# Patient Record
Sex: Female | Born: 1970 | Hispanic: No | Marital: Married | Smoking: Never smoker
Health system: Southern US, Community
[De-identification: ages and names within clinical notes are randomized; demographics above are authoritative.]

## PROBLEM LIST (undated history)

## (undated) DIAGNOSIS — R112 Nausea with vomiting, unspecified: Secondary | ICD-10-CM

## (undated) DIAGNOSIS — D649 Anemia, unspecified: Secondary | ICD-10-CM

## (undated) DIAGNOSIS — D279 Benign neoplasm of unspecified ovary: Secondary | ICD-10-CM

## (undated) DIAGNOSIS — M25569 Pain in unspecified knee: Secondary | ICD-10-CM

## (undated) DIAGNOSIS — R102 Pelvic and perineal pain: Secondary | ICD-10-CM

## (undated) DIAGNOSIS — R109 Unspecified abdominal pain: Secondary | ICD-10-CM

## (undated) DIAGNOSIS — Z9889 Other specified postprocedural states: Secondary | ICD-10-CM

## (undated) DIAGNOSIS — R55 Syncope and collapse: Secondary | ICD-10-CM

## (undated) DIAGNOSIS — N939 Abnormal uterine and vaginal bleeding, unspecified: Secondary | ICD-10-CM

## (undated) DIAGNOSIS — T4145XA Adverse effect of unspecified anesthetic, initial encounter: Secondary | ICD-10-CM

## (undated) DIAGNOSIS — N200 Calculus of kidney: Secondary | ICD-10-CM

## (undated) DIAGNOSIS — G8929 Other chronic pain: Secondary | ICD-10-CM

## (undated) HISTORY — DX: Unspecified abdominal pain: R10.9

## (undated) HISTORY — PX: ABDOMINAL HYSTERECTOMY: SHX81

## (undated) HISTORY — DX: Calculus of kidney: N20.0

## (undated) HISTORY — DX: Abnormal uterine and vaginal bleeding, unspecified: N93.9

## (undated) HISTORY — DX: Pelvic and perineal pain: R10.2

## (undated) HISTORY — PX: HAND TENODESIS: SHX977

## (undated) HISTORY — DX: Syncope and collapse: R55

## (undated) HISTORY — DX: Benign neoplasm of unspecified ovary: D27.9

## (undated) HISTORY — DX: Other chronic pain: G89.29

---

## 2009-03-10 ENCOUNTER — Emergency Department: Payer: Self-pay | Admitting: Emergency Medicine

## 2010-08-15 ENCOUNTER — Ambulatory Visit: Payer: Self-pay | Admitting: Specialist

## 2011-02-02 ENCOUNTER — Ambulatory Visit: Payer: Self-pay | Admitting: Specialist

## 2011-02-05 LAB — PATHOLOGY REPORT

## 2013-02-08 ENCOUNTER — Inpatient Hospital Stay (HOSPITAL_COMMUNITY)
Admission: AD | Admit: 2013-02-08 | Discharge: 2013-02-08 | Disposition: A | Discharge status: Hospice | Payer: Federal, State, Local not specified - PPO | Source: Ambulatory Visit | Attending: Obstetrics & Gynecology | Admitting: Obstetrics & Gynecology

## 2013-02-08 DIAGNOSIS — N938 Other specified abnormal uterine and vaginal bleeding: Secondary | ICD-10-CM | POA: Insufficient documentation

## 2013-02-08 LAB — CBC
HCT: 24.3 % — ABNORMAL LOW (ref 36.0–46.0)
MCHC: 30.5 g/dL (ref 30.0–36.0)
RDW: 25.2 % — ABNORMAL HIGH (ref 11.5–15.5)
WBC: 6.8 10*3/uL (ref 4.0–10.5)

## 2013-02-08 LAB — POCT PREGNANCY, URINE: Preg Test, Ur: NEGATIVE

## 2013-02-08 MED ORDER — MEGESTROL ACETATE 40 MG PO TABS: 120.0000 mg | ORAL_TABLET | Freq: Every day | ORAL | Status: DC

## 2013-02-08 NOTE — MAU Note (Signed)
Pt states she had surgery on the 27th for excess vag bleeding and returned to [facility] on Mar 6th for continued bleeding.  Was told to come to [facility] if bleeding continued.  Says she is passing clots and changing pad 7X /day.

## 2013-02-08 NOTE — MAU Note (Signed)
Pt report she has been bleeding since November. Had Surgery (pt unsure what kind of surgery possibly a  D&C) on 3/6 at [facility]. Stated she still is having vaginal bleeding and feels weak. Went back to [facility] ER on Friday because she felt weak  They told her everything was OK. Pt still feeling weak and having bleeding. Pt reprots chnging her pad about 7 times/ day with clots (quarte/halfdollar sized)

## 2013-02-08 NOTE — MAU Provider Note (Signed)
History     CSN: [id number]  Arrival date and time: 02/08/13 1756   First Provider Initiated Contact with Patient 02/08/13 1910      Chief Complaint  Patient presents with  . Vaginal Bleeding   HPI [name] is a 42 y.o. female who presents to MAU today with complaint of vaginal bleeding. The patient states that she had surgery to help with heavy vaginal bleeding on 01/29/13 at [facility]. She returned to [facility] for evaluation in the ED on 02/05/13 because she was still bleeding and feeling weak. She was told that her bloodwork looked ok and she should follow-up in the office. She states that she has been unable to contact anyone in her GYN office for assistance. She states that today she is still bleeding, with some clots, and having lower abdominal pain. She rates her pain at 6/10 now. She is taking ibuprofen 600 mg, iron and provera that were prescribed in the ED. She feels weak, dizzy and tired.   OB History   No data available      No past medical history on file.  No past surgical history on file.  No family history on file.  History  Substance Use Topics  . Smoking status: Not on file  . Smokeless tobacco: Not on file  . Alcohol Use: Not on file    Allergies: No Known Allergies  Prescriptions prior to admission  Medication Sig Dispense Refill  . ferrous sulfate 325 (65 FE) MG tablet Take 325 mg by mouth daily.      Marland Kitchen ibuprofen (ADVIL,MOTRIN) 600 MG tablet Take 600 mg by mouth every 6 (six) hours as needed for pain.      . [DISCONTINUED] medroxyPROGESTERone (PROVERA) 10 MG tablet Take 10 mg by mouth daily.        Review of Systems  Constitutional: Positive for malaise/fatigue. Negative for fever.  Gastrointestinal: Positive for abdominal pain. Negative for nausea, vomiting, diarrhea and constipation.  Genitourinary: Negative for dysuria, urgency and frequency.       + vaginal bleeding  Musculoskeletal: Negative for back pain.  Neurological: Positive for  dizziness and weakness. Negative for loss of consciousness.   Physical Exam   Blood pressure 124/64, pulse 75, temperature 99.1 F (37.3 C), temperature source Oral, resp. rate 16, height 5' (1.524 m), weight 113 lb 12.8 oz (51.619 kg).  Physical Exam  Constitutional: She is oriented to person, place, and time. She appears well-developed and well-nourished. No distress.  HENT:  Head: Normocephalic and atraumatic.  Cardiovascular: Normal rate, regular rhythm and normal heart sounds.   Respiratory: Effort normal and breath sounds normal. No respiratory distress.  GI: Soft. Bowel sounds are normal. She exhibits no distension and no mass. There is tenderness (mild tenderness to palpation of the lower abdomen). There is no rebound and no guarding.  Genitourinary:  Small amount of bleeding present on pad after ~ 30 minutes  Neurological: She is alert and oriented to person, place, and time.  Skin: Skin is warm and dry. No erythema. No pallor.  Psychiatric: She has a normal mood and affect.   Results for orders placed during the hospital encounter of 02/08/13 (from the past 24 hour(s))  POCT PREGNANCY, URINE     Status: None   Collection Time    02/08/13  6:29 PM      Result Value Range   Preg Test, Ur NEGATIVE  NEGATIVE  CBC     Status: Abnormal   Collection Time  02/08/13  7:30 PM      Result Value Range   WBC 6.8  4.0 - 10.5 K/uL   RBC 3.35 (*) 3.87 - 5.11 MIL/uL   Hemoglobin 7.4 (*) 12.0 - 15.0 g/dL   HCT 40.9 (*) 81.1 - 91.4 %   MCV 72.5 (*) 78.0 - 100.0 fL   MCH 22.1 (*) 26.0 - 34.0 pg   MCHC 30.5  30.0 - 36.0 g/dL   RDW 78.2 (*) 95.6 - 21.3 %   Platelets 308  150 - 400 K/uL    MAU Course  Procedures None  MDM Discussed patient with Dr Despina Hidden. He recommends 3 (40 mg) tablets of Megace daily until follow-up with surgeon. Call surgeon ASAP to make an appointment.   Assessment and Plan  A: DUB  P: Discharge home Rx for Megace sent to patient's pharmacy Patient  encouraged to increase ferrous sulfate to TID Recommended patient fill previously prescribed Rx for colace and take scheduled once she increases her iron Patient encouraged to call surgeon's office and schedule follow-up ASAP Patient advised to go to [facility] or MAU if her symptoms were to change or worsen  Freddi Starr, PA-C  02/08/2013, 8:22 PM

## 2013-02-25 DIAGNOSIS — N926 Irregular menstruation, unspecified: Secondary | ICD-10-CM | POA: Insufficient documentation

## 2013-10-08 DIAGNOSIS — R55 Syncope and collapse: Secondary | ICD-10-CM

## 2013-10-08 DIAGNOSIS — R109 Unspecified abdominal pain: Secondary | ICD-10-CM

## 2013-10-08 HISTORY — DX: Unspecified abdominal pain: R10.9

## 2013-10-08 HISTORY — DX: Syncope and collapse: R55

## 2015-01-23 DIAGNOSIS — D270 Benign neoplasm of right ovary: Secondary | ICD-10-CM | POA: Diagnosis present

## 2015-01-23 DIAGNOSIS — D4959 Neoplasm of unspecified behavior of other genitourinary organ: Secondary | ICD-10-CM | POA: Insufficient documentation

## 2015-01-25 DIAGNOSIS — N200 Calculus of kidney: Secondary | ICD-10-CM | POA: Insufficient documentation

## 2015-01-25 HISTORY — DX: Calculus of kidney: N20.0

## 2015-02-04 ENCOUNTER — Ambulatory Visit: Payer: Self-pay | Admitting: Unknown Physician Specialty

## 2015-03-24 DIAGNOSIS — N939 Abnormal uterine and vaginal bleeding, unspecified: Secondary | ICD-10-CM

## 2015-03-24 HISTORY — DX: Abnormal uterine and vaginal bleeding, unspecified: N93.9

## 2015-04-06 DIAGNOSIS — G8929 Other chronic pain: Secondary | ICD-10-CM | POA: Diagnosis present

## 2015-04-06 DIAGNOSIS — R102 Pelvic and perineal pain: Secondary | ICD-10-CM

## 2015-04-06 DIAGNOSIS — IMO0002 Reserved for concepts with insufficient information to code with codable children: Secondary | ICD-10-CM | POA: Diagnosis present

## 2015-04-06 DIAGNOSIS — D279 Benign neoplasm of unspecified ovary: Secondary | ICD-10-CM | POA: Diagnosis present

## 2015-04-06 DIAGNOSIS — N920 Excessive and frequent menstruation with regular cycle: Secondary | ICD-10-CM | POA: Diagnosis present

## 2015-04-06 HISTORY — DX: Benign neoplasm of unspecified ovary: D27.9

## 2015-04-06 HISTORY — DX: Pelvic and perineal pain: R10.2

## 2015-04-06 HISTORY — DX: Other chronic pain: G89.29

## 2015-04-06 NOTE — H&P (Addendum)
Gail [name] is a 44 y.o. female scheduled for a LAVH / BSO   . Pt with a 2 yr h/o of pelvic pain bilateral Which is worsening recently . Pt states that she has worsening menorrhagia and bleeds 2-3 x/ month and passes large clots . Records state that she underwent ablation ( ED records) but pt denies . Several pelvic u/s and ctscan that show probable rt dermoid and possible adhesed left ovary to posterior utx . Pt has not engaged in sexual activity for 2 yrs because of bleeding  EMBX : negative   Past Medical History:  has a past medical history of Gallstones and Anemia.  Past Surgical History:  has past surgical history that includes Cesarean section; Hysteroscopy; Dilation and curettage of uterus; and Endometrial ablation. Family History: family history includes Hypertension in her brother, father, mother, and sister. Social History:  reports that she has never smoked. She does not have any smokeless tobacco history on file. She reports that she does not drink alcohol or use illicit drugs. OB/GYN History:  OB History    Gravida Para Term Preterm AB TAB SAB Ectopic Multiple Living   2 2 2       1       Allergies: is allergic to ciprodex. Medications:  Current outpatient prescriptions:  . gabapentin (NEURONTIN) 100 MG capsule, , Disp: , Rfl: 2 . oxyCODONE-acetaminophen (PERCOCET) 5-325 mg tablet, , Disp: , Rfl: 0 . sulfamethoxazole-trimethoprim (BACTRIM DS) 800-160 mg tablet, Take 1 tablet by mouth 2 (two) times daily., Disp: , Rfl:   Review of Systems: General: No fatigue or weight loss Eyes:No vision changes Ears:No hearing difficulty Respiratory: No cough or shortness of breath Pulmonary: No asthma or shortness of breath Cardiovascular: No chest pain, palpitations, dyspnea on exertion Gastrointestinal: No abdominal bloating,  chronic diarrhea, constipations, masses, pain or hematochezia Genitourinary:No hematuria, dysuria, abnormal vaginal discharge, pelvic pain, Menometrorrhagia Lymphatic:No swollen lymph nodes Musculoskeletal:No muscle weakness Neurologic:No extremity weakness, syncope, seizure disorder Psychiatric:No history of depression, delusions or suicidal/homicidal ideation   Exam:   Filed Vitals:   03/25/15 1104  BP: 131/84  Pulse: 62    Body mass index is 22.12 kg/(m^2).   WDWN asian female in NAD  Lungs: CTA  CV : RRR without murmur   Neck: no thyromegaly Abdomen: soft , no mass, normal active bowel sounds, non-tender, no rebound tenderness Pelvic: tanner stage 5 ,  External genitalia: vulva /labia no lesions Urethra: no prolapse Vagina: 3 cc blood  Cervix: no lesions, no cervical motion tenderness  Uterus: normal size shape and contour, tender Adnexa: no mass, bilat tender  Rectovaginal:deferred   Impression:   The primary encounter diagnosis was Female pelvic pain. Diagnoses of Menorrhagia with irregular cycle and Dermoid cyst of ovary, right were also pertinent to this visit.    Plan:    Recommend definitive surgery ie LAVH/ BSO Benefits and risks to surgery: The proposed benefit of the surgery has been discussed with the patient. The possible risks include, but are not limited to: organ injury to the bowel , bladder, ureters, and major blood vessels and nerves. There is a possibility of additional surgeries resulting from these injuries. There is also the risk of blood transfusion and the need to receive blood products during or after the procedure which may rarely lead to HIV or Hepatitis C infection. There is a risk of developing a deep venous thrombosis or a pulmonary embolism . There is the possibility of wound infection and also anesthetic complications, even  the  rare possibility of death. The patient understands these risks and wishes to proceed. All questions have been answered . Pt is aware of surgical menopause with removal of ovaries and the potential risks that may accompany.   Caroline Sauger, MD

## 2015-04-07 ENCOUNTER — Encounter
Admission: RE | Admit: 2015-04-07 | Discharge: 2015-04-07 | Disposition: A | Discharge status: Home / Self Care | Payer: Federal, State, Local not specified - PPO | Source: Ambulatory Visit | Attending: Obstetrics and Gynecology | Admitting: Obstetrics and Gynecology

## 2015-04-07 ENCOUNTER — Other Ambulatory Visit: Payer: Self-pay

## 2015-04-07 DIAGNOSIS — N920 Excessive and frequent menstruation with regular cycle: Secondary | ICD-10-CM | POA: Insufficient documentation

## 2015-04-07 DIAGNOSIS — R079 Chest pain, unspecified: Secondary | ICD-10-CM | POA: Diagnosis not present

## 2015-04-07 DIAGNOSIS — N949 Unspecified condition associated with female genital organs and menstrual cycle: Secondary | ICD-10-CM | POA: Insufficient documentation

## 2015-04-07 DIAGNOSIS — G8929 Other chronic pain: Secondary | ICD-10-CM | POA: Insufficient documentation

## 2015-04-07 DIAGNOSIS — Z01812 Encounter for preprocedural laboratory examination: Secondary | ICD-10-CM | POA: Diagnosis present

## 2015-04-07 DIAGNOSIS — Z01810 Encounter for preprocedural cardiovascular examination: Secondary | ICD-10-CM | POA: Insufficient documentation

## 2015-04-07 DIAGNOSIS — N941 Dyspareunia: Secondary | ICD-10-CM | POA: Diagnosis not present

## 2015-04-07 HISTORY — DX: Anemia, unspecified: D64.9

## 2015-04-07 HISTORY — DX: Adverse effect of unspecified anesthetic, initial encounter: T41.45XA

## 2015-04-07 LAB — CBC
HCT: 31.8 % — ABNORMAL LOW (ref 35.0–47.0)
HEMOGLOBIN: 9.9 g/dL — AB (ref 12.0–16.0)
MCH: 24.6 pg — AB (ref 26.0–34.0)
MCHC: 31.2 g/dL — ABNORMAL LOW (ref 32.0–36.0)
MCV: 78.7 fL — ABNORMAL LOW (ref 80.0–100.0)
PLATELETS: 310 10*3/uL (ref 150–440)
RBC: 4.04 MIL/uL (ref 3.80–5.20)
RDW: 14.4 % (ref 11.5–14.5)
WBC: 5.5 10*3/uL (ref 3.6–11.0)

## 2015-04-07 LAB — BASIC METABOLIC PANEL
Anion gap: 5 (ref 5–15)
BUN: 12 mg/dL (ref 6–20)
CALCIUM: 9.1 mg/dL (ref 8.9–10.3)
CHLORIDE: 107 mmol/L (ref 101–111)
CO2: 27 mmol/L (ref 22–32)
CREATININE: 0.74 mg/dL (ref 0.44–1.00)
GFR calc Af Amer: >60 mL/min (ref >60)
Glucose, Bld: 91 mg/dL (ref 65–99)
POTASSIUM: 3.9 mmol/L (ref 3.5–5.1)
SODIUM: 139 mmol/L (ref 135–145)

## 2015-04-07 LAB — TYPE AND SCREEN
ABO/RH(D): O POS
Antibody Screen: NEGATIVE

## 2015-04-10 LAB — ABO/RH: ABO/RH(D): O POS

## 2015-04-11 NOTE — OR Nursing (Addendum)
ekg to anesthesia 04/07/15 EKG ok per anesthesia and message left for Pam at Dr Ouida Sills

## 2015-04-15 ENCOUNTER — Encounter
Admission: RE | Disposition: A | Discharge status: Home / Self Care | Payer: Self-pay | Source: Ambulatory Visit | Attending: Obstetrics and Gynecology

## 2015-04-15 ENCOUNTER — Inpatient Hospital Stay: Payer: Federal, State, Local not specified - PPO | Admitting: Anesthesiology

## 2015-04-15 ENCOUNTER — Observation Stay
Admission: RE | Admit: 2015-04-15 | Discharge: 2015-04-16 | Disposition: A | Discharge status: Home / Self Care | Payer: Federal, State, Local not specified - PPO | Source: Ambulatory Visit | Attending: Obstetrics and Gynecology | Admitting: Obstetrics and Gynecology

## 2015-04-15 ENCOUNTER — Encounter: Payer: Self-pay | Admitting: Anesthesiology

## 2015-04-15 DIAGNOSIS — N941 Dyspareunia: Secondary | ICD-10-CM | POA: Insufficient documentation

## 2015-04-15 DIAGNOSIS — N72 Inflammatory disease of cervix uteri: Secondary | ICD-10-CM | POA: Insufficient documentation

## 2015-04-15 DIAGNOSIS — N888 Other specified noninflammatory disorders of cervix uteri: Secondary | ICD-10-CM | POA: Diagnosis not present

## 2015-04-15 DIAGNOSIS — Z8249 Family history of ischemic heart disease and other diseases of the circulatory system: Secondary | ICD-10-CM | POA: Insufficient documentation

## 2015-04-15 DIAGNOSIS — D279 Benign neoplasm of unspecified ovary: Secondary | ICD-10-CM | POA: Diagnosis present

## 2015-04-15 DIAGNOSIS — N920 Excessive and frequent menstruation with regular cycle: Principal | ICD-10-CM | POA: Insufficient documentation

## 2015-04-15 DIAGNOSIS — D270 Benign neoplasm of right ovary: Secondary | ICD-10-CM | POA: Insufficient documentation

## 2015-04-15 DIAGNOSIS — Z9889 Other specified postprocedural states: Secondary | ICD-10-CM

## 2015-04-15 DIAGNOSIS — Z888 Allergy status to other drugs, medicaments and biological substances status: Secondary | ICD-10-CM | POA: Diagnosis not present

## 2015-04-15 DIAGNOSIS — Z79899 Other long term (current) drug therapy: Secondary | ICD-10-CM | POA: Diagnosis not present

## 2015-04-15 DIAGNOSIS — N831 Corpus luteum cyst: Secondary | ICD-10-CM | POA: Diagnosis not present

## 2015-04-15 DIAGNOSIS — N921 Excessive and frequent menstruation with irregular cycle: Secondary | ICD-10-CM

## 2015-04-15 DIAGNOSIS — G8929 Other chronic pain: Secondary | ICD-10-CM | POA: Diagnosis not present

## 2015-04-15 DIAGNOSIS — N800 Endometriosis of uterus: Secondary | ICD-10-CM | POA: Insufficient documentation

## 2015-04-15 DIAGNOSIS — R102 Pelvic and perineal pain: Secondary | ICD-10-CM | POA: Insufficient documentation

## 2015-04-15 DIAGNOSIS — IMO0002 Reserved for concepts with insufficient information to code with codable children: Secondary | ICD-10-CM | POA: Diagnosis present

## 2015-04-15 HISTORY — PX: CYSTOSCOPY: SHX5120

## 2015-04-15 HISTORY — PX: LAPAROSCOPIC ASSISTED VAGINAL HYSTERECTOMY: SHX5398

## 2015-04-15 HISTORY — PX: LAPAROSCOPIC SALPINGO OOPHERECTOMY: SHX5927

## 2015-04-15 LAB — POCT PREGNANCY, URINE: Preg Test, Ur: NEGATIVE

## 2015-04-15 SURGERY — HYSTERECTOMY, VAGINAL, LAPAROSCOPY-ASSISTED
Anesthesia: General

## 2015-04-15 MED ORDER — ACETAMINOPHEN 10 MG/ML IV SOLN
INTRAVENOUS | Status: AC
  Filled 2015-04-15: qty 100

## 2015-04-15 MED ORDER — FAMOTIDINE 20 MG PO TABS
20.0000 mg | ORAL_TABLET | Freq: Once | ORAL | Status: AC
  Administered 2015-04-15: 20 mg via ORAL

## 2015-04-15 MED ORDER — FENTANYL CITRATE (PF) 100 MCG/2ML IJ SOLN
25.0000 ug | INTRAMUSCULAR | Status: AC | PRN
  Administered 2015-04-15 (×4): 25 ug via INTRAVENOUS

## 2015-04-15 MED ORDER — LACTATED RINGERS IV SOLN
INTRAVENOUS | Status: DC
  Administered 2015-04-15 (×2): via INTRAVENOUS

## 2015-04-15 MED ORDER — ROCURONIUM BROMIDE 100 MG/10ML IV SOLN
INTRAVENOUS | Status: DC | PRN
  Administered 2015-04-15: 5 mg via INTRAVENOUS
  Administered 2015-04-15: 25 mg via INTRAVENOUS
  Administered 2015-04-15: 5 mg via INTRAVENOUS
  Administered 2015-04-15: 10 mg via INTRAVENOUS

## 2015-04-15 MED ORDER — IBUPROFEN 600 MG PO TABS
600.0000 mg | ORAL_TABLET | Freq: Four times a day (QID) | ORAL | Status: DC | PRN
  Administered 2015-04-15 – 2015-04-16 (×4): 600 mg via ORAL
  Filled 2015-04-15 (×4): qty 1

## 2015-04-15 MED ORDER — SILVER NITRATE-POT NITRATE 75-25 % EX MISC
CUTANEOUS | Status: AC
  Filled 2015-04-15: qty 4

## 2015-04-15 MED ORDER — PROPOFOL 10 MG/ML IV BOLUS
INTRAVENOUS | Status: DC | PRN
  Administered 2015-04-15: 100 mg via INTRAVENOUS

## 2015-04-15 MED ORDER — ONDANSETRON HCL 4 MG/2ML IJ SOLN: 4.0000 mg | Freq: Once | INTRAMUSCULAR | Status: DC | PRN

## 2015-04-15 MED ORDER — SUCCINYLCHOLINE CHLORIDE 20 MG/ML IJ SOLN
INTRAMUSCULAR | Status: DC | PRN
  Administered 2015-04-15: 100 mg via INTRAVENOUS

## 2015-04-15 MED ORDER — FENTANYL CITRATE (PF) 100 MCG/2ML IJ SOLN
INTRAMUSCULAR | Status: AC
  Administered 2015-04-15: 25 ug via INTRAVENOUS
  Filled 2015-04-15: qty 2

## 2015-04-15 MED ORDER — ROCURONIUM BROMIDE 100 MG/10ML IV SOLN: INTRAVENOUS | Status: DC | PRN

## 2015-04-15 MED ORDER — NEOSTIGMINE METHYLSULFATE 10 MG/10ML IV SOLN
INTRAVENOUS | Status: DC | PRN
  Administered 2015-04-15: 2 mg via INTRAVENOUS

## 2015-04-15 MED ORDER — ACETAMINOPHEN 500 MG PO TABS
1000.0000 mg | ORAL_TABLET | Freq: Four times a day (QID) | ORAL | Status: DC | PRN
  Administered 2015-04-15: 1000 mg via ORAL
  Filled 2015-04-15: qty 2

## 2015-04-15 MED ORDER — FENTANYL CITRATE (PF) 100 MCG/2ML IJ SOLN
INTRAMUSCULAR | Status: DC | PRN
  Administered 2015-04-15: 50 ug via INTRAVENOUS
  Administered 2015-04-15 (×2): 25 ug via INTRAVENOUS
  Administered 2015-04-15 (×3): 50 ug via INTRAVENOUS

## 2015-04-15 MED ORDER — FLUORESCEIN SODIUM 10 % IJ SOLN
INTRAMUSCULAR | Status: AC
  Administered 2015-04-15: 150 mg via INTRAVENOUS
  Filled 2015-04-15: qty 5

## 2015-04-15 MED ORDER — DEXAMETHASONE SODIUM PHOSPHATE 4 MG/ML IJ SOLN
INTRAMUSCULAR | Status: DC | PRN
  Administered 2015-04-15: 4 mg via INTRAVENOUS

## 2015-04-15 MED ORDER — MEPERIDINE HCL 50 MG PO TABS
100.0000 mg | ORAL_TABLET | ORAL | Status: DC | PRN
  Administered 2015-04-15: 100 mg via ORAL
  Administered 2015-04-15 (×2): 50 mg via ORAL
  Administered 2015-04-15: 100 mg via ORAL
  Administered 2015-04-16 (×3): 50 mg via ORAL
  Administered 2015-04-16: 100 mg via ORAL
  Administered 2015-04-16 (×2): 50 mg via ORAL
  Filled 2015-04-15: qty 2
  Filled 2015-04-15: qty 1
  Filled 2015-04-15: qty 2
  Filled 2015-04-15 (×4): qty 1
  Filled 2015-04-15 (×2): qty 2

## 2015-04-15 MED ORDER — SODIUM CHLORIDE 0.9 % IR SOLN
Status: DC | PRN
  Administered 2015-04-15: 3500 mL via INTRAVESICAL

## 2015-04-15 MED ORDER — ONDANSETRON HCL 4 MG/2ML IJ SOLN
INTRAMUSCULAR | Status: DC | PRN
  Administered 2015-04-15: 4 mg via INTRAVENOUS

## 2015-04-15 MED ORDER — LIDOCAINE HCL (CARDIAC) 20 MG/ML IV SOLN
INTRAVENOUS | Status: DC | PRN
  Administered 2015-04-15: 40 mg via INTRAVENOUS

## 2015-04-15 MED ORDER — CEFOXITIN SODIUM-DEXTROSE 2-2.2 GM-% IV SOLR (PREMIX): 2.0000 g | INTRAVENOUS | Status: AC

## 2015-04-15 MED ORDER — PROMETHAZINE HCL 25 MG PO TABS: 25.0000 mg | ORAL_TABLET | ORAL | Status: DC | PRN

## 2015-04-15 MED ORDER — GLYCOPYRROLATE 0.2 MG/ML IJ SOLN
INTRAMUSCULAR | Status: DC | PRN
  Administered 2015-04-15: 0.4 mg via INTRAVENOUS

## 2015-04-15 MED ORDER — LIDOCAINE-EPINEPHRINE 1 %-1:100000 IJ SOLN
INTRAMUSCULAR | Status: AC
  Filled 2015-04-15: qty 1

## 2015-04-15 MED ORDER — EPHEDRINE SULFATE 50 MG/ML IJ SOLN
INTRAMUSCULAR | Status: DC | PRN
  Administered 2015-04-15: 5 mg via INTRAVENOUS

## 2015-04-15 MED ORDER — LACTATED RINGERS IV SOLN
INTRAVENOUS | Status: DC
  Administered 2015-04-15: 14:00:00 via INTRAVENOUS

## 2015-04-15 MED ORDER — ACETAMINOPHEN 10 MG/ML IV SOLN
INTRAVENOUS | Status: DC | PRN
  Administered 2015-04-15: 1000 mg via INTRAVENOUS

## 2015-04-15 MED ORDER — BUPIVACAINE HCL 0.5 % IJ SOLN
INTRAMUSCULAR | Status: DC | PRN
  Administered 2015-04-15: 10 mL

## 2015-04-15 MED ORDER — MIDAZOLAM HCL 2 MG/2ML IJ SOLN
INTRAMUSCULAR | Status: DC | PRN
  Administered 2015-04-15: 2 mg via INTRAVENOUS

## 2015-04-15 MED ORDER — LIDOCAINE-EPINEPHRINE 1 %-1:100000 IJ SOLN
INTRAMUSCULAR | Status: DC | PRN
  Administered 2015-04-15: 10 mL

## 2015-04-15 MED ORDER — BUPIVACAINE HCL (PF) 0.5 % IJ SOLN
INTRAMUSCULAR | Status: AC
  Filled 2015-04-15: qty 30

## 2015-04-15 MED ORDER — METOCLOPRAMIDE HCL 5 MG/ML IJ SOLN
INTRAMUSCULAR | Status: DC | PRN
  Administered 2015-04-15: 10 mg via INTRAVENOUS

## 2015-04-15 MED ORDER — FENTANYL CITRATE (PF) 100 MCG/2ML IJ SOLN: INTRAMUSCULAR | Status: DC | PRN

## 2015-04-15 MED ORDER — CEFOXITIN SODIUM-DEXTROSE 2-2.2 GM-% IV SOLR (PREMIX)
INTRAVENOUS | Status: AC
  Administered 2015-04-15: 2000 mg via INTRAVENOUS
  Filled 2015-04-15: qty 50

## 2015-04-15 SURGICAL SUPPLY — 56 items
APPLICATOR ARISTA FLEXITIP XL (MISCELLANEOUS) ×4 IMPLANT
BAG URO DRAIN 2000ML W/SPOUT (MISCELLANEOUS) ×4 IMPLANT
BLADE SURG SZ11 CARB STEEL (BLADE) ×4 IMPLANT
CANISTER SUCT 1200ML W/VALVE (MISCELLANEOUS) ×4 IMPLANT
CATH FOLEY 2WAY  5CC 16FR (CATHETERS) ×2
CATH ROBINSON RED A/P 16FR (CATHETERS) IMPLANT
CATH URTH 16FR FL 2W BLN LF (CATHETERS) ×2 IMPLANT
CHLORAPREP W/TINT 26ML (MISCELLANEOUS) ×4 IMPLANT
CLOSURE WOUND 1/2 X4 (GAUZE/BANDAGES/DRESSINGS) ×1
CLOSURE WOUND 1/4X4 (GAUZE/BANDAGES/DRESSINGS) ×1
DRAPE SHEET LG 3/4 BI-LAMINATE (DRAPES) ×4 IMPLANT
DRSG TEGADERM 2-3/8X2-3/4 SM (GAUZE/BANDAGES/DRESSINGS) ×4 IMPLANT
ENDOPOUCH RETRIEVER 10 (MISCELLANEOUS) IMPLANT
GAUZE SPONGE NON-WVN 2X2 STRL (MISCELLANEOUS) ×2 IMPLANT
GLOVE BIO SURGEON STRL SZ8 (GLOVE) ×16 IMPLANT
GOWN STRL REUS W/ TWL LRG LVL3 (GOWN DISPOSABLE) ×6 IMPLANT
GOWN STRL REUS W/ TWL XL LVL3 (GOWN DISPOSABLE) ×2 IMPLANT
GOWN STRL REUS W/TWL LRG LVL3 (GOWN DISPOSABLE) ×6
GOWN STRL REUS W/TWL XL LVL3 (GOWN DISPOSABLE) ×2
HEMOSTAT ARISTA ABSORB 3G PWDR (MISCELLANEOUS) ×4 IMPLANT
IRRIGATION STRYKERFLOW (MISCELLANEOUS) ×2 IMPLANT
IRRIGATOR STRYKERFLOW (MISCELLANEOUS) ×4
IV LACTATED RINGERS 1000ML (IV SOLUTION) ×4 IMPLANT
IV NS 1000ML (IV SOLUTION) ×2
IV NS 1000ML BAXH (IV SOLUTION) ×2 IMPLANT
KIT RM TURNOVER CYSTO AR (KITS) ×4 IMPLANT
LABEL OR SOLS (LABEL) ×4 IMPLANT
LIQUID BAND (GAUZE/BANDAGES/DRESSINGS) ×4 IMPLANT
NS IRRIG 500ML POUR BTL (IV SOLUTION) ×4 IMPLANT
PACK BASIN MINOR ARMC (MISCELLANEOUS) ×4 IMPLANT
PACK GYN LAPAROSCOPIC (MISCELLANEOUS) ×4 IMPLANT
PAD GROUND ADULT SPLIT (MISCELLANEOUS) ×4 IMPLANT
PAD OB MATERNITY 4.3X12.25 (PERSONAL CARE ITEMS) ×4 IMPLANT
PAD PREP 24X41 OB/GYN DISP (PERSONAL CARE ITEMS) ×4 IMPLANT
SCISSORS MNPLR CVD DVNC (INSTRUMENTS) ×2 IMPLANT
SCISSORS MONOPOLAR CVD (INSTRUMENTS) ×2
SET CYSTO W/LG BORE CLAMP LF (SET/KITS/TRAYS/PACK) ×4 IMPLANT
SHEARS HARMONIC ACE PLUS 36CM (ENDOMECHANICALS) ×4 IMPLANT
SPONGE VERSALON 2X2 STRL (MISCELLANEOUS) ×2
STRIP CLOSURE SKIN 1/2X4 (GAUZE/BANDAGES/DRESSINGS) ×3 IMPLANT
STRIP CLOSURE SKIN 1/4X4 (GAUZE/BANDAGES/DRESSINGS) ×3 IMPLANT
SUT PDS AB 2-0 CT1 27 (SUTURE) ×4 IMPLANT
SUT VIC AB 0 CT1 27 (SUTURE) ×4
SUT VIC AB 0 CT1 27XCR 8 STRN (SUTURE) ×4 IMPLANT
SUT VIC AB 0 CT1 36 (SUTURE) ×8 IMPLANT
SUT VIC AB 2-0 UR6 27 (SUTURE) ×4 IMPLANT
SUT VIC AB 4-0 FS2 27 (SUTURE) IMPLANT
SUT VIC AB 4-0 SH 27 (SUTURE) ×2
SUT VIC AB 4-0 SH 27XANBCTRL (SUTURE) ×2 IMPLANT
SWABSTK COMLB BENZOIN TINCTURE (MISCELLANEOUS) ×4 IMPLANT
SYR CONTROL 10ML (SYRINGE) ×4 IMPLANT
TROCAR ENDO BLADELESS 11MM (ENDOMECHANICALS) ×4 IMPLANT
TROCAR XCEL NON-BLD 5MMX100MML (ENDOMECHANICALS) ×4 IMPLANT
TROCAR XCEL UNIV SLVE 11M 100M (ENDOMECHANICALS) ×4 IMPLANT
TUBING INSUFFLATOR HEATED (MISCELLANEOUS) ×4 IMPLANT
TUBING INSUFFLATOR HI FLOW (MISCELLANEOUS) ×4 IMPLANT

## 2015-04-15 NOTE — Anesthesia Preprocedure Evaluation (Signed)
Anesthesia Evaluation  Patient identified by MRN, date of birth, ID band Patient awake    Reviewed: Allergy & Precautions, NPO status , Patient's Chart, lab work & pertinent test results  History of Anesthesia Complications Negative for: history of anesthetic complications  Airway Mallampati: II  TM Distance: >3 FB Neck ROM: Full    Dental  (+) Missing, Teeth Intact   Pulmonary          Cardiovascular     Neuro/Psych    GI/Hepatic   Endo/Other    Renal/GU      Musculoskeletal   Abdominal   Peds  Hematology  (+) anemia ,   Anesthesia Other Findings   Reproductive/Obstetrics                             Anesthesia Physical Anesthesia Plan  ASA: II  Anesthesia Plan: General   Post-op Pain Management:    Induction: Intravenous  Airway Management Planned: Oral ETT  Additional Equipment:   Intra-op Plan:   Post-operative Plan:   Informed Consent: I have reviewed the patients History and Physical, chart, labs and discussed the procedure including the risks, benefits and alternatives for the proposed anesthesia with the patient or authorized representative who has indicated his/her understanding and acceptance.     Plan Discussed with:   Anesthesia Plan Comments:         Anesthesia Quick Evaluation

## 2015-04-15 NOTE — Anesthesia Procedure Notes (Signed)
Procedure Name: Intubation Date/Time: 04/15/2015 7:41 AM Performed by: Vaughan Sine Pre-anesthesia Checklist: Patient identified, Emergency Drugs available, Suction available, Patient being monitored and Timeout performed Patient Re-evaluated:Patient Re-evaluated prior to inductionOxygen Delivery Method: Circle system utilized Preoxygenation: Pre-oxygenation with 100% oxygen Intubation Type: IV induction Ventilation: Mask ventilation without difficulty Laryngoscope Size: Miller and 3 Grade View: Grade II Tube type: Oral Tube size: 6.5 mm Airway Equipment and Method: Stylet Placement Confirmation: ETT inserted through vocal cords under direct vision,  positive ETCO2 and breath sounds checked- equal and bilateral Secured at: 18 cm Tube secured with: Tape

## 2015-04-15 NOTE — Brief Op Note (Signed)
04/15/2015  10:53 AM  PATIENT:  Gail [name]  44 y.o. female  PRE-OPERATIVE DIAGNOSIS:  [place] PAIN  POST-OPERATIVE DIAGNOSIS:  same as preop  PROCEDURE:  Procedure(s): LAPAROSCOPIC ASSISTED VAGINAL HYSTERECTOMY (N/A) LAPAROSCOPIC SALPINGO OOPHORECTOMY (Bilateral) CYSTOSCOPY (N/A)  SURGEON:  Surgeon(s) and Role:    Boykin Nearing, MD - Primary  PHYSICIAN ASSISTANT: Dr Nathaneil Canary   ASSISTANTS:   ANESTHESIA:   general  EBL: 150 cc  IOF : 2500 cc Urine 200 cc  BLOOD ADMINISTERED:none  DRAINS: none   LOCAL MEDICATIONS USED:  MARCAINE     SPECIMEN:  Source of Specimen:  uterus , tubes and ovaries  DISPOSITION OF SPECIMEN:  PATHOLOGY  COUNTS:  YES  TOURNIQUET:  * No tourniquets in log *  DICTATION: .verbal  PLAN OF CARE: Admit for overnight observation  PATIENT DISPOSITION:  23 observation    Delay start of Pharmacological VTE agent (>24hrs) due to surgical blood loss or risk of bleeding: no

## 2015-04-15 NOTE — Anesthesia Postprocedure Evaluation (Signed)
  Anesthesia Post-op Note  Patient: Gail [name]  Procedure(s) Performed: Procedure(s): LAPAROSCOPIC ASSISTED VAGINAL HYSTERECTOMY (N/A) LAPAROSCOPIC SALPINGO OOPHORECTOMY (Bilateral) CYSTOSCOPY (N/A)  Anesthesia type:General  Patient location: PACU  Post pain: Pain level controlled  Post assessment: Post-op Vital signs reviewed, Patient's Cardiovascular Status Stable, Respiratory Function Stable, Patent Airway and No signs of Nausea or vomiting  Post vital signs: Reviewed and stable  Last Vitals:  Filed Vitals:   04/15/15 0613  BP: 135/80  Pulse: 70  Temp: 36.7 C    Level of consciousness: awake, alert  and patient cooperative  Complications: No apparent anesthesia complications

## 2015-04-15 NOTE — Anesthesia Postprocedure Evaluation (Signed)
  Anesthesia Post-op Note  Patient: Gail [name]  Procedure(s) Performed: Procedure(s): LAPAROSCOPIC ASSISTED VAGINAL HYSTERECTOMY (N/A) LAPAROSCOPIC SALPINGO OOPHORECTOMY (Bilateral) CYSTOSCOPY (N/A)  Anesthesia type:General  Patient location: PACU  Post pain: Pain level controlled  Post assessment: Post-op Vital signs reviewed, Patient's Cardiovascular Status Stable, Respiratory Function Stable, Patent Airway and No signs of Nausea or vomiting  Post vital signs: Reviewed and stable  Last Vitals:  Filed Vitals:   04/15/15 1100  BP: 109/61  Pulse: 81  Temp: 36.3 C  Resp: 14    Level of consciousness: awake, alert  and patient cooperative  Complications: No apparent anesthesia complications

## 2015-04-15 NOTE — Progress Notes (Signed)
Patient ID: Gail [name], female   DOB: 1970-12-21, 44 y.o.   MRN: [id number] Negative urine HCG

## 2015-04-15 NOTE — Transfer of Care (Signed)
Immediate Anesthesia Transfer of Care Note  Patient: Gail [name]  Procedure(s) Performed: Procedure(s): LAPAROSCOPIC ASSISTED VAGINAL HYSTERECTOMY (N/A) LAPAROSCOPIC SALPINGO OOPHORECTOMY (Bilateral) CYSTOSCOPY (N/A)  Patient Location: PACU  Anesthesia Type:General  Level of Consciousness: awake and sedated  Airway & Oxygen Therapy: Patient Spontanous Breathing and Patient connected to face mask oxygen  Post-op Assessment: Report given to RN and Post -op Vital signs reviewed and stable  Post vital signs: Reviewed and stable  Last Vitals:  Filed Vitals:   04/15/15 0613  BP: 135/80  Pulse: 70  Temp: 62.4 C    Complications: No apparent anesthesia complications

## 2015-04-15 NOTE — Progress Notes (Signed)
Patient ID: Gail [name], female   DOB: June 12, 1971, 44 y.o.   MRN: [id number] DOS LAVH / BSO  Pain ok  Good urine output  No vaginal bleeding  A; stable  P: advance diet , slow IV rate to 50 cc/ hr and stop once tolerating liquids well ' Cont IS

## 2015-04-15 NOTE — Progress Notes (Signed)
Patient ID: Gail [name], female   DOB: 11-25-1971, 44 y.o.   MRN: [id number] Here for surgery today LAVH/ BSO. Motrin use 2 days ago . NPO today . HCT 31. All questions answered. Pt is ready for surgery

## 2015-04-16 DIAGNOSIS — N920 Excessive and frequent menstruation with regular cycle: Secondary | ICD-10-CM | POA: Diagnosis not present

## 2015-04-16 LAB — CBC
HCT: 28.3 % — ABNORMAL LOW (ref 35.0–47.0)
Hemoglobin: 9 g/dL — ABNORMAL LOW (ref 12.0–16.0)
MCH: 24.8 pg — ABNORMAL LOW (ref 26.0–34.0)
MCHC: 32 g/dL (ref 32.0–36.0)
MCV: 77.6 fL — ABNORMAL LOW (ref 80.0–100.0)
Platelets: 243 10*3/uL (ref 150–440)
RBC: 3.64 MIL/uL — ABNORMAL LOW (ref 3.80–5.20)
RDW: 14.8 % — ABNORMAL HIGH (ref 11.5–14.5)
WBC: 9 10*3/uL (ref 3.6–11.0)

## 2015-04-16 LAB — BASIC METABOLIC PANEL
Anion gap: 4 — ABNORMAL LOW (ref 5–15)
BUN: 9 mg/dL (ref 6–20)
CALCIUM: 8.2 mg/dL — AB (ref 8.9–10.3)
CO2: 26 mmol/L (ref 22–32)
CREATININE: 0.67 mg/dL (ref 0.44–1.00)
Chloride: 109 mmol/L (ref 101–111)
GFR calc Af Amer: >60 mL/min (ref >60)
GFR calc non Af Amer: >60 mL/min (ref >60)
GLUCOSE: 88 mg/dL (ref 65–99)
Potassium: 3.7 mmol/L (ref 3.5–5.1)
SODIUM: 139 mmol/L (ref 135–145)

## 2015-04-16 MED ORDER — ACETAMINOPHEN 500 MG PO TABS: 1000.0000 mg | ORAL_TABLET | Freq: Four times a day (QID) | ORAL | Status: DC | PRN

## 2015-04-16 MED ORDER — AMMONIA AROMATIC IN INHA
RESPIRATORY_TRACT | Status: AC
  Administered 2015-04-16: 17:00:00
  Filled 2015-04-16: qty 10

## 2015-04-16 MED ORDER — MEPERIDINE HCL 50 MG PO TABS: 100.0000 mg | ORAL_TABLET | ORAL | Status: DC | PRN

## 2015-04-16 MED ORDER — NAPROXEN 500 MG PO TABS: 500.0000 mg | ORAL_TABLET | Freq: Two times a day (BID) | ORAL | Status: DC

## 2015-04-16 MED ORDER — SODIUM CHLORIDE 0.9 % IV BOLUS (SEPSIS)
500.0000 mL | Freq: Once | INTRAVENOUS | Status: AC
  Administered 2015-04-16: 500 mL via INTRAVENOUS

## 2015-04-16 MED ORDER — PROMETHAZINE HCL 25 MG PO TABS: 25.0000 mg | ORAL_TABLET | ORAL | Status: DC | PRN

## 2015-04-16 MED ORDER — DOCUSATE SODIUM 100 MG PO CAPS: 100.0000 mg | ORAL_CAPSULE | Freq: Two times a day (BID) | ORAL | Status: DC

## 2015-04-16 NOTE — Progress Notes (Signed)
Pt is asleep at this time.

## 2015-04-16 NOTE — Discharge Planning (Signed)
Pt. D/C to home vvia wheelchair. Accompanied by L. Quentin Cornwall NT and Sig. Other. Pt. Is alert and oriented with pleasant affect. Appears confortable and in NAD.

## 2015-04-16 NOTE — Progress Notes (Signed)
VSS. Pt did have 93-95% sats this morning and at noon but improved with incentive spirometry and coughing and deep breathing. Sats now 100%. Breath sounds clear. Pt tolerating soft diet, has sore throat but otherwise doing well. Pt tolerating demerol for pain and motrin. Pt has discharge orders and is supposed to go home but has not been able to void since foley discontinued at 0930. Pt has been drinking fluids. IV was not discontinued, it was saline locked due to the inability of the pt to void. Pt has been aggressively trying to pee this afternoon and last tried at 1745 with no success.The pt was bladder scanned around 1800 and Dr. Archie Balboa was called. Orders received to in and out cath pt. Pt was in and out cathed at 1815, 245ml drained from bladder. Dr. Archie Balboa stated he would check the output in her chart and to encourage her to drink and to try to void within 3 hours. RN to keep Dr. Archie Balboa updated in regards to voiding status.     Saintclair Halsted, RN

## 2015-04-16 NOTE — Discharge Instructions (Signed)

## 2015-04-16 NOTE — Discharge Summary (Signed)
Dr. Archie Balboa was notified that Pt. Had voided 250cc of clear amber urine independently. Dr. Archie Balboa stated that Pt. Could be D/C'd home of Bladder Scan showed < 200cc of Post-Residual Urine. Pt. Was scanned and there was 0cc of urine I.D.'d on scan. Dr. Archie Balboa also notified of O2 Sat. Of 84% before Pt. Coughed and when Pt. Coughed her Sat. Went up to 97%. Dr. Archie Balboa stated that was fine and Pt. Was to be D/C'd if Bladder Scan showed less than 200cc of Post Residual Urine. Since Pt.'s scan was 0cc she was given D/C Instructions and Prescriptions. Pt. And Sig. Other V/O and therefor Pt. Was D/C'd home in care of her husband.

## 2015-04-16 NOTE — Progress Notes (Signed)
Patient ID: Gail [name], female   DOB: 05/20/1971, 44 y.o.   MRN: [id number] POD#1 pain ok ,  O: VSS  Lungs cta  CV RRR without murmur abd : soft NT , pad - no- blood Incisions healing  LABS wnl hct 28% , cr = 0.67 Good urine output  A; stable  P:D/C home

## 2015-04-16 NOTE — Discharge Summary (Signed)
Physician Discharge Summary  Patient ID: Gail [name] MRN: [id number] DOB/AGE: 1971-08-30 44 y.o.  Admit date: 04/15/2015 Discharge date: 04/16/2015  Admission Diagnoses:chronic pelvic pain ,menorrhagia, rt dermoid ovarian cyst   Discharge Diagnoses: same, s/p LAVH BSO  Active Problems:   Menorrhagia   Chronic pelvic pain in female   Dermoid cyst of ovary   Dyspareunia   Post-operative state   Discharged Condition: good  Hospital Course: uncomplicated post op course . Good urine output , nl Creatinine + HCT post op day #1. Pain in good control  Consults: None  Significant Diagnostic Studies: labs:    Ref Range 5:39 AM  9d ago      Sodium 135 - 145 mmol/L 139 139    Potassium 3.5 - 5.1 mmol/L 3.7 3.9    Chloride 101 - 111 mmol/L 109 107    CO2 22 - 32 mmol/L 26 27    Glucose, Bld 65 - 99 mg/dL 88 91    BUN 6 - 20 mg/dL 9 12    Creatinine, Ser 0.44 - 1.00 mg/dL 0.67 0.74    Calcium 8.9 - 10.3 mg/dL 8.2 (L) 9.1    GFR calc non Af Amer >60 mL/min >60 60 mL/min" class="[email or web address]" style="cursor: pointer;" onmouseover='jscript: var varStyle="underline"; var bgColor="#[id number]"; this.style.backgroundColor=bgColor; var children=this.getElementsByTagName("div"); for(var child=0;child >60    GFR calc Af Amer >60 mL/min >60 60 mL/min" class="[email or web address]" style="cursor: pointer;" onmouseover='jscript: var varStyle="underline"; var bgColor="#[id number]"; this.style.backgroundColor=bgColor; var children=this.getElementsByTagName("div"); for(var child=0;child >60CM   Comments: (NOTE)  The eGFR has been calculated using the CKD EPI equation.  This calculation has not been validated in all clinical situations.  eGFR's persistently <60 mL/min signify possible Chronic Kidney  Disease.      Anion gap 5 - 15  4 (L) 5   Resulting Agency  [facility]      Specimen Collect          Ref Range 5:39 AM  9d ago      WBC 3.6 - 11.0 K/uL 9.0 5.5    RBC 3.80 - 5.20 MIL/uL 3.64  (L) 4.04    Hemoglobin 12.0 - 16.0 g/dL 9.0 (L) 9.9 (L)    HCT 35.0 - 47.0 % 28.3 (L) 31.8 (L)    MCV 80.0 - 100.0 fL 77.6 (L) 78.7 (L)    MCH 26.0 - 34.0 pg 24.8 (L) 24.6 (L)    MCHC 32.0 - 36.0 g/dL 32.0 31.2 (L)    RDW 11.5 - 14.5 % 14.8 (H) 14.4    Platelets 150 - 440 K/uL 243 310   Resulting Agency  [facility]      Specimen Collected: 04/16/15 5:39 AM Last Resulted: 04/16/15 6:01 AM             Treatments:none  Discharge Exam: Blood pressure 97/53, pulse 89, temperature 98.3 F (36.8 C), temperature source Oral, resp. rate 18, height 5' 2"  (1.575 m), weight 53.524 kg (118 lb), last menstrual period 04/01/2015, SpO2 100 %. cv rrr without murmur  Lungs CTA without rales , good breath sounds  Abd: soft NT  Incisions dressing dry Vag pad dry    Disposition: 50-Hospice/Home  Discharge Instructions    Discharge patient    Complete by:  As directed   F[facility]up in 2 weeks with [facility] at [facility]            Medication List    STOP taking these medications        ferrous sulfate 325 (65 FE) MG tablet  ibuprofen 600 MG tablet  Commonly known as:  ADVIL,MOTRIN     megestrol 40 MG tablet  Commonly known as:  MEGACE      TAKE these medications        acetaminophen 500 MG tablet  Commonly known as:  TYLENOL  Take 2 tablets (1,000 mg total) by mouth every 6 (six) hours as needed for fever or headache.     docusate sodium 100 MG capsule  Commonly known as:  COLACE  Take 1 capsule (100 mg total) by mouth 2 (two) times daily.     meperidine 50 MG tablet  Commonly known as:  DEMEROL  Take 2 tablets (100 mg total) by mouth every 4 (four) hours as needed for severe pain.     naproxen 500 MG tablet  Commonly known as:  NAPROSYN  Take 1 tablet (500 mg total) by mouth 2 (two) times daily with a meal.     promethazine 25 MG tablet  Commonly known as:  PHENERGAN  Take 1 tablet (25 mg total) by mouth every 4 (four) hours as needed for nausea or vomiting.            Follow-up Information    Follow up with SCHERMERHORN,THOMAS, MD. Schedule an appointment as soon as possible for a visit in 2 weeks.   Specialty:  Obstetrics and Gynecology   Why:  For wound re-check   Contact information:   [place] [address] [phone]       Signed: [name] 04/16/2015, 7:38 AM

## 2015-04-17 ENCOUNTER — Encounter: Payer: Self-pay | Admitting: Obstetrics and Gynecology

## 2015-04-18 NOTE — Op Note (Signed)
[name], [name] NO.:  [id number]  MEDICAL RECORD NO.:  [id number]  LOCATION:  348A                         FACILITY:  [facility]  PHYSICIAN:  Gail [name] [name], MDDATE OF BIRTH:  Dec 24, 1970  DATE OF ADMISSION:  04/15/2015 DATE OF DISCHARGE:  04/16/2015                            HISTORY AND PHYSICAL   PREOPERATIVE DIAGNOSES: 1. Chronic pelvic pain. 2. Menorrhagia. 3. Right ovarian dermoid.  POSTOPERATIVE DIAGNOSES: 1. Chronic pelvic pain. 2. Menorrhagia. 3. Right ovarian dermoid.  PROCEDURE: 1. Laparoscopic-assisted vaginal hysterectomy. 2. Bilateral salpingo-oophorectomy. 3. Cystoscopy.  ANESTHESIA:  General endotracheal anesthesia.  FIRST ASSISTANT:  Gail [name] Canary, MD  INDICATIONS:  This is a 44 year old gravida 2, para 2 patient with a prior cesarean section.  Patient has a 2-year history of chronic pelvic pain, unrelieved with medical management.  The patient also has significant menorrhagia and patient also has CT scan that is consistent with a right dermoid cyst.  PROCEDURE:  After adequate general endotracheal anesthesia, the patient was placed in dorsal supine position.  The patient's abdomen was prepped and draped in normal sterile fashion.  Vaginal prep was performed as well.  The patient did receive 2 g of IV cefoxitin prior to commencement of the case.  Foley catheter was placed followed by placement of a Hulka tenaculum into the endometrium.  Gloves were changed and attention was direct to the patient's abdomen where a 12 mm infraumbilical incision was made after injecting with 0.5% Marcaine.  The laparoscope was advanced into the abdominal cavity under direct visualization with the Optiview cannula.  Initial impression showed several omental adhesions in the right mid abdomen and right upper abdomen.  An 11 mm port site was placed in the left lower quadrant 3 cm medial to the left anterior iliac spine.  Under direct visualization,  the cannula was placed.  A third port site was placed on the right lower quadrant, again 3 cm medial to the right anterior iliac spine and a 5 mm port was advanced under direct visualization.  Harmonic scalpel was brought up to the operative field.  Some of the filmy adhesions on the right mid portion of the abdomen were removed without difficulty.  The left cornua was grasped with a tenaculum and placed on tension.  The left round ligament was clamped and transected with the Harmonic scalpel, and the infundibulopelvic ligament was then identified, was clamped and transected.  Several bites of the broad ligament were used with Harmonic scalpel to connect the previously-made incision through the round ligament.  Continued dissection down to the broad ligament where the left uterine artery was identified, cauterized with the Kleppinger and then transected with Harmonic scalpel.  Anterior bladder flap was opened with the Harmonic scalpel.  A similar procedure was repeated on the right side.  Again, tenaculum was placed on the right side of the uterus and traction was placed to the left while the round ligament was clamped and transected.  Again, the right infundibulopelvic ligament was identified, cauterized and transected.  A bulbous right ovary, measuring 3 x 3 cm, consistent with a dermoid was noted.  Continued dissection through the broad ligament to the right uterine artery which was cauterized and transected.  There was a  small amount of bleeding on the right side which was controlled with the Kleppingers.  Of note, the left ureteral course was not identified due to inflammation and thickening of the peritoneum on the right; however, the ureter was clearly seen with normal peristaltic activity prior to the dissection of the right side. Continued opening of the bladder flap with Harmonic scalpel and procedure was then stopped.  Attention was then directed vaginally to perform the  vaginal portion of the procedure.  The cervix was circumferentially injected with 0.5% lidocaine with 1:100,000 epinephrine.  Direct posterior colpotomy incision was made.  Once entrance into the posterior cul-de-sac, the long bill weighted speculum was placed.  The uterosacral ligaments were bilaterally clamped, transected, suture ligated with 0 Vicryl suture.  A circumferential incision was made with the Bovie and the anterior cul-de-sac was entered without difficulty.  A Deaver retractor was placed in the anterior cul- de-sac reflecting the bladder anteriorly.  Only 2 clamps were used to clamp the cardinal ligaments.  They were both transected, suture ligated with 0 Vicryl suture and then the cervix, uterus, fallopian tubes and ovaries were removed through the vaginal cuff.  Good hemostasis was noted.  The vaginal cuff was closed with a running 0 Vicryl suture. Good approximation of edges and good hemostasis was noted.  The uterosacral ligaments were plicated centrally and the rest of the vaginal cuff was closed.  Given the inability to see the left ureteral course, cystoscopy was performed.  During the cystoscopy, fluorescein was used, 150 mg.  The left ureteral orifice was clearly identified. The right ureteral orifice was never identified and, after the fluorescein was identified from the left ureter, technical difficulty visualizing during the cystoscopy given the bright color of the fluorescein.  Therefore, the right ureteral orifice was never identified.  Cystoscopy was then stopped and Foley catheter was placed. Attention was directed back up to the laparoscopy where the right ureter was clearly identified with normal peristaltic activity.  The course of the ureter was deemed to be clear of the previously-dissected areas. Some additional omental adhesions were taken down from the mid portion of the abdomen.  Arista was used in the deep part of the posterior cul- de-sac, given to  some raw edges.  Pressure was lowered to 7 mmHg and there was good hemostasis noted.  At this point, the left port site and the infraumbilical port site fascia were closed with 0 Vicryl suture. All instruments were accounted for and were removed.  The patient's abdomen was deflated and each incision was covered with LiquiBand, Telfa and a Tegaderm.  There were no complications.  Estimated blood loss was 150 cc.  Urine output was 250 cc.  The patient tolerated the procedure well and was taken to the recovery room in good condition.          ______________________________ Gail [name] [place], MD     TS/MEDQ  D:  04/15/2015  T:  04/15/2015  Job:  [id number]

## 2015-04-25 LAB — SURGICAL PATHOLOGY

## 2015-07-25 ENCOUNTER — Other Ambulatory Visit: Payer: Self-pay | Admitting: Obstetrics and Gynecology

## 2015-07-25 DIAGNOSIS — R1010 Upper abdominal pain, unspecified: Secondary | ICD-10-CM

## 2015-07-25 DIAGNOSIS — R102 Pelvic and perineal pain: Secondary | ICD-10-CM

## 2015-07-29 ENCOUNTER — Ambulatory Visit
Admission: RE | Admit: 2015-07-29 | Discharge: 2015-07-29 | Disposition: A | Discharge status: Home / Self Care | Payer: Federal, State, Local not specified - PPO | Source: Ambulatory Visit | Attending: Obstetrics and Gynecology | Admitting: Obstetrics and Gynecology

## 2015-07-29 DIAGNOSIS — R102 Pelvic and perineal pain: Secondary | ICD-10-CM | POA: Diagnosis present

## 2015-07-29 DIAGNOSIS — R1010 Upper abdominal pain, unspecified: Secondary | ICD-10-CM | POA: Diagnosis present

## 2015-07-29 DIAGNOSIS — I700 Atherosclerosis of aorta: Secondary | ICD-10-CM | POA: Insufficient documentation

## 2015-07-29 DIAGNOSIS — K8020 Calculus of gallbladder without cholecystitis without obstruction: Secondary | ICD-10-CM | POA: Insufficient documentation

## 2015-07-29 DIAGNOSIS — Z90710 Acquired absence of both cervix and uterus: Secondary | ICD-10-CM | POA: Diagnosis not present

## 2015-07-29 MED ORDER — IOHEXOL 350 MG/ML SOLN
100.0000 mL | Freq: Once | INTRAVENOUS | Status: AC | PRN
  Administered 2015-07-29: 100 mL via INTRAVENOUS

## 2015-10-12 ENCOUNTER — Ambulatory Visit
Admission: RE | Admit: 2015-10-12 | Discharge: 2015-10-12 | Disposition: A | Discharge status: Home / Self Care | Payer: Worker's Compensation | Source: Ambulatory Visit | Attending: Preventive Medicine/Occupational Environmental Medicine | Admitting: Preventive Medicine/Occupational Environmental Medicine

## 2015-10-12 ENCOUNTER — Other Ambulatory Visit: Payer: Self-pay | Admitting: Hospitalist

## 2015-10-12 ENCOUNTER — Other Ambulatory Visit: Payer: Self-pay | Admitting: Preventive Medicine/Occupational Environmental Medicine

## 2015-10-12 ENCOUNTER — Other Ambulatory Visit: Payer: Self-pay | Admitting: *Deleted

## 2015-10-12 ENCOUNTER — Ambulatory Visit
Admission: RE | Admit: 2015-10-12 | Discharge: 2015-10-12 | Disposition: A | Discharge status: Home / Self Care | Payer: Federal, State, Local not specified - PPO | Source: Ambulatory Visit | Attending: Preventive Medicine/Occupational Environmental Medicine | Admitting: Preventive Medicine/Occupational Environmental Medicine

## 2015-10-12 DIAGNOSIS — R52 Pain, unspecified: Secondary | ICD-10-CM

## 2015-10-12 DIAGNOSIS — M25531 Pain in right wrist: Secondary | ICD-10-CM | POA: Diagnosis not present

## 2015-12-28 ENCOUNTER — Emergency Department: Payer: Federal, State, Local not specified - PPO

## 2015-12-28 ENCOUNTER — Emergency Department: Payer: Federal, State, Local not specified - PPO | Admitting: Anesthesiology

## 2015-12-28 ENCOUNTER — Encounter
Admission: EM | Disposition: A | Discharge status: Home / Self Care | Payer: Self-pay | Source: Home / Self Care | Attending: Emergency Medicine

## 2015-12-28 ENCOUNTER — Emergency Department
Admission: EM | Admit: 2015-12-28 | Discharge: 2015-12-28 | Disposition: A | Discharge status: Home / Self Care | Payer: Federal, State, Local not specified - PPO | Attending: Emergency Medicine | Admitting: Emergency Medicine

## 2015-12-28 ENCOUNTER — Encounter: Payer: Self-pay | Admitting: Emergency Medicine

## 2015-12-28 DIAGNOSIS — Z881 Allergy status to other antibiotic agents status: Secondary | ICD-10-CM | POA: Diagnosis not present

## 2015-12-28 DIAGNOSIS — G8929 Other chronic pain: Secondary | ICD-10-CM | POA: Insufficient documentation

## 2015-12-28 DIAGNOSIS — R112 Nausea with vomiting, unspecified: Secondary | ICD-10-CM | POA: Insufficient documentation

## 2015-12-28 DIAGNOSIS — R1032 Left lower quadrant pain: Secondary | ICD-10-CM | POA: Diagnosis not present

## 2015-12-28 DIAGNOSIS — R509 Fever, unspecified: Secondary | ICD-10-CM | POA: Diagnosis not present

## 2015-12-28 DIAGNOSIS — N920 Excessive and frequent menstruation with regular cycle: Secondary | ICD-10-CM | POA: Diagnosis not present

## 2015-12-28 DIAGNOSIS — Z90710 Acquired absence of both cervix and uterus: Secondary | ICD-10-CM | POA: Insufficient documentation

## 2015-12-28 DIAGNOSIS — N136 Pyonephrosis: Secondary | ICD-10-CM | POA: Diagnosis not present

## 2015-12-28 DIAGNOSIS — D649 Anemia, unspecified: Secondary | ICD-10-CM | POA: Insufficient documentation

## 2015-12-28 DIAGNOSIS — N390 Urinary tract infection, site not specified: Secondary | ICD-10-CM | POA: Diagnosis present

## 2015-12-28 DIAGNOSIS — N23 Unspecified renal colic: Secondary | ICD-10-CM | POA: Diagnosis present

## 2015-12-28 DIAGNOSIS — R1110 Vomiting, unspecified: Secondary | ICD-10-CM

## 2015-12-28 DIAGNOSIS — N941 Unspecified dyspareunia: Secondary | ICD-10-CM | POA: Diagnosis not present

## 2015-12-28 DIAGNOSIS — N201 Calculus of ureter: Secondary | ICD-10-CM

## 2015-12-28 DIAGNOSIS — Z885 Allergy status to narcotic agent status: Secondary | ICD-10-CM | POA: Insufficient documentation

## 2015-12-28 DIAGNOSIS — N132 Hydronephrosis with renal and ureteral calculous obstruction: Secondary | ICD-10-CM | POA: Diagnosis not present

## 2015-12-28 DIAGNOSIS — R102 Pelvic and perineal pain: Secondary | ICD-10-CM | POA: Insufficient documentation

## 2015-12-28 DIAGNOSIS — R110 Nausea: Secondary | ICD-10-CM | POA: Diagnosis not present

## 2015-12-28 HISTORY — PX: CYSTOSCOPY WITH RETROGRADE PYELOGRAM, URETEROSCOPY AND STENT PLACEMENT: SHX5789

## 2015-12-28 LAB — BASIC METABOLIC PANEL
Anion gap: 9 (ref 5–15)
BUN: 15 mg/dL (ref 6–20)
CALCIUM: 9.4 mg/dL (ref 8.9–10.3)
CHLORIDE: 102 mmol/L (ref 101–111)
CO2: 29 mmol/L (ref 22–32)
CREATININE: 0.8 mg/dL (ref 0.44–1.00)
GFR calc non Af Amer: >60 mL/min (ref >60)
Glucose, Bld: 102 mg/dL — ABNORMAL HIGH (ref 65–99)
Potassium: 3.9 mmol/L (ref 3.5–5.1)
Sodium: 140 mmol/L (ref 135–145)

## 2015-12-28 LAB — URINALYSIS COMPLETE WITH MICROSCOPIC (ARMC ONLY)
Bilirubin Urine: NEGATIVE
Glucose, UA: NEGATIVE mg/dL
Ketones, ur: NEGATIVE mg/dL
NITRITE: NEGATIVE
PH: 5 (ref 5.0–8.0)
Protein, ur: 30 mg/dL — AB
Specific Gravity, Urine: 1.019 (ref 1.005–1.030)

## 2015-12-28 LAB — CBC
HEMATOCRIT: 41.6 % (ref 35.0–47.0)
HEMOGLOBIN: 13.5 g/dL (ref 12.0–16.0)
MCH: 26.7 pg (ref 26.0–34.0)
MCHC: 32.5 g/dL (ref 32.0–36.0)
MCV: 82.3 fL (ref 80.0–100.0)
Platelets: 231 10*3/uL (ref 150–440)
RBC: 5.06 MIL/uL (ref 3.80–5.20)
RDW: 15.7 % — AB (ref 11.5–14.5)
WBC: 8 10*3/uL (ref 3.6–11.0)

## 2015-12-28 SURGERY — CYSTOURETEROSCOPY, WITH RETROGRADE PYELOGRAM AND STENT INSERTION
Anesthesia: General | Laterality: Left

## 2015-12-28 MED ORDER — OXYCODONE-ACETAMINOPHEN 5-325 MG PO TABS
2.0000 | ORAL_TABLET | Freq: Once | ORAL | Status: AC
  Administered 2015-12-28: 2 via ORAL
  Filled 2015-12-28: qty 2

## 2015-12-28 MED ORDER — KETOROLAC TROMETHAMINE 10 MG PO TABS: 10.0000 mg | ORAL_TABLET | Freq: Three times a day (TID) | ORAL | Status: DC | PRN

## 2015-12-28 MED ORDER — DEXTROSE 5 % IV SOLN
1.0000 g | Freq: Once | INTRAVENOUS | Status: AC
  Administered 2015-12-28: 1 g via INTRAVENOUS
  Filled 2015-12-28: qty 10

## 2015-12-28 MED ORDER — KETOROLAC TROMETHAMINE 30 MG/ML IJ SOLN
30.0000 mg | Freq: Once | INTRAMUSCULAR | Status: AC
  Administered 2015-12-28: 30 mg via INTRAVENOUS
  Filled 2015-12-28: qty 1

## 2015-12-28 MED ORDER — OXYBUTYNIN CHLORIDE 5 MG PO TABS: 5.0000 mg | ORAL_TABLET | Freq: Three times a day (TID) | ORAL | Status: DC | PRN

## 2015-12-28 MED ORDER — LIDOCAINE HCL (CARDIAC) 20 MG/ML IV SOLN
INTRAVENOUS | Status: DC | PRN
  Administered 2015-12-28: 20 mg via INTRAVENOUS

## 2015-12-28 MED ORDER — SODIUM CHLORIDE 0.9 % IV SOLN
Freq: Once | INTRAVENOUS | Status: AC
  Administered 2015-12-28: 1000 mL via INTRAVENOUS

## 2015-12-28 MED ORDER — CIPROFLOXACIN IN D5W 400 MG/200ML IV SOLN
400.0000 mg | Freq: Once | INTRAVENOUS | Status: DC
  Filled 2015-12-28: qty 200

## 2015-12-28 MED ORDER — FENTANYL CITRATE (PF) 100 MCG/2ML IJ SOLN
25.0000 ug | INTRAMUSCULAR | Status: DC | PRN
  Administered 2015-12-28 (×4): 25 ug via INTRAVENOUS

## 2015-12-28 MED ORDER — BELLADONNA ALKALOIDS-OPIUM 16.2-60 MG RE SUPP
RECTAL | Status: AC
  Filled 2015-12-28: qty 1

## 2015-12-28 MED ORDER — KETOROLAC TROMETHAMINE 30 MG/ML IJ SOLN
INTRAMUSCULAR | Status: DC | PRN
  Administered 2015-12-28: 30 mg via INTRAVENOUS

## 2015-12-28 MED ORDER — ONDANSETRON HCL 4 MG/2ML IJ SOLN
4.0000 mg | Freq: Once | INTRAMUSCULAR | Status: AC
Start: 2015-12-28 — End: 2015-12-28
  Administered 2015-12-28: 4 mg via INTRAVENOUS
  Filled 2015-12-28: qty 2

## 2015-12-28 MED ORDER — FENTANYL CITRATE (PF) 100 MCG/2ML IJ SOLN
INTRAMUSCULAR | Status: DC | PRN
  Administered 2015-12-28: 25 ug via INTRAVENOUS
  Administered 2015-12-28: 50 ug via INTRAVENOUS

## 2015-12-28 MED ORDER — SODIUM CHLORIDE 0.9 % IV BOLUS (SEPSIS)
1000.0000 mL | Freq: Once | INTRAVENOUS | Status: AC
  Administered 2015-12-28: 1000 mL via INTRAVENOUS

## 2015-12-28 MED ORDER — ONDANSETRON HCL 4 MG/2ML IJ SOLN
4.0000 mg | Freq: Once | INTRAMUSCULAR | Status: AC
  Administered 2015-12-28: 4 mg via INTRAVENOUS
  Filled 2015-12-28: qty 2

## 2015-12-28 MED ORDER — SULFAMETHOXAZOLE-TRIMETHOPRIM 800-160 MG PO TABS: 1.0000 | ORAL_TABLET | Freq: Two times a day (BID) | ORAL | Status: DC

## 2015-12-28 MED ORDER — ONDANSETRON HCL 4 MG/2ML IJ SOLN
INTRAMUSCULAR | Status: DC | PRN
  Administered 2015-12-28: 4 mg via INTRAVENOUS

## 2015-12-28 MED ORDER — TRAMADOL HCL 50 MG PO TABS: 100.0000 mg | ORAL_TABLET | Freq: Four times a day (QID) | ORAL | Status: DC | PRN

## 2015-12-28 MED ORDER — PROMETHAZINE HCL 12.5 MG PO TABS: 12.5000 mg | ORAL_TABLET | Freq: Four times a day (QID) | ORAL | Status: DC | PRN

## 2015-12-28 MED ORDER — PROPOFOL 10 MG/ML IV BOLUS
INTRAVENOUS | Status: DC | PRN
  Administered 2015-12-28: 160 mg via INTRAVENOUS

## 2015-12-28 MED ORDER — LIDOCAINE HCL 2 % EX GEL
CUTANEOUS | Status: AC
  Filled 2015-12-28: qty 10

## 2015-12-28 MED ORDER — DEXAMETHASONE SODIUM PHOSPHATE 10 MG/ML IJ SOLN
INTRAMUSCULAR | Status: DC | PRN
  Administered 2015-12-28: 4 mg via INTRAVENOUS

## 2015-12-28 MED ORDER — ONDANSETRON HCL 4 MG/2ML IJ SOLN: 4.0000 mg | Freq: Once | INTRAMUSCULAR | Status: DC | PRN

## 2015-12-28 MED ORDER — FENTANYL CITRATE (PF) 100 MCG/2ML IJ SOLN
INTRAMUSCULAR | Status: AC
  Administered 2015-12-28: 25 ug via INTRAVENOUS
  Filled 2015-12-28: qty 2

## 2015-12-28 MED ORDER — SODIUM CHLORIDE 0.9 % IV SOLN
INTRAVENOUS | Status: DC | PRN
  Administered 2015-12-28: 21:00:00 via INTRAVENOUS

## 2015-12-28 MED ORDER — BELLADONNA ALKALOIDS-OPIUM 16.2-60 MG RE SUPP
RECTAL | Status: DC | PRN
  Administered 2015-12-28: 1 via RECTAL

## 2015-12-28 MED ORDER — LIDOCAINE HCL 2 % EX GEL
CUTANEOUS | Status: DC | PRN
  Administered 2015-12-28: 1

## 2015-12-28 MED ORDER — CIPROFLOXACIN IN D5W 400 MG/200ML IV SOLN
INTRAVENOUS | Status: DC | PRN
  Administered 2015-12-28: 400 mg via INTRAVENOUS

## 2015-12-28 MED ORDER — HYDROMORPHONE HCL 1 MG/ML IJ SOLN
0.5000 mg | Freq: Once | INTRAMUSCULAR | Status: AC
  Administered 2015-12-28: 0.5 mg via INTRAVENOUS
  Filled 2015-12-28: qty 1

## 2015-12-28 MED ORDER — SODIUM CHLORIDE 0.9 % IV SOLN
INTRAVENOUS | Status: DC | PRN
  Administered 2015-12-28: 24 mL

## 2015-12-28 SURGICAL SUPPLY — 34 items
ADAPTER SCOPE UROLOK II (MISCELLANEOUS) IMPLANT
BAG DRAIN CYSTO-URO LG1000N (MISCELLANEOUS) ×3 IMPLANT
BASKET 3WIRE GEMINI 2.4X120 (MISCELLANEOUS) IMPLANT
BASKET LASER NITINOL 1.9FR (BASKET) IMPLANT
BASKET ZERO TIP 1.9FR (BASKET) ×3 IMPLANT
CATH URETL 5X70 OPEN END (CATHETERS) ×3 IMPLANT
CATH URETL OPEN END 6X70 (CATHETERS) ×3 IMPLANT
CNTNR SPEC 2.5X3XGRAD LEK (MISCELLANEOUS) ×1
CONT SPEC 4OZ STER OR WHT (MISCELLANEOUS) ×2
CONTAINER SPEC 2.5X3XGRAD LEK (MISCELLANEOUS) ×1 IMPLANT
GLOVE BIO SURGEON STRL SZ7.5 (GLOVE) ×3 IMPLANT
GLOVE BIO SURGEON STRL SZ8 (GLOVE) ×3 IMPLANT
GOWN STRL REUS W/ TWL LRG LVL4 (GOWN DISPOSABLE) ×2 IMPLANT
GOWN STRL REUS W/ TWL XL LVL3 (GOWN DISPOSABLE) ×1 IMPLANT
GOWN STRL REUS W/TWL LRG LVL4 (GOWN DISPOSABLE) ×4
GOWN STRL REUS W/TWL XL LVL3 (GOWN DISPOSABLE) ×2
GUIDEWIRE STR ZIPWIRE 035X150 (MISCELLANEOUS) ×3 IMPLANT
INTRODUCER DILATOR DOUBLE (INTRODUCER) IMPLANT
KIT INTRODUCER PERC 15FR PTFE (INTRODUCER) ×3 IMPLANT
PACK CYSTO AR (MISCELLANEOUS) ×3 IMPLANT
PREP PVP WINGED SPONGE (MISCELLANEOUS) ×3 IMPLANT
SENSORWIRE 0.038 NOT ANGLED (WIRE)
SET CYSTO W/LG BORE CLAMP LF (SET/KITS/TRAYS/PACK) ×3 IMPLANT
SHEATH URETERAL 12FRX35CM (MISCELLANEOUS) IMPLANT
SOL .9 NS 3000ML IRR  AL (IV SOLUTION) ×2
SOL .9 NS 3000ML IRR UROMATIC (IV SOLUTION) ×1 IMPLANT
SOL PREP PVP 2OZ (MISCELLANEOUS) ×3
SOLUTION PREP PVP 2OZ (MISCELLANEOUS) ×1 IMPLANT
STENT URET 6FRX24 CONTOUR (STENTS) ×3 IMPLANT
STENT URET 6FRX26 CONTOUR (STENTS) IMPLANT
SURGILUBE 2OZ TUBE FLIPTOP (MISCELLANEOUS) ×3 IMPLANT
SYRINGE IRR TOOMEY STRL 70CC (SYRINGE) IMPLANT
WATER STERILE IRR 1000ML POUR (IV SOLUTION) ×3 IMPLANT
WIRE SENSOR 0.038 NOT ANGLED (WIRE) IMPLANT

## 2015-12-28 NOTE — ED Notes (Signed)
Pt to ed with c/o left flank pain since Sunday.  Pt states she was seen at [facility] on Sunday for the same, dx with kidney stones.  Pt states the pain continues and she wants a second opinion.

## 2015-12-28 NOTE — H&P (Signed)
H&P  Chief Complaint: Left ureteral stone  History of Present Illness: 45 year old female with history of left ureteral stone. She went to [facility] couple of days ago and had left flank pain. CT by report is dictated left UVJ stone but in the conclusions left UPJ stone. I believe the stone was likely the left UPJ because it was in the kidney in August and on KUB today there is a calcification in the mid ureter likely stone progression. Also there is left hydronephrosis on ultrasound. Prior CT showed minimal obstruction. I reviewed the ultrasound and KUB images. The patient continues to have pain and feels nausea. She vomited earlier. She has been kept nothing by mouth and on IV fluids. She has had no dysuria or fever.  Past Medical History  Diagnosis Date  . Complication of anesthesia     n/v  . Anemia    Past Surgical History  Procedure Laterality Date  . Cesarean section    . Hand tenodesis    . Laparoscopic assisted vaginal hysterectomy N/A 04/15/2015    Procedure: LAPAROSCOPIC ASSISTED VAGINAL HYSTERECTOMY;  Surgeon: Boykin Nearing, MD;  Location: [facility] ORS;  Service: Gynecology;  Laterality: N/A;  . Laparoscopic salpingo oopherectomy Bilateral 04/15/2015    Procedure: LAPAROSCOPIC SALPINGO OOPHORECTOMY;  Surgeon: Boykin Nearing, MD;  Location: [facility] ORS;  Service: Gynecology;  Laterality: Bilateral;  . Cystoscopy N/A 04/15/2015    Procedure: CYSTOSCOPY;  Surgeon: Boykin Nearing, MD;  Location: [facility] ORS;  Service: Gynecology;  Laterality: N/A;    Home Medications:   (Not in a hospital admission) Allergies:  Allergies  Allergen Reactions  . Ciprodex [Ciprofloxacin-Dexamethasone] Itching  . Oxycodone Nausea And Vomiting    History reviewed. No pertinent family history. Social History:  reports that she has never smoked. She has never used smokeless tobacco. She reports that she does not drink alcohol. Her drug history is not on file.  ROS: A complete review of  systems was performed.  All systems are negative except for pertinent findings as noted. ROS   Physical Exam:  Vital signs in last 24 hours: Temp:  [97.7 F (36.5 C)] 97.7 F (36.5 C) (01/25 0815) Pulse Rate:  [57-71] 57 (01/25 1930) Resp:  [11-20] 13 (01/25 1930) BP: (115-178)/(67-119) 146/73 mmHg (01/25 1930) SpO2:  [95 %-99 %] 96 % (01/25 1930) Weight:  [52.164 kg (115 lb)] 52.164 kg (115 lb) (01/25 0815) General:  Alert and oriented, No acute distress Neck: No JVD or lymphadenopathy Cardiovascular: Regular rate and rhythm Lungs: Regular rate and effort Abdomen: Soft, nontender, nondistended, no abdominal masses Extremities: No edema Neurologic: Grossly intact  Laboratory Data:  Results for orders placed or performed during the hospital encounter of 12/28/15 (from the past 24 hour(s))  CBC     Status: Abnormal   Collection Time: 12/28/15  8:30 AM  Result Value Ref Range   WBC 8.0 3.6 - 11.0 K/uL   RBC 5.06 3.80 - 5.20 MIL/uL   Hemoglobin 13.5 12.0 - 16.0 g/dL   HCT 41.6 35.0 - 47.0 %   MCV 82.3 80.0 - 100.0 fL   MCH 26.7 26.0 - 34.0 pg   MCHC 32.5 32.0 - 36.0 g/dL   RDW 15.7 (H) 11.5 - 14.5 %   Platelets 231 150 - 440 K/uL  Basic metabolic panel     Status: Abnormal   Collection Time: 12/28/15  8:30 AM  Result Value Ref Range   Sodium 140 135 - 145 mmol/L   Potassium 3.9 3.5 -  5.1 mmol/L   Chloride 102 101 - 111 mmol/L   CO2 29 22 - 32 mmol/L   Glucose, Bld 102 (H) 65 - 99 mg/dL   BUN 15 6 - 20 mg/dL   Creatinine, Ser 0.80 0.44 - 1.00 mg/dL   Calcium 9.4 8.9 - 10.3 mg/dL   GFR calc non Af Amer >60 >60 mL/min   GFR calc Af Amer >60 >60 mL/min   Anion gap 9 5 - 15  Urinalysis complete, with microscopic ([facility] only)     Status: Abnormal   Collection Time: 12/28/15  8:30 AM  Result Value Ref Range   Color, Urine YELLOW (A) YELLOW   APPearance CLOUDY (A) CLEAR   Glucose, UA NEGATIVE NEGATIVE mg/dL   Bilirubin Urine NEGATIVE NEGATIVE   Ketones, ur NEGATIVE  NEGATIVE mg/dL   Specific Gravity, Urine 1.019 1.005 - 1.030   Hgb urine dipstick 3+ (A) NEGATIVE   pH 5.0 5.0 - 8.0   Protein, ur 30 (A) NEGATIVE mg/dL   Nitrite NEGATIVE NEGATIVE   Leukocytes, UA 3+ (A) NEGATIVE   RBC / HPF TOO NUMEROUS TO COUNT 0 - 5 RBC/hpf   WBC, UA TOO NUMEROUS TO COUNT 0 - 5 WBC/hpf   Bacteria, UA RARE (A) NONE SEEN   Squamous Epithelial / LPF 0-5 (A) NONE SEEN   WBC Clumps PRESENT    RBC Clump PRESENT    Mucous PRESENT    No results found for this or any previous visit (from the past 240 hour(s)). Creatinine:  Recent Labs  12/28/15 0830  CREATININE 0.80     Impression/Assessment/plan:    at a long discussion with the patient and her husband about the CT, KUB and ultrasound findings a told him I believe the stone was likely the UPJ and down the proximal to mid ureter. I discussed with the patient the nature, potential benefits, risks and alternatives to cystoscopy with left retrograde pyelogram left ureteroscopy possible holmium laser lithotripsy and stent placement. We discussed she may need a staged procedure/prestenting as the stone is above the iliac vessels. We discussed side effects of the proposed treatment, the likelihood of the patient achieving the goals of the procedure, and any potential problems that might occur during the procedure or recuperation. All questions answered. Patient elects to proceed. If she remains stable will plan d/c from PACU.   Dagon Budai 12/28/2015, 8:35 PM

## 2015-12-28 NOTE — Transfer of Care (Signed)
Immediate Anesthesia Transfer of Care Note  Patient: Gail [name]  Procedure(s) Performed: Procedure(s): CYSTOSCOPY WITH RETROGRADE PYELOGRAM, URETEROSCOPY AND STENT PLACEMENT (Left)  Patient Location: PACU  Anesthesia Type:General  Level of Consciousness: awake, alert , oriented and patient cooperative  Airway & Oxygen Therapy: Patient Spontanous Breathing  Post-op Assessment: Report given to RN and Post -op Vital signs reviewed and stable  Post vital signs: Reviewed and stable  Last Vitals:  Filed Vitals:   12/28/15 1900 12/28/15 1930  BP: 153/71 146/73  Pulse: 58 57  Temp:    Resp: 14 13    Complications: No apparent anesthesia complications

## 2015-12-28 NOTE — Progress Notes (Signed)
Patient with a left proximal ureteral stone. It is a small stone. I called [facility] and confirm the stone was at the left ureteropelvic junction/proximal. I could not get the semirigid or the flexible digital proximally into the proximal ureter or collecting system. The ureteral stent was placed.  Would recommend second look ureteroscopy as the ureter was quite tight and the stone may have moved proximally into the collecting system. It's possible she passed it but a KUB preop showed a left proximal to mid ureteral stone and hydro. Also pt did not see stone pass. I'll have her f/u in office in 2 weeks with a KUB for f/u visit to discuss second look URS vs stent pull.

## 2015-12-28 NOTE — Anesthesia Preprocedure Evaluation (Signed)
Anesthesia Evaluation  Patient identified by MRN, date of birth, ID band Patient awake    Reviewed: Allergy & Precautions, NPO status , Patient's Chart, lab work & pertinent test results  History of Anesthesia Complications (+) PONV  Airway Mallampati: III       Dental   Pulmonary neg pulmonary ROS,           Cardiovascular negative cardio ROS       Neuro/Psych negative neurological ROS     GI/Hepatic negative GI ROS, Neg liver ROS,   Endo/Other  negative endocrine ROS  Renal/GU Renal disease (stones)     Musculoskeletal   Abdominal   Peds  Hematology  (+) anemia ,   Anesthesia Other Findings   Reproductive/Obstetrics                             Anesthesia Physical Anesthesia Plan  ASA: II and emergent  Anesthesia Plan: General   Post-op Pain Management:    Induction:   Airway Management Planned: LMA  Additional Equipment:   Intra-op Plan:   Post-operative Plan:   Informed Consent: I have reviewed the patients History and Physical, chart, labs and discussed the procedure including the risks, benefits and alternatives for the proposed anesthesia with the patient or authorized representative who has indicated his/her understanding and acceptance.     Plan Discussed with:   Anesthesia Plan Comments:         Anesthesia Quick Evaluation

## 2015-12-28 NOTE — Op Note (Signed)
Preoperative diagnosis: Left proximal ureteral  stone, left hydronephrosis   postoperative diagnosis: Same   Procedure: Cystoscopy, left retrograde pyelogram, left ureteroscopy, left ureteral stent placement   Surgeon: Junious Silk   Anesthesia: Gen.   Indication for procedure: 45 year old with symptomatic left proximal ureteral stone. This was her second visit to the emergency department in 3 days. She had continued left flank pain nausea and vomiting and had not seen a stone pass. We discussed the nature risk and benefits of continued stone passage versus cystoscopy, ureteroscopy with possible holmium laser lithotripsy and stent placement. We discussed risk of bleeding infection ureteral injury and need for staged procedure among others. All questions answered elected to proceed to the OR.   I did call [facility] and spoke to one of the radiologist on call and indeed the stone was proximal at the UPJ. This correlated with the KUB here which showed the stone in the proximal ureter headed toward the mid ureter.   Findings: On cystoscopy the bladder was unremarkable. There was no stone or foreign body in the bladder. The mucosa was normal.   Left retrograde pyelogram-this outlined a single ureter single collecting system unit with a small filling defect in the region of the opacity on the KUB. Proximal to this there was mild dilation of the renal pelvis and collecting system but fairly brisk washout of the ureter.   On ureteroscopy the semirigid and flexible ureteroscope were able to get to the proximal ureter near the region of the stone but no further. The ureter was tight and the mucosa and ureter began to bunch up around the scope and not allow further passage. Peering through the narrowed ureter there seemed to be an area of some erythema where possible stone was impacted prior. This possible it was pushed into the collecting system with wire placement. Therefore, I thought it best to leave a stent and  bring her back for a second look ureteroscopy in a few weeks.   The ureteroscope and the access sheath were backed out together and there was about a 1 cm area of mucosal shear in the mid to distal ureter in the pelvis.   Description of procedure: After consent was obtained patient brought to the operating room. After adequate anesthesia she was placed in lithotomy and prepped and draped in the usual sterile fashion. A cystoscope was passed per urethra and the bladder triple rinsed. A 5 French open-ended catheter was passed through the left ureteral orifice and retrograde injection of contrast was performed. A sensor wire was then advanced and coiled in the collecting system. A 4.5 French needle point semirigid ureteroscope was advanced without difficulty up into the proximal ureter but the ureter narrowed and began to bunch up and would not allow further passage. I thought the single-channel digital ureteroscope might pass and therefore under direct vision I passed a Glidewire and backed out the semirigid scope. Over the sensor wire ureteral access sheath was passed into the proximal ureter without difficulty. I then passed the digital ureteroscope but again the ureter narrowed and would not allow scope passage. Therefore I decided to leave a ureteral stent and she would need a staged procedure. The ureteroscope and the access sheath were backed out together and no stone was noted or significant ureteral injury but I did note a lateral area of mucosa that appeared to split or shear for about 1 cm at the mid to distal ureter as the ureter curved up in the pelvis to head up toward  the iliacs. The wire was then backloaded on the cystoscope and a 6 x 24 cm stent was advanced without difficulty. The wire was removed with a good coil seen in the left renal pelvis and a good coil in the bladder. It was good drainage through the stent. The bladder was drained and the scope removed. Some lidocaine jelly was passed per  urethra and a B&O suppository was placed per rectum. She was awakened and taken to the recovery room in stable condition.   Complications: None   Blood loss: Minimal   specimens: None    drains: 6 x 24 cm left ureteral stent

## 2015-12-28 NOTE — ED Notes (Signed)
OR called and stated they will come get pt in 5 minutes.

## 2015-12-28 NOTE — Discharge Instructions (Signed)
Please drink plenty of fluid to stay well hydrated. Please continue the Flomax medicine. You may continue Zofran for nausea and vomiting. Please take Tylenol or Toradol for mild to moderate pain. Do not take any other NSAID medications, including Aleve, ibuprofen, Advil, or Motrin when you're taking Toradol. You may take Percocet for severe pain. Do not drive within 8 hours of taking Percocet. Please take the entire course of antibiotics, even if you're feeling better.  Return to the emergency department if he develops severe pain, inability to keep down fluids, fever, or any other symptoms that are not controlled with the medications you have at home.  Ureteral Stent Implantation, Care After Refer to this sheet in the next few weeks. These instructions provide you with information on caring for yourself after your procedure. Your health care provider may also give you more specific instructions. Your treatment has been planned according to current medical practices, but problems sometimes occur. Call your health care provider if you have any problems or questions after your procedure. WHAT TO EXPECT AFTER THE PROCEDURE You should be back to normal activity within 48 hours after the procedure. Nausea and vomiting may occur and are commonly the result of anesthesia. It is common to experience sharp pain in the back or lower abdomen and penis with voiding. This is caused by movement of the ends of the stent with the act of urinating.It usually goes away within minutes after you have stopped urinating. HOME CARE INSTRUCTIONS Make sure to drink plenty of fluids. You may have small amounts of bleeding, causing your urine to be red. This is normal. Certain movements may trigger pain or a feeling that you need to urinate. You may be given medicines to prevent infection or bladder spasms. Be sure to take all medicines as directed. Only take over-the-counter or prescription medicines for pain, discomfort, or fever  as directed by your health care provider. Do not take aspirin, as this can make bleeding worse.  REMOVAL OF THE STENT: Your stent will be left in until the blockage/stone is resolved. This may take 2 weeks or longer, depending on the reason for stent implantation. The stent can be removed by your health care provider in the office. Be sure to keep all follow-up appointments so your health care provider can check that you are healing properly.  SEEK MEDICAL CARE IF:  You experience increasing pain.  Your pain medicine is not working. SEEK IMMEDIATE MEDICAL CARE IF:  Your urine is dark red or has blood clots.  You are leaking urine (incontinent).  You have a fever, chills, feeling sick to your stomach (nausea), or vomiting.  Your pain is not relieved by pain medicine.  The end of the stent comes out of the urethra.  You are unable to urinate.   This information is not intended to replace advice given to you by your health care provider. Make sure you discuss any questions you have with your health care provider.

## 2015-12-28 NOTE — Anesthesia Procedure Notes (Signed)
Procedure Name: LMA Insertion Date/Time: 12/28/2015 8:57 PM Performed by: Lendon Colonel Pre-anesthesia Checklist: Patient identified, Emergency Drugs available, Suction available, Timeout performed and Patient being monitored Patient Re-evaluated:Patient Re-evaluated prior to inductionOxygen Delivery Method: Circle system utilized Preoxygenation: Pre-oxygenation with 100% oxygen Intubation Type: IV induction Ventilation: Mask ventilation without difficulty LMA: LMA inserted LMA Size: 3.5

## 2015-12-28 NOTE — ED Provider Notes (Addendum)
[facility] Emergency Department Provider Note  ____________________________________________  Time seen: Approximately 8:43 AM  I have reviewed the triage vital signs and the nursing notes.   HISTORY  Chief Complaint Flank Pain    HPI Gail [name] is a 45 y.o. female with a known left renal stone presenting with ongoing left flank and left lower quadrant pain, nausea and vomiting, and subjective fever. Patient was seen at [facility] on 1/23 and discharged with Flomax, Percocet and Zofran for 3 mm stone that was at the left UVJ. She has not called to schedule an outpatient follow-up appointment with urology. Since being discharged, the patient reports ongoing left flank and left lower quadrant pain, with associated nausea and vomiting, however she is able to keep down liquids. Last night she felt hot but did not take her temperature. She denies any hematuria, chills, lightheadedness or syncope.   Past Medical History  Diagnosis Date  . Complication of anesthesia     n/v  . Anemia     Patient Active Problem List   Diagnosis Date Noted  . Post-operative state 04/15/2015  . Menorrhagia 04/06/2015  . Chronic pelvic pain in female 04/06/2015  . 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 Dyspareunia 04/06/2015    Past Surgical History  Procedure Laterality Date  . Cesarean section    . Hand tenodesis    . Laparoscopic assisted vaginal hysterectomy N/A 04/15/2015    Procedure: LAPAROSCOPIC ASSISTED VAGINAL HYSTERECTOMY;  Surgeon: Boykin Nearing, MD;  Location: [facility] ORS;  Service: Gynecology;  Laterality: N/A;  . Laparoscopic salpingo oopherectomy Bilateral 04/15/2015    Procedure: LAPAROSCOPIC SALPINGO OOPHORECTOMY;  Surgeon: Boykin Nearing, MD;  Location: [facility] ORS;  Service: Gynecology;  Laterality: Bilateral;  . Cystoscopy N/A 04/15/2015    Procedure: CYSTOSCOPY;  Surgeon: Boykin Nearing, MD;  Location: [facility] ORS;  Service: Gynecology;   Laterality: N/A;    Current Outpatient Rx  Name  Route  Sig  Dispense  Refill  . ondansetron (ZOFRAN) 4 MG tablet   Oral   Take 4 mg by mouth every 8 (eight) hours as needed for nausea or vomiting.         Marland Kitchen oxyCODONE-acetaminophen (PERCOCET/ROXICET) 5-325 MG tablet   Oral   Take 2 tablets by mouth every 6 (six) hours as needed for severe pain.         . tamsulosin (FLOMAX) 0.4 MG CAPS capsule   Oral   Take 0.4 mg by mouth daily.         Marland Kitchen acetaminophen (TYLENOL) 500 MG tablet   Oral   Take 2 tablets (1,000 mg total) by mouth every 6 (six) hours as needed for fever or headache. Patient not taking: Reported on 12/28/2015   30 tablet   0   . docusate sodium (COLACE) 100 MG capsule   Oral   Take 1 capsule (100 mg total) by mouth 2 (two) times daily. Patient not taking: Reported on 12/28/2015   60 capsule   0   . ketorolac (TORADOL) 10 MG tablet   Oral   Take 1 tablet (10 mg total) by mouth every 8 (eight) hours as needed for moderate pain (with food).   15 tablet   0   . meperidine (DEMEROL) 50 MG tablet   Oral   Take 2 tablets (100 mg total) by mouth every 4 (four) hours as needed for severe pain. Patient not taking: Reported on 12/28/2015   30 tablet   0   .  naproxen (NAPROSYN) 500 MG tablet   Oral   Take 1 tablet (500 mg total) by mouth 2 (two) times daily with a meal. Patient not taking: Reported on 12/28/2015   60 tablet   1   . promethazine (PHENERGAN) 25 MG tablet   Oral   Take 1 tablet (25 mg total) by mouth every 4 (four) hours as needed for nausea or vomiting. Patient not taking: Reported on 12/28/2015   30 tablet   0   . sulfamethoxazole-trimethoprim (BACTRIM DS,SEPTRA DS) 800-160 MG tablet   Oral   Take 1 tablet by mouth 2 (two) times daily.   10 tablet   0     Allergies Ciprodex and Oxycodone  History reviewed. No pertinent family history.  Social History Social History  Substance Use Topics  . Smoking status: Never Smoker   .  Smokeless tobacco: Never Used  . Alcohol Use: No    Review of Systems Constitutional: Positive subjective fever. No chills. No lightheadedness or syncope. Eyes: No visual changes. ENT: No sore throat. Cardiovascular: Denies chest pain, palpitations. Respiratory: Denies shortness of breath.  No cough. Gastrointestinal: Positive left flank and left lower quadrant abdominal pain.  Positive nausea, positive vomiting.  No diarrhea.  No constipation. Genitourinary: Negative for dysuria. Negative for hematuria. Musculoskeletal: Negative for back pain. Skin: Negative for rash. Neurological: Negative for headaches, focal weakness or numbness.  10-point ROS otherwise negative.  ____________________________________________   PHYSICAL EXAM:  VITAL SIGNS: ED Triage Vitals  Enc Vitals Group     BP 12/28/15 0815 143/90 mmHg     Pulse Rate 12/28/15 0815 71     Resp 12/28/15 0815 20     Temp 12/28/15 0815 97.7 F (36.5 C)     Temp Source 12/28/15 0815 Oral     SpO2 12/28/15 0815 97 %     Weight 12/28/15 0815 115 lb (52.164 kg)     Height 12/28/15 0815 5\' 2"  (1.575 m)     Head Cir --      Peak Flow --      Pain Score 12/28/15 0811 10     Pain Loc --      Pain Edu? --      Excl. in [place]? --     Constitutional: Patient is alert and oriented and answering questions properly. She is uncomfortable appearing.  Eyes: Conjunctivae are normal.  EOMI. no scleral icterus. Head: Atraumatic. Nose: No congestion/rhinnorhea. Mouth/Throat: Mucous membranes are moist.  Neck: No stridor.  Supple.   Cardiovascular: Normal rate, regular rhythm. No murmurs, rubs or gallops.  Respiratory: Normal respiratory effort.  No retractions. Lungs CTAB.  No wheezes, rales or ronchi. Gastrointestinal: Positive left CVA tenderness. Abdomen is soft, nondistended, and mildly tenderness to palpation in the left lower quadrant. No rebound or guarding. No peritoneal signs. Musculoskeletal: No LE edema.  Neurologic:   Normal speech and language. No gross focal neurologic deficits are appreciated.  Skin:  Skin is warm, dry and intact. No rash noted. Psychiatric: Mood and affect are normal. Speech and behavior are normal.  Normal judgement.  ____________________________________________   LABS (all labs ordered are listed, but only abnormal results are displayed)  Labs Reviewed  CBC - Abnormal; Notable for the following:    RDW 15.7 (*)    All other components within normal limits  BASIC METABOLIC PANEL - Abnormal; Notable for the following:    Glucose, Bld 102 (*)    All other components within normal limits  URINALYSIS COMPLETEWITH MICROSCOPIC (  [facility] ONLY) - Abnormal; Notable for the following:    Color, Urine YELLOW (*)    APPearance CLOUDY (*)    Hgb urine dipstick 3+ (*)    Protein, ur 30 (*)    Leukocytes, UA 3+ (*)    Bacteria, UA RARE (*)    Squamous Epithelial / LPF 0-5 (*)    All other components within normal limits  URINE CULTURE   ____________________________________________  EKG  Not indicated ____________________________________________  RADIOLOGY  No results found.  ____________________________________________   PROCEDURES  Procedure(s) performed: None  Critical Care performed: No ____________________________________________   INITIAL IMPRESSION / ASSESSMENT AND PLAN / ED COURSE  Pertinent labs & imaging results that were available during my care of the patient were reviewed by me and considered in my medical decision making (see chart for details).  45 y.o. email with known left renal stone presenting with ongoing pain, nausea and vomiting. Here, the patient is nontoxic appearing, and afebrile. She does have some pain which will I will treat symptomatically. We will also check her kidney function and look for evidence of urinary infection. Given that the patient is well-appearing and has a known 3 mm stone which is likely to pass without intervention, there is no  indication for repeat imaging at this time.  ----------------------------------------- 12:48 PM on 12/28/2015 -----------------------------------------  The patient received IV medications, after which she felt much better. She was transitioned to oral pain medication and is currently completely pain-free. Her nausea has resolved and she is tolerating liquid by mouth. Her creatinine is normal and she does not have an elevated white blood cell count. She does have some rare bacteria with white blood cells and leukocyte esterase but no nitrites in her urine. Plan to discharge her home with antibiotics for UTI, and have sent the urine for culture. She will be discharged home with urology follow-up, and she understands return precautions as well as follow-up instructions.  ----------------------------------------- 1:24 PM on 12/28/2015 -----------------------------------------  The patient was in the process of being discharged when she had acute onset of vomiting again. At this time, the patient is failing outpatient management of her renal stone. I have spoken with the urologist on-call, who will plan to take her to the operating room this afternoon. She will be nothing by mouth and continue to receive IV fluids, and symptomatic treatment. She will be admitted to the hospitalist service. She her husband are in understanding of the plan and in agreement. ____________________________________________  FINAL CLINICAL IMPRESSION(S) / ED DIAGNOSES  Final diagnoses:  Renal colic on left side  UTI (lower urinary tract infection)      NEW MEDICATIONS STARTED DURING THIS VISIT:  New Prescriptions   KETOROLAC (TORADOL) 10 MG TABLET    Take 1 tablet (10 mg total) by mouth every 8 (eight) hours as needed for moderate pain (with food).   SULFAMETHOXAZOLE-TRIMETHOPRIM (BACTRIM DS,SEPTRA DS) 800-160 MG TABLET    Take 1 tablet by mouth 2 (two) times daily.     Eula Listen, MD 12/28/15  1249  Eula Listen, MD 12/28/15 1325

## 2015-12-28 NOTE — ED Notes (Signed)
Patient transported to Ultrasound 

## 2015-12-28 NOTE — ED Notes (Signed)
Patient given water for PO fluid challenge, patient unable to tolerate PO fluids at this time. Dr. Mariea Clonts informed.

## 2015-12-28 NOTE — Anesthesia Postprocedure Evaluation (Signed)
Anesthesia Post Note  Patient: Gail [name]  Procedure(s) Performed: Procedure(s) (LRB): CYSTOSCOPY WITH RETROGRADE PYELOGRAM, URETEROSCOPY AND STENT PLACEMENT (Left)  Patient location during evaluation: PACU Anesthesia Type: General Level of consciousness: awake and alert Pain management: pain level controlled Vital Signs Assessment: post-procedure vital signs reviewed and stable Respiratory status: spontaneous breathing and respiratory function stable Cardiovascular status: stable Anesthetic complications: no    Last Vitals:  Filed Vitals:   12/28/15 1930 12/28/15 2157  BP: 146/73 142/81  Pulse: 57 85  Temp:  36.7 C  Resp: 13 14    Last Pain:  Filed Vitals:   12/28/15 2201  PainSc: Asleep                 KEPHART,WILLIAM K

## 2015-12-28 NOTE — ED Notes (Signed)
Patient seen at [facility] on Sunday and diagnosed with Kidney stone, this morning the pain is worse. Patient is having pain in her abdomen on the lower left side and in left flank. Patient also c/o burning with urination.

## 2015-12-29 ENCOUNTER — Telehealth: Payer: Self-pay

## 2015-12-29 ENCOUNTER — Encounter: Payer: Self-pay | Admitting: Urology

## 2015-12-29 NOTE — Telephone Encounter (Signed)
-----   Message from Festus Aloe, MD sent at 12/28/2015 10:24 PM EST ----- Pt s/p left ureteral stent for a small left ureteral stone. Pt needs f/u in 2 weeks with a KUB to discuss stent pull or second look ureteroscopy. Thanks.

## 2015-12-30 NOTE — Telephone Encounter (Signed)
Patient is scheduled for a follow up on 01/16/16.  Patient's husband called today and inquired about medication prescribed to his wife at discharge.  The patient is experiencing upset stomach and constipation.  I consulted with Dr. Pilar Jarvis and he advised the patient to discontinue medication until her follow up appointment on 01/16/2016.  I discussed Dr. Carlynn Purl directions with the patient's husband.  He expressed his understanding and agreed to call back if her symptoms do not improve.

## 2015-12-31 LAB — URINE CULTURE

## 2016-01-02 ENCOUNTER — Encounter: Payer: Self-pay | Admitting: Urology

## 2016-01-09 ENCOUNTER — Telehealth: Payer: Self-pay

## 2016-01-09 NOTE — Telephone Encounter (Signed)
Pt called stating she is having back pain and urinary frequency. Made aware that is normal for a stent. Pt voiced understanding.

## 2016-01-11 ENCOUNTER — Telehealth: Payer: Self-pay | Admitting: Radiology

## 2016-01-11 ENCOUNTER — Emergency Department: Payer: Federal, State, Local not specified - PPO

## 2016-01-11 ENCOUNTER — Encounter: Payer: Self-pay | Admitting: Emergency Medicine

## 2016-01-11 ENCOUNTER — Emergency Department
Admission: EM | Admit: 2016-01-11 | Discharge: 2016-01-11 | Disposition: A | Discharge status: Home / Self Care | Payer: Federal, State, Local not specified - PPO | Attending: Emergency Medicine | Admitting: Emergency Medicine

## 2016-01-11 DIAGNOSIS — R1110 Vomiting, unspecified: Secondary | ICD-10-CM | POA: Insufficient documentation

## 2016-01-11 DIAGNOSIS — Z79899 Other long term (current) drug therapy: Secondary | ICD-10-CM | POA: Insufficient documentation

## 2016-01-11 DIAGNOSIS — T839XXA Unspecified complication of genitourinary prosthetic device, implant and graft, initial encounter: Secondary | ICD-10-CM | POA: Diagnosis not present

## 2016-01-11 DIAGNOSIS — R109 Unspecified abdominal pain: Secondary | ICD-10-CM

## 2016-01-11 DIAGNOSIS — R300 Dysuria: Secondary | ICD-10-CM | POA: Diagnosis not present

## 2016-01-11 DIAGNOSIS — Y658 Other specified misadventures during surgical and medical care: Secondary | ICD-10-CM | POA: Diagnosis not present

## 2016-01-11 LAB — CBC WITH DIFFERENTIAL/PLATELET
BASOS ABS: 0.1 10*3/uL (ref 0–0.1)
Basophils Relative: 1 %
EOS ABS: 0.3 10*3/uL (ref 0–0.7)
EOS PCT: 6 %
HCT: 38.2 % (ref 35.0–47.0)
Hemoglobin: 12.6 g/dL (ref 12.0–16.0)
LYMPHS ABS: 1.3 10*3/uL (ref 1.0–3.6)
LYMPHS PCT: 20 %
MCH: 27.4 pg (ref 26.0–34.0)
MCHC: 33.1 g/dL (ref 32.0–36.0)
MCV: 82.8 fL (ref 80.0–100.0)
MONO ABS: 0.4 10*3/uL (ref 0.2–0.9)
Monocytes Relative: 6 %
Neutro Abs: 4.2 10*3/uL (ref 1.4–6.5)
Neutrophils Relative %: 67 %
PLATELETS: 281 10*3/uL (ref 150–440)
RBC: 4.61 MIL/uL (ref 3.80–5.20)
RDW: 14.7 % — AB (ref 11.5–14.5)
WBC: 6.3 10*3/uL (ref 3.6–11.0)

## 2016-01-11 LAB — COMPREHENSIVE METABOLIC PANEL
ALK PHOS: 100 U/L (ref 38–126)
ALT: 20 U/L (ref 14–54)
AST: 22 U/L (ref 15–41)
Albumin: 4.2 g/dL (ref 3.5–5.0)
Anion gap: 4 — ABNORMAL LOW (ref 5–15)
BUN: 15 mg/dL (ref 6–20)
CALCIUM: 9.3 mg/dL (ref 8.9–10.3)
CHLORIDE: 105 mmol/L (ref 101–111)
CO2: 30 mmol/L (ref 22–32)
CREATININE: 0.66 mg/dL (ref 0.44–1.00)
Glucose, Bld: 96 mg/dL (ref 65–99)
Potassium: 3.7 mmol/L (ref 3.5–5.1)
Sodium: 139 mmol/L (ref 135–145)
Total Bilirubin: 0.4 mg/dL (ref 0.3–1.2)
Total Protein: 7.8 g/dL (ref 6.5–8.1)

## 2016-01-11 LAB — URINALYSIS COMPLETE WITH MICROSCOPIC (ARMC ONLY)
BACTERIA UA: NONE SEEN
Bilirubin Urine: NEGATIVE
Glucose, UA: NEGATIVE mg/dL
Ketones, ur: NEGATIVE mg/dL
Nitrite: NEGATIVE
PROTEIN: 100 mg/dL — AB
Specific Gravity, Urine: 1.018 (ref 1.005–1.030)
pH: 6 (ref 5.0–8.0)

## 2016-01-11 LAB — LIPASE, BLOOD: LIPASE: 31 U/L (ref 11–51)

## 2016-01-11 MED ORDER — DEXTROSE 5 % IV SOLN
1.0000 g | Freq: Once | INTRAVENOUS | Status: AC
  Administered 2016-01-11: 1 g via INTRAVENOUS
  Filled 2016-01-11: qty 10

## 2016-01-11 MED ORDER — KETOROLAC TROMETHAMINE 10 MG PO TABS: 10.0000 mg | ORAL_TABLET | Freq: Three times a day (TID) | ORAL | Status: DC | PRN

## 2016-01-11 MED ORDER — FENTANYL CITRATE (PF) 100 MCG/2ML IJ SOLN
50.0000 ug | Freq: Once | INTRAMUSCULAR | Status: AC
  Administered 2016-01-11: 50 ug via INTRAVENOUS
  Filled 2016-01-11: qty 2

## 2016-01-11 MED ORDER — SODIUM CHLORIDE 0.9 % IV BOLUS (SEPSIS)
1000.0000 mL | Freq: Once | INTRAVENOUS | Status: AC
  Administered 2016-01-11: 1000 mL via INTRAVENOUS

## 2016-01-11 MED ORDER — KETOROLAC TROMETHAMINE 30 MG/ML IJ SOLN
30.0000 mg | Freq: Once | INTRAMUSCULAR | Status: AC
  Administered 2016-01-11: 30 mg via INTRAVENOUS
  Filled 2016-01-11: qty 1

## 2016-01-11 MED ORDER — ONDANSETRON HCL 4 MG/2ML IJ SOLN
4.0000 mg | Freq: Once | INTRAMUSCULAR | Status: AC
  Administered 2016-01-11: 4 mg via INTRAVENOUS
  Filled 2016-01-11: qty 2

## 2016-01-11 MED ORDER — ONDANSETRON HCL 4 MG PO TABS: 4.0000 mg | ORAL_TABLET | Freq: Three times a day (TID) | ORAL | Status: DC | PRN

## 2016-01-11 MED ORDER — CEPHALEXIN 500 MG PO CAPS: 500.0000 mg | ORAL_CAPSULE | Freq: Four times a day (QID) | ORAL | Status: AC

## 2016-01-11 MED ORDER — TRAMADOL HCL 50 MG PO TABS: 100.0000 mg | ORAL_TABLET | Freq: Four times a day (QID) | ORAL | Status: DC | PRN

## 2016-01-11 NOTE — ED Provider Notes (Addendum)
[facility] Emergency Department Provider Note  ____________________________________________   I have reviewed the triage vital signs and the nursing notes.   HISTORY  Chief Complaint Emesis and Flank Pain    HPI Gail [name] is a 45 y.o. female who had a stent placed in the left kidney for a kidney stone by urology here on January 25 presents today with dysuria and urinary frequency starting last night, with subjective fever at home and now flank pain and vomiting. Patient is not on antibiotics. She denies any cough or URI symptoms. Denies significant hematuria. She states the stent as well as place and she is not sure what is supposed to be taken out. The pain is in the left side radiating to the left groin just like her kidney stone. This sharp, nothing makes it better nothing makes it worse. Of note she is allergic to oxycodone but not allergic to any other IV pain medications. She denies pregnancy.  Past Medical History  Diagnosis Date  . Complication of anesthesia     n/v  . Anemia     Patient Active Problem List   Diagnosis Date Noted  . Post-operative state 04/15/2015  . Menorrhagia 04/06/2015  . Chronic pelvic pain in female 04/06/2015  . 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 Dyspareunia 04/06/2015    Past Surgical History  Procedure Laterality Date  . Cesarean section    . Hand tenodesis    . Laparoscopic assisted vaginal hysterectomy N/A 04/15/2015    Procedure: LAPAROSCOPIC ASSISTED VAGINAL HYSTERECTOMY;  Surgeon: Boykin Nearing, MD;  Location: [facility] ORS;  Service: Gynecology;  Laterality: N/A;  . Laparoscopic salpingo oopherectomy Bilateral 04/15/2015    Procedure: LAPAROSCOPIC SALPINGO OOPHORECTOMY;  Surgeon: Boykin Nearing, MD;  Location: [facility] ORS;  Service: Gynecology;  Laterality: Bilateral;  . Cystoscopy N/A 04/15/2015    Procedure: CYSTOSCOPY;  Surgeon: Boykin Nearing, MD;  Location: [facility] ORS;  Service:  Gynecology;  Laterality: N/A;  . Cystoscopy with retrograde pyelogram, ureteroscopy and stent placement Left 12/28/2015    Procedure: CYSTOSCOPY WITH RETROGRADE PYELOGRAM, URETEROSCOPY AND STENT PLACEMENT;  Surgeon: Festus Aloe, MD;  Location: [facility] ORS;  Service: Urology;  Laterality: Left;    Current Outpatient Rx  Name  Route  Sig  Dispense  Refill  . docusate sodium (COLACE) 100 MG capsule   Oral   Take 1 capsule (100 mg total) by mouth 2 (two) times daily. Patient not taking: Reported on 12/28/2015   60 capsule   0   . ketorolac (TORADOL) 10 MG tablet   Oral   Take 1 tablet (10 mg total) by mouth every 8 (eight) hours as needed for moderate pain (with food).   15 tablet   0   . ondansetron (ZOFRAN) 4 MG tablet   Oral   Take 4 mg by mouth every 8 (eight) hours as needed for nausea or vomiting.         Marland Kitchen oxybutynin (DITROPAN) 5 MG tablet   Oral   Take 1 tablet (5 mg total) by mouth every 8 (eight) hours as needed for bladder spasms.   20 tablet   0   . oxyCODONE-acetaminophen (PERCOCET/ROXICET) 5-325 MG tablet   Oral   Take 2 tablets by mouth every 6 (six) hours as needed for severe pain.         . promethazine (PHENERGAN) 12.5 MG tablet   Oral   Take 1 tablet (12.5 mg total) by mouth every 6 (six) hours as  needed for nausea or vomiting.   12 tablet   0   . promethazine (PHENERGAN) 25 MG tablet   Oral   Take 1 tablet (25 mg total) by mouth every 4 (four) hours as needed for nausea or vomiting. Patient not taking: Reported on 12/28/2015   30 tablet   0   . sulfamethoxazole-trimethoprim (BACTRIM DS,SEPTRA DS) 800-160 MG tablet   Oral   Take 1 tablet by mouth 2 (two) times daily.   10 tablet   0   . tamsulosin (FLOMAX) 0.4 MG CAPS capsule   Oral   Take 0.4 mg by mouth daily.         . traMADol (ULTRAM) 50 MG tablet   Oral   Take 2 tablets (100 mg total) by mouth every 6 (six) hours as needed.   30 tablet   0     Allergies Ciprodex and  Oxycodone  History reviewed. No pertinent family history.  Social History Social History  Substance Use Topics  . Smoking status: Never Smoker   . Smokeless tobacco: Never Used  . Alcohol Use: No    Review of Systems Constitutional: Felt like she might have a fever at home Eyes: No visual changes. ENT: No sore throat. No stiff neck no neck pain Cardiovascular: Denies chest pain. Respiratory: Denies shortness of breath. Gastrointestinal:   Positive vomiting.  No diarrhea.  No constipation. Genitourinary: Positive for dysuria. Musculoskeletal: Negative lower extremity swelling Skin: Negative for rash. Neurological: Negative for headaches, focal weakness or numbness. 10-point ROS otherwise negative.  ____________________________________________   PHYSICAL EXAM:  VITAL SIGNS: ED Triage Vitals  Enc Vitals Group     BP 01/11/16 1033 153/88 mmHg     Pulse Rate 01/11/16 1033 77     Resp 01/11/16 1033 20     Temp 01/11/16 1033 97.8 F (36.6 C)     Temp Source 01/11/16 1033 Oral     SpO2 01/11/16 1033 97 %     Weight 01/11/16 1033 115 lb (52.164 kg)     Height 01/11/16 1033 5\' 2"  (1.575 m)     Head Cir --      Peak Flow --      Pain Score 01/11/16 1030 10     Pain Loc --      Pain Edu? --      Excl. in [place]? --     Constitutional: Alert and oriented. Well appearing nontoxic Eyes: Conjunctivae are normal. PERRL. EOMI. Head: Atraumatic. Nose: No congestion/rhinnorhea. Mouth/Throat: Mucous membranes are moist.  Oropharynx non-erythematous. Neck: No stridor.   Nontender with no meningismus Cardiovascular: Normal rate, regular rhythm. Grossly normal heart sounds.  Good peripheral circulation. Respiratory: Normal respiratory effort.  No retractions. Lungs CTAB. Abdominal: Soft and tenderness to palpation noted in the left lower quadrant. No distention. No guarding no rebound Back:  There is no focal tenderness or step off there is no midline tenderness there are no lesions  noted. there is left CVA tenderness Musculoskeletal: No lower extremity tenderness. No joint effusions, no DVT signs strong distal pulses no edema Neurologic:  Normal speech and language. No gross focal neurologic deficits are appreciated.  Skin:  Skin is warm, dry and intact. No rash noted. Psychiatric: Mood and affect are normal. Speech and behavior are normal.  ____________________________________________   LABS (all labs ordered are listed, but only abnormal results are displayed)  Labs Reviewed  URINALYSIS COMPLETEWITH MICROSCOPIC ([place]) - Abnormal; Notable for the following:    Color, Urine  RED (*)    APPearance CLOUDY (*)    Hgb urine dipstick 3+ (*)    Protein, ur 100 (*)    Leukocytes, UA 2+ (*)    Squamous Epithelial / LPF 0-5 (*)    All other components within normal limits  COMPREHENSIVE METABOLIC PANEL - Abnormal; Notable for the following:    Anion gap 4 (*)    All other components within normal limits  CBC WITH DIFFERENTIAL/PLATELET - Abnormal; Notable for the following:    RDW 14.7 (*)    All other components within normal limits  URINE CULTURE  LIPASE, BLOOD   ____________________________________________  EKG   ____________________________________________  RADIOLOGY  I reviewed any imaging ordered by me or triage that were performed during my shift ____________________________________________   PROCEDURES  Procedure(s) performed: None  Critical Care performed: None  ____________________________________________   INITIAL IMPRESSION / ASSESSMENT AND PLAN / ED COURSE  Pertinent labs & imaging results that were available during my care of the patient were reviewed by me and considered in my medical decision making (see chart for details).  Patient with flank pain dysuria subjective fever at home. White count is normal, kidney function is preserved however there is evidence of possible urinary tract infection. We will send a culture, we will  give her IV Rocephin, we'll talk to urology. Patient will be given pain medications and we will continue to assess her closely  Blood pressure 162/90, pulse 74, temperature 97.8 F (36.6 C), temperature source Oral, resp. rate 18, height 5\' 2"  (1.575 m), weight 115 lb (52.164 kg), last menstrual period 03/31/2015, SpO2 98 %. ----------------------------------------- 1:27 PM on 01/11/2016 -----------------------------------------  There is leukorrhea noted that there is no evidence of nitrates and her white count is normal. Did discuss with Dr. Vikki Ports, of [facility]. He feels the patient likely is not infected but he does agree with antibiotics pending culture. He feels that if he get the patient's pain under better control she can go home. He does recommend Toradol which we will administer. He feels this is most likely pain from the stent. Patient is very comfortable with this. Because the stent is actually draining with minimal hydro-he does not feel further instrumentation or intervention is required at this time. I appreciate the consult.   ----------------------------------------- 1:34 PM on 01/11/2016 -----------------------------------------  Patient comfortable this time has no pain has not been vomiting we will give her by mouth challenge and if she can handle that, according to her urologist they would like to see her go home patient very comfortable with this plan does not wish to be in the hospital. Extensive return precautions and follow-up have been given and understood. ____________________________________________   FINAL CLINICAL IMPRESSION(S) / ED DIAGNOSES  Final diagnoses:  Flank pain      This chart was dictated using voice recognition software.  Despite best efforts to proofread,  errors can occur which can change meaning.    Schuyler Amor, MD 01/11/16 [place], MD 01/11/16 [place], MD 01/11/16 [id number]

## 2016-01-11 NOTE — Discharge Instructions (Signed)
If you have increased pain, vomiting, fever, or you feel worse in any way return to the emergency department. Call your urologist as soon as you get home for a outpatient appointment this week.

## 2016-01-11 NOTE — Telephone Encounter (Signed)
Pt's husband called stating pt is vomiting and is bleeding & is going to the ED. He asks if this is normal. Advised him that she needs to go to the ED. Husband voices understanding.

## 2016-01-11 NOTE — ED Notes (Signed)
Pt presents with c/o vomiting and flank pain started this am. Pt just had stent placed last month for kidneys. No active vomiting at this time. No acute distress noted.

## 2016-01-11 NOTE — ED Notes (Signed)
Patient transported to CT 

## 2016-01-12 LAB — URINE CULTURE

## 2016-01-13 ENCOUNTER — Encounter: Payer: Self-pay | Admitting: Urology

## 2016-01-16 ENCOUNTER — Ambulatory Visit (INDEPENDENT_AMBULATORY_CARE_PROVIDER_SITE_OTHER): Payer: Federal, State, Local not specified - PPO | Admitting: Urology

## 2016-01-16 ENCOUNTER — Encounter: Payer: Self-pay | Admitting: Urology

## 2016-01-16 ENCOUNTER — Ambulatory Visit
Admission: RE | Admit: 2016-01-16 | Discharge: 2016-01-16 | Disposition: A | Discharge status: Home / Self Care | Payer: Federal, State, Local not specified - PPO | Source: Ambulatory Visit | Attending: Urology | Admitting: Urology

## 2016-01-16 VITALS — BP 150/82 | HR 60 | Ht 62.0 in | Wt 116.4 lb

## 2016-01-16 DIAGNOSIS — N200 Calculus of kidney: Secondary | ICD-10-CM | POA: Diagnosis not present

## 2016-01-16 DIAGNOSIS — Z87442 Personal history of urinary calculi: Secondary | ICD-10-CM | POA: Diagnosis not present

## 2016-01-16 DIAGNOSIS — R1032 Left lower quadrant pain: Secondary | ICD-10-CM | POA: Insufficient documentation

## 2016-01-16 NOTE — Progress Notes (Signed)
45 year old Asian female who presents today for follow-up of her proximal left ureteral stone. The patient initially underwent ureteroscopy by Dr. Junious Silk and because of the narrowed ureter was unable to navigate the entirety of the ureter and unable to reach the stone. As such, the patient had a stent placed. She follows up today for ongoing management. In the interim, the patient did have an ER visit where she was seen for nausea and flank pain. At that point, she underwent a CT scan which did demonstrate a 3 mm calcification adjacent to the stent in the proximal left ureter.  The patient is complaining of ongoing suprapubic and left flank pain. She also was having epigastric pain and nausea. She is also complaining of a headache. She denies any fevers or chills. She does state that she is having some dysuria.  Current Outpatient Prescriptions on File Prior to Visit  Medication Sig Dispense Refill  . cephALEXin (KEFLEX) 500 MG capsule Take 1 capsule (500 mg total) by mouth 4 (four) times daily. 40 capsule 0  . ketorolac (TORADOL) 10 MG tablet Take 1 tablet (10 mg total) by mouth every 8 (eight) hours as needed for moderate pain (with food). 15 tablet 0  . ondansetron (ZOFRAN) 4 MG tablet Take 1 tablet (4 mg total) by mouth every 8 (eight) hours as needed for nausea or vomiting. 10 tablet 0  . traMADol (ULTRAM) 50 MG tablet Take 2 tablets (100 mg total) by mouth every 6 (six) hours as needed. 15 tablet 0  . docusate sodium (COLACE) 100 MG capsule Take 1 capsule (100 mg total) by mouth 2 (two) times daily. (Patient not taking: Reported on 01/16/2016) 60 capsule 0  . oxybutynin (DITROPAN) 5 MG tablet Take 1 tablet (5 mg total) by mouth every 8 (eight) hours as needed for bladder spasms. (Patient not taking: Reported on 01/16/2016) 20 tablet 0  . oxyCODONE-acetaminophen (PERCOCET/ROXICET) 5-325 MG tablet Take 2 tablets by mouth every 6 (six) hours as needed for severe pain. Reported on 01/16/2016    .  promethazine (PHENERGAN) 12.5 MG tablet Take 1 tablet (12.5 mg total) by mouth every 6 (six) hours as needed for nausea or vomiting. (Patient not taking: Reported on 01/16/2016) 12 tablet 0  . promethazine (PHENERGAN) 25 MG tablet Take 1 tablet (25 mg total) by mouth every 4 (four) hours as needed for nausea or vomiting. (Patient not taking: Reported on 12/28/2015) 30 tablet 0  . sulfamethoxazole-trimethoprim (BACTRIM DS,SEPTRA DS) 800-160 MG tablet Take 1 tablet by mouth 2 (two) times daily. (Patient not taking: Reported on 01/16/2016) 10 tablet 0  . tamsulosin (FLOMAX) 0.4 MG CAPS capsule Take 0.4 mg by mouth daily. Reported on 01/16/2016     No current facility-administered medications on file prior to visit.   Past Medical History  Diagnosis Date  . Complication of anesthesia     n/v  . Anemia   . Episode of syncope 10/08/2013  . Abnormal uterine bleeding 03/24/2015    Last Assessment & Plan:  EMB pathology results still pending.  Plan to follow up with Dr Tressie Ellis as previously planned once results are available.  No bleeding concerns today.     . Calculus of kidney 01/25/2015    Last Assessment & Plan:  While her abdominal pain symptoms are not entirely consistent with the presence of her kidney stone, it is a possible contributor.  Rx provided for motrin and tamulosin to aid with passage of stone.     . 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 Abdominal pain 10/08/2013    Last Assessment & Plan:  Abdominal pain seems multifactorial.  There is a component of pelvic floor myalgia on exam.  Her symptoms are also consistent with possible acute GI viral illness.  No evidence of acute emergent pathology to explain pain.  Advised supportive measures for GI symptoms and pain control with oral medications.  As noted in separate problem, renal stone may also be contributing.  Will follow up in 1-2 weeks to assess progress of symptoms.     . Chronic pelvic pain in female 04/06/2015   Filed Vitals:    01/16/16 1440  BP: 150/82  Pulse: 60   NAD Abdomen is soft Non-labored breathing  UA - consistent with indwelling stent  I have reviewed the patient's most recent CT scan and KUB performed today.  Imp: A 45 year old female who presents today for follow-up of her proximal left ureteral stone. The KUB performed today does not definitively show the stone, however on her CT scan performed 3 days prior the stone is quite visible adjacent to the stent in the proximal left ureter.  Discussion: We discussed the treatment options at this time. I gave her 2 options, one was to remove the stent and see if she passes stone with a dilated ureter. With this option she may develop renal colic and ultimately end up back in the emergency room. As such, I recommended that we proceed to the operating room and remove the stone with ureteroscopy. At the time of the operation, it may or may not be necessary to replace a stent, but distention not need to be in their long after that operation. The patient was fully informed about the procedure and had no additional questions or concerns. We'll try to get her scheduled as soon as possible.

## 2016-01-17 ENCOUNTER — Encounter
Admission: RE | Admit: 2016-01-17 | Discharge: 2016-01-17 | Disposition: A | Discharge status: Home / Self Care | Payer: Federal, State, Local not specified - PPO | Source: Ambulatory Visit | Attending: Urology | Admitting: Urology

## 2016-01-17 ENCOUNTER — Telehealth: Payer: Self-pay | Admitting: Radiology

## 2016-01-17 DIAGNOSIS — N201 Calculus of ureter: Secondary | ICD-10-CM | POA: Diagnosis present

## 2016-01-17 DIAGNOSIS — D649 Anemia, unspecified: Secondary | ICD-10-CM | POA: Diagnosis not present

## 2016-01-17 DIAGNOSIS — Z79899 Other long term (current) drug therapy: Secondary | ICD-10-CM | POA: Diagnosis not present

## 2016-01-17 DIAGNOSIS — R51 Headache: Secondary | ICD-10-CM | POA: Diagnosis not present

## 2016-01-17 DIAGNOSIS — Z87442 Personal history of urinary calculi: Secondary | ICD-10-CM | POA: Diagnosis not present

## 2016-01-17 HISTORY — DX: Nausea with vomiting, unspecified: R11.2

## 2016-01-17 HISTORY — DX: Other specified postprocedural states: Z98.890

## 2016-01-17 LAB — CBC
HCT: 37.5 % (ref 35.0–47.0)
Hemoglobin: 12.6 g/dL (ref 12.0–16.0)
MCH: 27.7 pg (ref 26.0–34.0)
MCHC: 33.5 g/dL (ref 32.0–36.0)
MCV: 82.7 fL (ref 80.0–100.0)
PLATELETS: 244 10*3/uL (ref 150–440)
RBC: 4.54 MIL/uL (ref 3.80–5.20)
RDW: 14.5 % (ref 11.5–14.5)
WBC: 5.3 10*3/uL (ref 3.6–11.0)

## 2016-01-17 LAB — URINALYSIS, COMPLETE
Bilirubin, UA: NEGATIVE
Glucose, UA: NEGATIVE
Ketones, UA: NEGATIVE
NITRITE UA: NEGATIVE
Specific Gravity, UA: >1.03 — ABNORMAL HIGH (ref 1.005–1.030)
UUROB: 0.2 mg/dL (ref 0.2–1.0)
pH, UA: 5.5 (ref 5.0–7.5)

## 2016-01-17 LAB — MICROSCOPIC EXAMINATION: Bacteria, UA: NONE SEEN

## 2016-01-17 NOTE — Telephone Encounter (Signed)
Notified pt of surgery scheduled 01/18/16, pre-admit testing on 2/14 @12 :00 & to call day prior to surgery for arrival time to SDS. Pt voices understanding.

## 2016-01-17 NOTE — Patient Instructions (Signed)
  Your procedure is scheduled on: January 18, 2016 (Wednesday) Report to Day Surgery.[facility]) Second Floor To find out your arrival time please call [phone] between 1PM - 3PM on (Arrival Time 12:00 NOON).  Remember: Instructions that are not followed completely may result in serious medical risk, up to and including death, or upon the discretion of your surgeon and anesthesiologist your surgery may need to be rescheduled.    __x__ 1. Do not eat food or drink liquids after midnight. No gum chewing or hard candies.     __x__ 2. No Alcohol for 24 hours before or after surgery.   ____ 3. Bring all medications with you on the day of surgery if instructed.    __x__ 4. Notify your doctor if there is any change in your medical condition     (cold, fever, infections).     Do not wear jewelry, make-up, hairpins, clips or nail polish.  Do not wear lotions, powders, or perfumes. You may wear deodorant.  Do not shave 48 hours prior to surgery. Men may shave face and neck.  Do not bring valuables to the hospital.    [facility] is not responsible for any belongings or valuables.               Contacts, dentures or bridgework may not be worn into surgery.  Leave your suitcase in the car. After surgery it may be brought to your room.  For patients admitted to the hospital, discharge time is determined by your                treatment team.   Patients discharged the day of surgery will not be allowed to drive home.   Please read over the following fact sheets that you were given:   Surgical Site Infection Prevention   __x__ Take these medicines the morning of surgery with A SIP OF WATER:    1.Gabapentin    ____ Fleet Enema (as directed)   ____ Use CHG Soap as directed  ____ Use inhalers on the day of surgery  ____ Stop metformin 2 days prior to surgery    ____ Take 1/2 of usual insulin dose the night before surgery and none on the morning of surgery.   ____ Stop  Coumadin/Plavix/aspirin on   __x__ Stop Anti-inflammatories on (NO NSAIDS) TYLENOL OK TO TAKE FOR PAIN IF NEEDED   ____ Stop supplements until after surgery.    ____ Bring C-Pap to the hospital.

## 2016-01-18 ENCOUNTER — Ambulatory Visit: Payer: Federal, State, Local not specified - PPO | Admitting: Certified Registered Nurse Anesthetist

## 2016-01-18 ENCOUNTER — Telehealth: Payer: Self-pay

## 2016-01-18 ENCOUNTER — Encounter: Payer: Self-pay | Admitting: *Deleted

## 2016-01-18 ENCOUNTER — Ambulatory Visit
Admission: RE | Admit: 2016-01-18 | Discharge: 2016-01-18 | Disposition: A | Discharge status: Home / Self Care | Payer: Federal, State, Local not specified - PPO | Source: Ambulatory Visit | Attending: Urology | Admitting: Urology

## 2016-01-18 ENCOUNTER — Encounter
Admission: RE | Disposition: A | Discharge status: Home / Self Care | Payer: Self-pay | Source: Ambulatory Visit | Attending: Urology

## 2016-01-18 DIAGNOSIS — Z87442 Personal history of urinary calculi: Secondary | ICD-10-CM | POA: Insufficient documentation

## 2016-01-18 DIAGNOSIS — D649 Anemia, unspecified: Secondary | ICD-10-CM | POA: Insufficient documentation

## 2016-01-18 DIAGNOSIS — N200 Calculus of kidney: Secondary | ICD-10-CM

## 2016-01-18 DIAGNOSIS — Z79899 Other long term (current) drug therapy: Secondary | ICD-10-CM | POA: Insufficient documentation

## 2016-01-18 DIAGNOSIS — R51 Headache: Secondary | ICD-10-CM | POA: Insufficient documentation

## 2016-01-18 DIAGNOSIS — N201 Calculus of ureter: Secondary | ICD-10-CM | POA: Insufficient documentation

## 2016-01-18 HISTORY — PX: URETEROSCOPY WITH HOLMIUM LASER LITHOTRIPSY: SHX6645

## 2016-01-18 HISTORY — PX: CYSTOSCOPY WITH STENT PLACEMENT: SHX5790

## 2016-01-18 LAB — CULTURE, URINE COMPREHENSIVE

## 2016-01-18 SURGERY — URETEROSCOPY, WITH LITHOTRIPSY USING HOLMIUM LASER
Anesthesia: General | Laterality: Left | Wound class: Clean Contaminated

## 2016-01-18 MED ORDER — FAMOTIDINE 20 MG PO TABS
20.0000 mg | ORAL_TABLET | Freq: Once | ORAL | Status: AC
  Administered 2016-01-18: 20 mg via ORAL

## 2016-01-18 MED ORDER — KETOROLAC TROMETHAMINE 30 MG/ML IJ SOLN
INTRAMUSCULAR | Status: DC | PRN
  Administered 2016-01-18: 15 mg via INTRAVENOUS

## 2016-01-18 MED ORDER — FAMOTIDINE 20 MG PO TABS
ORAL_TABLET | ORAL | Status: AC
  Administered 2016-01-18: 20 mg via ORAL
  Filled 2016-01-18: qty 1

## 2016-01-18 MED ORDER — LIDOCAINE HCL (CARDIAC) 20 MG/ML IV SOLN
INTRAVENOUS | Status: DC | PRN
  Administered 2016-01-18: 100 mg via INTRAVENOUS

## 2016-01-18 MED ORDER — ONDANSETRON HCL 4 MG/2ML IJ SOLN
INTRAMUSCULAR | Status: DC | PRN
  Administered 2016-01-18: 4 mg via INTRAVENOUS

## 2016-01-18 MED ORDER — SODIUM CHLORIDE 0.9 % IV SOLN
1.5000 g | Freq: Once | INTRAVENOUS | Status: AC
  Administered 2016-01-18: 1.5 g via INTRAVENOUS
  Filled 2016-01-18: qty 1.5

## 2016-01-18 MED ORDER — PROPOFOL 10 MG/ML IV BOLUS
INTRAVENOUS | Status: DC | PRN
  Administered 2016-01-18: 100 mg via INTRAVENOUS

## 2016-01-18 MED ORDER — MIDAZOLAM HCL 2 MG/2ML IJ SOLN
INTRAMUSCULAR | Status: DC | PRN
  Administered 2016-01-18: 1 mg via INTRAVENOUS

## 2016-01-18 MED ORDER — PROMETHAZINE HCL 25 MG/ML IJ SOLN: 6.2500 mg | INTRAMUSCULAR | Status: DC | PRN

## 2016-01-18 MED ORDER — FENTANYL CITRATE (PF) 100 MCG/2ML IJ SOLN: 25.0000 ug | INTRAMUSCULAR | Status: DC | PRN

## 2016-01-18 MED ORDER — NALOXONE HCL 0.4 MG/ML IJ SOLN
INTRAMUSCULAR | Status: DC | PRN
  Administered 2016-01-18: 80 ug via INTRAVENOUS

## 2016-01-18 MED ORDER — DEXAMETHASONE SODIUM PHOSPHATE 4 MG/ML IJ SOLN
INTRAMUSCULAR | Status: DC | PRN
  Administered 2016-01-18: 5 mg via INTRAVENOUS

## 2016-01-18 MED ORDER — CEPHALEXIN 500 MG PO CAPS
500.0000 mg | ORAL_CAPSULE | Freq: Three times a day (TID) | ORAL | Status: DC
Start: 2016-01-18 — End: 2019-04-06

## 2016-01-18 MED ORDER — KETAMINE HCL 50 MG/ML IJ SOLN
INTRAMUSCULAR | Status: DC | PRN
  Administered 2016-01-18: 20 mg via INTRAVENOUS

## 2016-01-18 MED ORDER — LACTATED RINGERS IV SOLN
INTRAVENOUS | Status: DC
  Administered 2016-01-18: 06:00:00 via INTRAVENOUS

## 2016-01-18 MED ORDER — FENTANYL CITRATE (PF) 100 MCG/2ML IJ SOLN
INTRAMUSCULAR | Status: DC | PRN
  Administered 2016-01-18 (×2): 50 ug via INTRAVENOUS

## 2016-01-18 MED ORDER — GLYCOPYRROLATE 0.2 MG/ML IJ SOLN
INTRAMUSCULAR | Status: DC | PRN
  Administered 2016-01-18: 0.2 mg via INTRAVENOUS

## 2016-01-18 SURGICAL SUPPLY — 29 items
BACTOSHIELD CHG 4% 4OZ (MISCELLANEOUS) ×2
BASKET ZERO TIP 1.9FR (BASKET) ×3 IMPLANT
CATH URETL 5X70 OPEN END (CATHETERS) ×3 IMPLANT
CNTNR SPEC 2.5X3XGRAD LEK (MISCELLANEOUS) ×1
CONT SPEC 4OZ STER OR WHT (MISCELLANEOUS) ×2
CONTAINER SPEC 2.5X3XGRAD LEK (MISCELLANEOUS) ×1 IMPLANT
FEE TECHNICIAN ONLY PER HOUR (MISCELLANEOUS) IMPLANT
GLOVE BIO SURGEON STRL SZ7 (GLOVE) ×6 IMPLANT
GLOVE BIO SURGEON STRL SZ7.5 (GLOVE) ×3 IMPLANT
GOWN STRL REUS W/ TWL LRG LVL4 (GOWN DISPOSABLE) ×1 IMPLANT
GOWN STRL REUS W/TWL LRG LVL4 (GOWN DISPOSABLE) ×2
GOWN STRL REUS W/TWL XL LVL3 (GOWN DISPOSABLE) ×3 IMPLANT
GUIDEWIRE SUPER STIFF (WIRE) IMPLANT
KIT RM TURNOVER CYSTO AR (KITS) ×3 IMPLANT
LASER FIBER 200M SMARTSCOPE (Laser) ×3 IMPLANT
LASER HOLMIUM FIBER SU 272UM (MISCELLANEOUS) IMPLANT
PACK CYSTO AR (MISCELLANEOUS) ×3 IMPLANT
SCRUB CHG 4% DYNA-HEX 4OZ (MISCELLANEOUS) ×1 IMPLANT
SENSORWIRE 0.038 NOT ANGLED (WIRE) ×3
SET CYSTO W/LG BORE CLAMP LF (SET/KITS/TRAYS/PACK) ×3 IMPLANT
SHEATH URETERAL 13/15X36 1L (SHEATH) IMPLANT
SOL .9 NS 3000ML IRR  AL (IV SOLUTION) ×2
SOL .9 NS 3000ML IRR UROMATIC (IV SOLUTION) ×1 IMPLANT
STENT URET 6FRX24 CONTOUR (STENTS) ×3 IMPLANT
STENT URET 6FRX26 CONTOUR (STENTS) ×3 IMPLANT
SURGILUBE 2OZ TUBE FLIPTOP (MISCELLANEOUS) ×3 IMPLANT
SYRINGE IRR TOOMEY STRL 70CC (SYRINGE) ×3 IMPLANT
WATER STERILE IRR 1000ML POUR (IV SOLUTION) ×3 IMPLANT
WIRE SENSOR 0.038 NOT ANGLED (WIRE) ×1 IMPLANT

## 2016-01-18 NOTE — Telephone Encounter (Signed)
-----   Message from Nickie Retort, MD sent at 01/18/2016  8:01 AM EST ----- Patient needs to follow up in one month with a renal ultrasound just prior to the appt. Thanks

## 2016-01-18 NOTE — H&P (View-Only) (Signed)
45 year old Asian female who presents today for follow-up of her proximal left ureteral stone. The patient initially underwent ureteroscopy by Dr. Junious Silk and because of the narrowed ureter was unable to navigate the entirety of the ureter and unable to reach the stone. As such, the patient had a stent placed. She follows up today for ongoing management. In the interim, the patient did have an ER visit where she was seen for nausea and flank pain. At that point, she underwent a CT scan which did demonstrate a 3 mm calcification adjacent to the stent in the proximal left ureter.  The patient is complaining of ongoing suprapubic and left flank pain. She also was having epigastric pain and nausea. She is also complaining of a headache. She denies any fevers or chills. She does state that she is having some dysuria.  Current Outpatient Prescriptions on File Prior to Visit  Medication Sig Dispense Refill  . cephALEXin (KEFLEX) 500 MG capsule Take 1 capsule (500 mg total) by mouth 4 (four) times daily. 40 capsule 0  . ketorolac (TORADOL) 10 MG tablet Take 1 tablet (10 mg total) by mouth every 8 (eight) hours as needed for moderate pain (with food). 15 tablet 0  . ondansetron (ZOFRAN) 4 MG tablet Take 1 tablet (4 mg total) by mouth every 8 (eight) hours as needed for nausea or vomiting. 10 tablet 0  . traMADol (ULTRAM) 50 MG tablet Take 2 tablets (100 mg total) by mouth every 6 (six) hours as needed. 15 tablet 0  . docusate sodium (COLACE) 100 MG capsule Take 1 capsule (100 mg total) by mouth 2 (two) times daily. (Patient not taking: Reported on 01/16/2016) 60 capsule 0  . oxybutynin (DITROPAN) 5 MG tablet Take 1 tablet (5 mg total) by mouth every 8 (eight) hours as needed for bladder spasms. (Patient not taking: Reported on 01/16/2016) 20 tablet 0  . oxyCODONE-acetaminophen (PERCOCET/ROXICET) 5-325 MG tablet Take 2 tablets by mouth every 6 (six) hours as needed for severe pain. Reported on 01/16/2016    .  promethazine (PHENERGAN) 12.5 MG tablet Take 1 tablet (12.5 mg total) by mouth every 6 (six) hours as needed for nausea or vomiting. (Patient not taking: Reported on 01/16/2016) 12 tablet 0  . promethazine (PHENERGAN) 25 MG tablet Take 1 tablet (25 mg total) by mouth every 4 (four) hours as needed for nausea or vomiting. (Patient not taking: Reported on 12/28/2015) 30 tablet 0  . sulfamethoxazole-trimethoprim (BACTRIM DS,SEPTRA DS) 800-160 MG tablet Take 1 tablet by mouth 2 (two) times daily. (Patient not taking: Reported on 01/16/2016) 10 tablet 0  . tamsulosin (FLOMAX) 0.4 MG CAPS capsule Take 0.4 mg by mouth daily. Reported on 01/16/2016     No current facility-administered medications on file prior to visit.   Past Medical History  Diagnosis Date  . Complication of anesthesia     n/v  . Anemia   . Episode of syncope 10/08/2013  . Abnormal uterine bleeding 03/24/2015    Last Assessment & Plan:  EMB pathology results still pending.  Plan to follow up with Dr Tressie Ellis as previously planned once results are available.  No bleeding concerns today.     . Calculus of kidney 01/25/2015    Last Assessment & Plan:  While her abdominal pain symptoms are not entirely consistent with the presence of her kidney stone, it is a possible contributor.  Rx provided for motrin and tamulosin to aid with passage of stone.     . 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 Abdominal pain 10/08/2013    Last Assessment & Plan:  Abdominal pain seems multifactorial.  There is a component of pelvic floor myalgia on exam.  Her symptoms are also consistent with possible acute GI viral illness.  No evidence of acute emergent pathology to explain pain.  Advised supportive measures for GI symptoms and pain control with oral medications.  As noted in separate problem, renal stone may also be contributing.  Will follow up in 1-2 weeks to assess progress of symptoms.     . Chronic pelvic pain in female 04/06/2015   Filed Vitals:    01/16/16 1440  BP: 150/82  Pulse: 60   NAD Abdomen is soft Non-labored breathing  UA - consistent with indwelling stent  I have reviewed the patient's most recent CT scan and KUB performed today.  Imp: A 45 year old female who presents today for follow-up of her proximal left ureteral stone. The KUB performed today does not definitively show the stone, however on her CT scan performed 3 days prior the stone is quite visible adjacent to the stent in the proximal left ureter.  Discussion: We discussed the treatment options at this time. I gave her 2 options, one was to remove the stent and see if she passes stone with a dilated ureter. With this option she may develop renal colic and ultimately end up back in the emergency room. As such, I recommended that we proceed to the operating room and remove the stone with ureteroscopy. At the time of the operation, it may or may not be necessary to replace a stent, but distention not need to be in their long after that operation. The patient was fully informed about the procedure and had no additional questions or concerns. We'll try to get her scheduled as soon as possible.

## 2016-01-18 NOTE — Telephone Encounter (Signed)
Done   Thanks,  Sharyn Lull

## 2016-01-18 NOTE — Op Note (Signed)
Date of procedure: 01/18/2016  Preoperative diagnosis:  1. Left ureteral calculus   Postoperative diagnosis:  1. Left ureteral calculus   Procedure: 1. Cystoscopy 2. Left ureteroscopy 3. Stone basketing 4. Left ureteral stent removal  Surgeon: Baruch Gouty, MD  Anesthesia: General  Complications: None  Intraoperative findings: 3 mm proximal left ureteral stone  EBL: None  Specimens: Left ureteral stone  Drains: None  Disposition: Stable to the postanesthesia care unit  Indication for procedure: The patient is a 45 y.o. female with 3 mm left ureteral stone status post stent placement after failed ureteroscopy a few weeks ago presents for definitive management.  After reviewing the management options for treatment, the patient elected to proceed with the above surgical procedure(s). We have discussed the potential benefits and risks of the procedure, side effects of the proposed treatment, the likelihood of the patient achieving the goals of the procedure, and any potential problems that might occur during the procedure or recuperation. Informed consent has been obtained.  Description of procedure: The patient was met in the preoperative area. All risks, benefits, and indications of the procedure were described in great detail. The patient consented to the procedure. Preoperative antibiotics were given. The patient was taken to the operative theater. General anesthesia was induced per the anesthesia service. The patient was then placed in the dorsal lithotomy position and prepped and draped in the usual sterile fashion. A preoperative timeout was called. A 21 French 30 cystoscope was inserted into the patient's bladder per urethra atraumatically. Left ureteral stent was grasped flexible graspers brought to level the urethral meatus. A sensor wire was then exchanged through the stent the stent was removed. The sensor was confirmed to be in the renal pelvis on fluoroscopy. A semirigid  ureters was inserted in the patient's bladder and into the left ureter. In the proximal ureter of the 3 mm stone was visualized. Its caliber was much smaller than that of the ureter. This basket the stone basket. It was removed with minimal trauma and sent to pathology. Pan ureteroscopy following this was unremarkable from the stones restrictor trauma. At this time the decision was made not to leave a ureteral stent. The ureteroscope was removed. The patient's bladder was drained. The patient was woke from anesthesia and transferred stable condition to the postanesthesia care unit without apparent palpitations  Plan: The patient will follow-up in one month for renal ultrasound to rule out hydronephrosis  Baruch Gouty, M.D.

## 2016-01-18 NOTE — Transfer of Care (Signed)
Immediate Anesthesia Transfer of Care Note  Patient: Gail [name]  Procedure(s) Performed: Procedure(s): URETEROSCOPY WITH STONE EXTRACTION (Left) CYSTOSCOPY WITH STENT REMOVAL (Left)  Patient Location: PACU  Anesthesia Type:General  Level of Consciousness: sedated  Airway & Oxygen Therapy: Patient Spontanous Breathing and Patient connected to nasal cannula oxygen  Post-op Assessment: Report given to RN and Post -op Vital signs reviewed and stable  Post vital signs: Reviewed and stable  Last Vitals:  Filed Vitals:   01/18/16 0608  BP: 125/77  Pulse: 67  Temp: 36.6 C  Resp: 18    Complications: No apparent anesthesia complications

## 2016-01-18 NOTE — Interval H&P Note (Signed)
History and Physical Interval Note:  01/18/2016 7:15 AM  Gail [name]  has presented today for surgery, with the diagnosis of left ureteral stone  The various methods of treatment have been discussed with the patient and family. After consideration of risks, benefits and other options for treatment, the patient has consented to  Procedure(s): URETEROSCOPY WITH HOLMIUM LASER LITHOTRIPSY (Left) CYSTOSCOPY WITH STENT PLACEMENT/exchange (Left) as a surgical intervention .  The patient's history has been reviewed, patient examined, no change in status, stable for surgery.  I have reviewed the patient's chart and labs.  Questions were answered to the patient's satisfaction.    RRR Unlabored respiration  Horald Pollen [place]

## 2016-01-18 NOTE — Anesthesia Preprocedure Evaluation (Signed)
Anesthesia Evaluation  Patient identified by MRN, date of birth, ID band Patient awake    Reviewed: Allergy & Precautions, H&P , NPO status , Patient's Chart, lab work & pertinent test results, reviewed documented beta blocker date and time   History of Anesthesia Complications (+) PONV and history of anesthetic complications  Airway Mallampati: III  TM Distance: >3 FB Neck ROM: full    Dental no notable dental hx. (+) Teeth Intact, Missing   Pulmonary neg pulmonary ROS,    Pulmonary exam normal breath sounds clear to auscultation       Cardiovascular Exercise Tolerance: Good negative cardio ROS Normal cardiovascular exam Rhythm:regular Rate:Normal     Neuro/Psych negative neurological ROS  negative psych ROS   GI/Hepatic negative GI ROS, Neg liver ROS,   Endo/Other  negative endocrine ROS  Renal/GU Renal disease (kidney stones)  negative genitourinary   Musculoskeletal   Abdominal   Peds  Hematology  (+) Blood dyscrasia, anemia ,   Anesthesia Other Findings Past Medical History:   Complication of anesthesia                                     Comment:n/v   Anemia                                                       Episode of syncope                              10/08/2013    Abnormal uterine bleeding                       03/24/2015      Comment:Last Assessment & Plan:  EMB pathology results               still pending.  Plan to follow up with Dr               Tressie Ellis as previously planned once results are               available.  No bleeding concerns today.      Calculus of kidney                              01/25/2015      Comment:Last Assessment & Plan:  While her abdominal               pain symptoms are not entirely consistent with               the presence of her kidney stone, it is a               possible contributor.  Rx provided for motrin               and tamulosin to aid with passage of stone.       Dermoid cyst of ovary                           04/06/2015       Comment:right    Abdominal pain  10/08/2013      Comment:Last Assessment & Plan:  Abdominal pain seems               multifactorial.  There is a component of pelvic              floor myalgia on exam.  Her symptoms are also               consistent with possible acute GI viral               illness.  No evidence of acute emergent               pathology to explain pain.  Advised supportive               measures for GI symptoms and pain control with               oral medications.  As noted in separate               problem, renal stone may also be contributing.               Will follow up in 1-2 weeks to assess progress               of symptoms.      Chronic pelvic pain in female                   04/06/2015     PONV (postoperative nausea and vomiting)                     Reproductive/Obstetrics negative OB ROS                             Anesthesia Physical Anesthesia Plan  ASA: I  Anesthesia Plan: General   Post-op Pain Management:    Induction:   Airway Management Planned:   Additional Equipment:   Intra-op Plan:   Post-operative Plan:   Informed Consent: I have reviewed the patients History and Physical, chart, labs and discussed the procedure including the risks, benefits and alternatives for the proposed anesthesia with the patient or authorized representative who has indicated his/her understanding and acceptance.   Dental Advisory Given  Plan Discussed with: Anesthesiologist, CRNA and Surgeon  Anesthesia Plan Comments:         Anesthesia Quick Evaluation

## 2016-01-18 NOTE — Anesthesia Procedure Notes (Signed)
Procedure Name: LMA Insertion Date/Time: 01/18/2016 7:35 AM Performed by: Rosaria Ferries, Kaytee Taliercio Pre-anesthesia Checklist: Patient identified, Emergency Drugs available, Suction available and Patient being monitored Patient Re-evaluated:Patient Re-evaluated prior to inductionOxygen Delivery Method: Circle system utilized Preoxygenation: Pre-oxygenation with 100% oxygen Intubation Type: IV induction LMA Size: 3.5 Placement Confirmation: breath sounds checked- equal and bilateral Dental Injury: Teeth and Oropharynx as per pre-operative assessment

## 2016-01-18 NOTE — Anesthesia Postprocedure Evaluation (Signed)
Anesthesia Post Note  Patient: Zainab P Oriordan  Procedure(s) Performed: Procedure(s) (LRB): URETEROSCOPY WITH STONE EXTRACTION (Left) CYSTOSCOPY WITH STENT REMOVAL (Left)  Patient location during evaluation: PACU Anesthesia Type: General Level of consciousness: awake and alert Pain management: pain level controlled Vital Signs Assessment: post-procedure vital signs reviewed and stable Respiratory status: spontaneous breathing, nonlabored ventilation, respiratory function stable and patient connected to nasal cannula oxygen Cardiovascular status: blood pressure returned to baseline and stable Postop Assessment: no signs of nausea or vomiting Anesthetic complications: no    Last Vitals:  Filed Vitals:   01/18/16 0901 01/18/16 0941  BP: 156/78 139/74  Pulse: 68 69  Temp: 35.9 C 36.2 C  Resp: 14 14    Last Pain:  Filed Vitals:   01/18/16 0942  PainSc: 0-No pain                 Martha Clan

## 2016-01-18 NOTE — Discharge Instructions (Signed)
General Anesthesia, Adult °General anesthesia is a sleep-like state of non-feeling produced by medicines (anesthetics). General anesthesia prevents you from being alert and feeling pain during a medical procedure. Your caregiver may recommend general anesthesia if your procedure: °· Is long. °· Is painful or uncomfortable. °· Would be frightening to see or hear. °· Requires you to be still. °· Affects your breathing. °· Causes significant blood loss. °LET YOUR CAREGIVER KNOW ABOUT: °· Allergies to food or medicine. °· Medicines taken, including vitamins, herbs, eyedrops, over-the-counter medicines, and creams. °· Use of steroids (by mouth or creams). °· Previous problems with anesthetics or numbing medicines, including problems experienced by relatives. °· History of bleeding problems or blood clots. °· Previous surgeries and types of anesthetics received. °· Possibility of pregnancy, if this applies. °· Use of cigarettes, alcohol, or illegal drugs. °· Any health condition(s), especially diabetes, sleep apnea, and high blood pressure. °RISKS AND COMPLICATIONS °General anesthesia rarely causes complications. However, if complications do occur, they can be life threatening. Complications include: °· A lung infection. °· A stroke. °· A heart attack. °· Waking up during the procedure. When this occurs, the patient may be unable to move and communicate that he or she is awake. The patient may feel severe pain. °Older adults and adults with serious medical problems are more likely to have complications than adults who are young and healthy. Some complications can be prevented by answering all of your caregiver's questions thoroughly and by following all pre-procedure instructions. It is important to tell your caregiver if any of the pre-procedure instructions, especially those related to diet, were not followed. Any food or liquid in the stomach can cause problems when you are under general anesthesia. °BEFORE THE  PROCEDURE °· Ask your caregiver if you will have to spend the night at the hospital. If you will not have to spend the night, arrange to have an adult drive you and stay with you for 24 hours. °· Follow your caregiver's instructions if you are taking dietary supplements or medicines. Your caregiver may tell you to stop taking them or to reduce your dosage. °· Do not smoke for as long as possible before your procedure. If possible, stop smoking 3-6 weeks before the procedure. °· Do not take new dietary supplements or medicines within 1 week of your procedure unless your caregiver approves them. °· Do not eat within 8 hours of your procedure or as directed by your caregiver. Drink only clear liquids, such as water, black coffee (without milk or cream), and fruit juices (without pulp). °· Do not drink within 3 hours of your procedure or as directed by your caregiver. °· You may brush your teeth on the morning of the procedure, but make sure to spit out the toothpaste and water when finished. °PROCEDURE  °You will receive anesthetics through a mask, through an intravenous (IV) access tube, or through both. A doctor who specializes in anesthesia (anesthesiologist) or a nurse who specializes in anesthesia (nurse anesthetist) or both will stay with you throughout the procedure to make sure you remain unconscious. He or she will also watch your blood pressure, pulse, and oxygen levels to make sure that the anesthetics do not cause any problems. Once you are asleep, a breathing tube or mask may be used to help you breathe. °AFTER THE PROCEDURE °You will wake up after the procedure is complete. You may be in the room where the procedure was performed or in a recovery area. You may have a sore throat   if a breathing tube was used. You may also feel: °· Dizzy. °· Weak. °· Drowsy. °· Confused. °· Nauseous. °· Cold. °These are all normal responses and can be expected to last for up to 24 hours after the procedure is complete. A  caregiver will tell you when you are ready to go home. This will usually be when you are fully awake and in stable condition. °  °This information is not intended to replace advice given to you by your health care provider. Make sure you discuss any questions you have with your health care provider. °  °Document Released: 02/26/2008 Document Revised: 12/10/2014 Document Reviewed: 03/19/2012 °Elsevier Interactive Patient Education ©2016 [employer] ° °

## 2016-01-19 ENCOUNTER — Encounter: Payer: Self-pay | Admitting: Urology

## 2016-01-20 ENCOUNTER — Encounter: Payer: Self-pay | Admitting: Radiology

## 2016-01-20 ENCOUNTER — Encounter: Payer: Self-pay | Admitting: Urology

## 2016-01-23 ENCOUNTER — Telehealth: Payer: Self-pay

## 2016-01-23 NOTE — Telephone Encounter (Signed)
Pt walked in c/o continued pain in flank area that radiates to the abd. Made pt aware there is not stent or stone to be causing pain and therefore Dr. Pilar Jarvis would not write her out of work any longer. Pt husband stated that he was going to take pt to the ER over the weekend but pt refused. Made pt aware to take pain medication and alternate with tylenol and ibuprofen. Pt then stated she has been having chest pain. Made pt and husband aware to call PCP in terms of the chest pain. Pt and husband voiced understanding of the conversation.

## 2016-01-24 LAB — STONE ANALYSIS
CA PHOS CRY STONE QL IR: 10 %
Ca Oxalate,Dihydrate: 10 %
Ca Oxalate,Monohydr.: 80 %
Stone Weight KSTONE: 17 mg

## 2016-02-15 ENCOUNTER — Ambulatory Visit: Payer: Federal, State, Local not specified - PPO

## 2016-02-16 ENCOUNTER — Ambulatory Visit: Payer: Federal, State, Local not specified - PPO

## 2018-06-21 ENCOUNTER — Emergency Department: Payer: Federal, State, Local not specified - PPO

## 2018-06-21 ENCOUNTER — Encounter: Payer: Self-pay | Admitting: Emergency Medicine

## 2018-06-21 ENCOUNTER — Other Ambulatory Visit: Payer: Self-pay

## 2018-06-21 ENCOUNTER — Emergency Department
Admission: EM | Admit: 2018-06-21 | Discharge: 2018-06-21 | Disposition: A | Discharge status: Home / Self Care | Payer: Federal, State, Local not specified - PPO | Attending: Emergency Medicine | Admitting: Emergency Medicine

## 2018-06-21 DIAGNOSIS — J069 Acute upper respiratory infection, unspecified: Secondary | ICD-10-CM | POA: Diagnosis not present

## 2018-06-21 DIAGNOSIS — J029 Acute pharyngitis, unspecified: Secondary | ICD-10-CM | POA: Diagnosis present

## 2018-06-21 MED ORDER — BENZONATATE 100 MG PO CAPS: 100.0000 mg | ORAL_CAPSULE | Freq: Three times a day (TID) | ORAL | 0 refills | Status: DC | PRN

## 2018-06-21 MED ORDER — AZITHROMYCIN 250 MG PO TABS: ORAL_TABLET | ORAL | 0 refills | Status: DC

## 2018-06-21 NOTE — ED Triage Notes (Signed)
Sore throat x 2 weeks

## 2018-06-21 NOTE — ED Provider Notes (Signed)
[facility] Emergency Department Provider Note  ____________________________________________  Time seen: Approximately 2:28 PM  I have reviewed the triage vital signs and the nursing notes.   HISTORY  Chief Complaint Sore Throat    HPI Gail [name] is a 47 y.o. female that presents to the  emergency department for evaluation of sore throat and productive cough for 2 weeks.  She feels like she has phlegm caught in her throat.  Patient states that she feels warm every night but has not checked her temperature.  She does not smoke.  No asthma or allergies. Her partner was recently sick with similar symptoms.  No nasal congestion, nausea, vomiting, abdominal pain.,    Past Medical History:  Diagnosis Date  . Abdominal pain 10/08/2013   Last Assessment & Plan:  Abdominal pain seems multifactorial.  There is a component of pelvic floor myalgia on exam.  Her symptoms are also consistent with possible acute GI viral illness.  No evidence of acute emergent pathology to explain pain.  Advised supportive measures for GI symptoms and pain control with oral medications.  As noted in separate problem, renal stone may also be contributing.  Will follow up in 1-2 weeks to assess progress of symptoms.     . Abnormal uterine bleeding 03/24/2015   Last Assessment & Plan:  EMB pathology results still pending.  Plan to follow up with Dr Tressie Ellis as previously planned once results are available.  No bleeding concerns today.     . Anemia   . Calculus of kidney 01/25/2015   Last Assessment & Plan:  While her abdominal pain symptoms are not entirely consistent with the presence of her kidney stone, it is a possible contributor.  Rx provided for motrin and tamulosin to aid with passage of stone.     . Chronic pelvic pain in female 04/06/2015  . Complication of anesthesia    n/v  . 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 Episode of syncope 10/08/2013  . PONV (postoperative nausea and  vomiting)     Patient Active Problem List   Diagnosis Date Noted  . Post-operative state 04/15/2015  . Menorrhagia 04/06/2015  . Chronic pelvic pain in female 04/06/2015  . 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 Dyspareunia 04/06/2015  . Abnormal uterine bleeding 03/24/2015  . Calculus of kidney 01/25/2015  . 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 Abdominal pain 10/08/2013  . Episode of syncope 10/08/2013  . Irregular bleeding 02/25/2013    Past Surgical History:  Procedure Laterality Date  . ABDOMINAL HYSTERECTOMY    . CESAREAN SECTION    . CYSTOSCOPY N/A 04/15/2015   Procedure: CYSTOSCOPY;  Surgeon: Boykin Nearing, MD;  Location: [facility] ORS;  Service: Gynecology;  Laterality: N/A;  . CYSTOSCOPY WITH RETROGRADE PYELOGRAM, URETEROSCOPY AND STENT PLACEMENT Left 12/28/2015   Procedure: CYSTOSCOPY WITH RETROGRADE PYELOGRAM, URETEROSCOPY AND STENT PLACEMENT;  Surgeon: Festus Aloe, MD;  Location: [facility] ORS;  Service: Urology;  Laterality: Left;  . CYSTOSCOPY WITH STENT PLACEMENT Left 01/18/2016   Procedure: CYSTOSCOPY WITH STENT REMOVAL;  Surgeon: Nickie Retort, MD;  Location: [facility] ORS;  Service: Urology;  Laterality: Left;  . HAND TENODESIS    . LAPAROSCOPIC ASSISTED VAGINAL HYSTERECTOMY N/A 04/15/2015   Procedure: LAPAROSCOPIC ASSISTED VAGINAL HYSTERECTOMY;  Surgeon: Boykin Nearing, MD;  Location: [facility] ORS;  Service: Gynecology;  Laterality: N/A;  . LAPAROSCOPIC SALPINGO OOPHERECTOMY Bilateral 04/15/2015   Procedure: LAPAROSCOPIC SALPINGO OOPHORECTOMY;  Surgeon: Boykin Nearing, MD;  Location: [facility] ORS;  Service: Gynecology;  Laterality: Bilateral;  . URETEROSCOPY WITH HOLMIUM LASER LITHOTRIPSY Left 01/18/2016   Procedure: URETEROSCOPY WITH STONE EXTRACTION;  Surgeon: Nickie Retort, MD;  Location: [facility] ORS;  Service: Urology;  Laterality: Left;    Prior to Admission medications   Medication Sig Start Date End Date Taking? Authorizing Provider  acetaminophen  (TYLENOL) 500 MG tablet Take 500 mg by mouth every 6 (six) hours as needed.    [provider]  azithromycin (ZITHROMAX Z-PAK) 250 MG tablet Take 2 tablets (500 mg) on  Day 1,  followed by 1 tablet (250 mg) once daily on Days 2 through 5. 06/21/18   Laban Emperor, PA-C  benzonatate (TESSALON PERLES) 100 MG capsule Take 1 capsule (100 mg total) by mouth 3 (three) times daily as needed for cough. 06/21/18 06/21/19  Laban Emperor, PA-C  cephALEXin (KEFLEX) 500 MG capsule Take 1 capsule (500 mg total) by mouth 3 (three) times daily. 01/18/16   Nickie Retort, MD  docusate sodium (COLACE) 100 MG capsule Take 1 capsule (100 mg total) by mouth 2 (two) times daily. Patient not taking: Reported on 01/16/2016 04/16/15   Schermerhorn, Gwen Her, MD  gabapentin (NEURONTIN) 100 MG capsule Take 100 mg by mouth as needed.  03/16/15 03/15/16  [provider]  ketorolac (TORADOL) 10 MG tablet Take 1 tablet (10 mg total) by mouth every 8 (eight) hours as needed for moderate pain (with food). 01/11/16   Schuyler Amor, MD  meperidine (DEMEROL) 50 MG tablet Take 50 mg by mouth as needed.  05/03/15   [provider]  ondansetron (ZOFRAN) 4 MG tablet Take 1 tablet (4 mg total) by mouth every 8 (eight) hours as needed for nausea or vomiting. 01/11/16   Schuyler Amor, MD  oxyCODONE-acetaminophen (PERCOCET/ROXICET) 5-325 MG tablet Take 2 tablets by mouth every 6 (six) hours as needed for severe pain. Reported on 01/16/2016    [provider]  promethazine (PHENERGAN) 12.5 MG tablet Take 1 tablet (12.5 mg total) by mouth every 6 (six) hours as needed for nausea or vomiting. Patient not taking: Reported on 01/16/2016 12/28/15   Festus Aloe, MD  promethazine (PHENERGAN) 25 MG tablet Take 1 tablet (25 mg total) by mouth every 4 (four) hours as needed for nausea or vomiting. Patient not taking: Reported on 12/28/2015 04/16/15   Schermerhorn, Gwen Her, MD  promethazine (PHENERGAN) 25 MG tablet  Take by mouth. Reported on 01/18/2016    [provider]  traMADol (ULTRAM) 50 MG tablet Take 2 tablets (100 mg total) by mouth every 6 (six) hours as needed. 01/11/16   Schuyler Amor, MD    Allergies Ciprodex [ciprofloxacin-dexamethasone]; Oxycodone; and Sulfamethoxazole-trimethoprim  No family history on file.  Social History Social History   Tobacco Use  . Smoking status: Never Smoker  . Smokeless tobacco: Never Used  Substance Use Topics  . Alcohol use: No  . Drug use: No     Review of Systems  Eyes: No visual changes. No discharge. ENT: Negative for congestion and rhinorrhea. Cardiovascular: No chest pain. Respiratory: Positive for cough. No SOB. Gastrointestinal: No abdominal pain.  No nausea, no vomiting.  Musculoskeletal: Negative for musculoskeletal pain. Skin: Negative for rash, abrasions, lacerations, ecchymosis.   ____________________________________________   PHYSICAL EXAM:  VITAL SIGNS: ED Triage Vitals  Enc Vitals Group     BP 06/21/18 1330 (!) 144/83     Pulse Rate 06/21/18 1330 67     Resp 06/21/18 1330  18     Temp 06/21/18 1330 98.3 F (36.8 C)     Temp Source 06/21/18 1330 Oral     SpO2 06/21/18 1330 100 %     Weight 06/21/18 1332 115 lb (52.2 kg)     Height 06/21/18 1332 5\' 2"  (1.575 m)     Head Circumference --      Peak Flow --      Pain Score 06/21/18 1331 5     Pain Loc --      Pain Edu? --      Excl. in [place]? --      Constitutional: Alert and oriented. Well appearing and in no acute distress. Eyes: Conjunctivae are normal. PERRL. EOMI. No discharge. Head: Atraumatic. ENT: No frontal and maxillary sinus tenderness.      Ears: Tympanic membranes pearly gray with good landmarks. No discharge.      Nose: no congestion/rhinnorhea.      Mouth/Throat: Mucous membranes are moist. Oropharynx erythematous. Tonsils not enlarged. No exudates. Uvula midline. Neck: No stridor.   Hematological/Lymphatic/Immunilogical: No cervical  lymphadenopathy. Cardiovascular: Normal rate, regular rhythm.  Good peripheral circulation. Respiratory: Normal respiratory effort without tachypnea or retractions. Lungs CTAB. Good air entry to the bases with no decreased or absent breath sounds. Gastrointestinal: Bowel sounds 4 quadrants. Soft and nontender to palpation. No guarding or rigidity. No palpable masses. No distention. Musculoskeletal: Full range of motion to all extremities. No gross deformities appreciated. Neurologic:  Normal speech and language. No gross focal neurologic deficits are appreciated.  Skin:  Skin is warm, dry and intact. No rash noted. Psychiatric: Mood and affect are normal. Speech and behavior are normal. Patient exhibits appropriate insight and judgement.   ____________________________________________   LABS (all labs ordered are listed, but only abnormal results are displayed)  Labs Reviewed - No data to display ____________________________________________  EKG   ____________________________________________  RADIOLOGY Robinette Haines, personally viewed and evaluated these images (plain radiographs) as part of my medical decision making, as well as reviewing the written report by the radiologist.  Dg Chest 2 View  Result Date: 06/21/2018 CLINICAL DATA:  Productive cough 2 weeks. EXAM: CHEST - 2 VIEW COMPARISON:  None. FINDINGS: Lungs are adequately inflated and otherwise clear. Cardiomediastinal silhouette, bones and soft tissues are normal. IMPRESSION: No active cardiopulmonary disease. Electronically Signed   By: Marin Olp M.D.   On: 06/21/2018 15:08    ____________________________________________    PROCEDURES  Procedure(s) performed:    Procedures    Medications - No data to display   ____________________________________________   INITIAL IMPRESSION / ASSESSMENT AND PLAN / ED COURSE  Pertinent labs & imaging results that were available during my care of the patient were  reviewed by me and considered in my medical decision making (see chart for details).  Review of the [place] CSRS was performed in accordance of the [place] prior to dispensing any controlled drugs.     Patient's diagnosis is consistent with URI. Vital signs and exam are reassuring.  Chest x-ray negative for acute cardiopulmonary processes.  Patient is allergic to Ciprodex and not sure if she can take steroids.  Patient appears well and is staying well hydrated. Patient feels comfortable going home. Patient will be discharged home with prescriptions for azithromycin and Tessalon Perles. Patient is to follow up with PCP as needed or otherwise directed. Patient is given ED precautions to return to the ED for any worsening or new symptoms.     ____________________________________________  FINAL CLINICAL IMPRESSION(S) /  ED DIAGNOSES  Final diagnoses:  Upper respiratory tract infection, unspecified type      NEW MEDICATIONS STARTED DURING THIS VISIT:  ED Discharge Orders        Ordered    azithromycin (ZITHROMAX Z-PAK) 250 MG tablet     06/21/18 1516    benzonatate (TESSALON PERLES) 100 MG capsule  3 times daily PRN     06/21/18 1516          This chart was dictated using voice recognition software/Dragon. Despite best efforts to proofread, errors can occur which can change the meaning. Any change was purely unintentional.    Laban Emperor, PA-C 06/21/18 [place], Randall An, MD 06/22/18 [phone]

## 2019-03-03 ENCOUNTER — Other Ambulatory Visit: Payer: Self-pay

## 2019-03-03 ENCOUNTER — Emergency Department
Admission: EM | Admit: 2019-03-03 | Discharge: 2019-03-03 | Disposition: A | Discharge status: Home / Self Care | Payer: Federal, State, Local not specified - PPO | Attending: Emergency Medicine | Admitting: Emergency Medicine

## 2019-03-03 ENCOUNTER — Emergency Department: Payer: Federal, State, Local not specified - PPO

## 2019-03-03 DIAGNOSIS — R42 Dizziness and giddiness: Secondary | ICD-10-CM | POA: Insufficient documentation

## 2019-03-03 DIAGNOSIS — R1110 Vomiting, unspecified: Secondary | ICD-10-CM | POA: Diagnosis not present

## 2019-03-03 DIAGNOSIS — I951 Orthostatic hypotension: Secondary | ICD-10-CM | POA: Insufficient documentation

## 2019-03-03 DIAGNOSIS — R51 Headache: Secondary | ICD-10-CM | POA: Diagnosis not present

## 2019-03-03 LAB — CBC
HCT: 40.4 % (ref 36.0–46.0)
Hemoglobin: 13.1 g/dL (ref 12.0–15.0)
MCH: 28.4 pg (ref 26.0–34.0)
MCHC: 32.4 g/dL (ref 30.0–36.0)
MCV: 87.4 fL (ref 80.0–100.0)
PLATELETS: 232 10*3/uL (ref 150–400)
RBC: 4.62 MIL/uL (ref 3.87–5.11)
RDW: 12.8 % (ref 11.5–15.5)
WBC: 4.9 10*3/uL (ref 4.0–10.5)
nRBC: 0 % (ref 0.0–0.2)

## 2019-03-03 LAB — URINALYSIS, COMPLETE (UACMP) WITH MICROSCOPIC
Bacteria, UA: NONE SEEN
Bilirubin Urine: NEGATIVE
GLUCOSE, UA: NEGATIVE mg/dL
HGB URINE DIPSTICK: NEGATIVE
Ketones, ur: NEGATIVE mg/dL
LEUKOCYTE UA: NEGATIVE
NITRITE: NEGATIVE
PH: 6 (ref 5.0–8.0)
PROTEIN: NEGATIVE mg/dL
Specific Gravity, Urine: 1.008 (ref 1.005–1.030)

## 2019-03-03 LAB — COMPREHENSIVE METABOLIC PANEL
ALT: 32 U/L (ref 0–44)
ANION GAP: 8 (ref 5–15)
AST: 28 U/L (ref 15–41)
Albumin: 4.2 g/dL (ref 3.5–5.0)
Alkaline Phosphatase: 74 U/L (ref 38–126)
BUN: 17 mg/dL (ref 6–20)
CO2: 27 mmol/L (ref 22–32)
Calcium: 9.2 mg/dL (ref 8.9–10.3)
Chloride: 106 mmol/L (ref 98–111)
Creatinine, Ser: 0.61 mg/dL (ref 0.44–1.00)
Glucose, Bld: 131 mg/dL — ABNORMAL HIGH (ref 70–99)
POTASSIUM: 3.5 mmol/L (ref 3.5–5.1)
Sodium: 141 mmol/L (ref 135–145)
Total Bilirubin: 0.6 mg/dL (ref 0.3–1.2)
Total Protein: 7.1 g/dL (ref 6.5–8.1)

## 2019-03-03 LAB — TROPONIN I

## 2019-03-03 LAB — LIPASE, BLOOD: LIPASE: 32 U/L (ref 11–51)

## 2019-03-03 MED ORDER — SODIUM CHLORIDE 0.9 % IV BOLUS
1000.0000 mL | Freq: Once | INTRAVENOUS | Status: AC
  Administered 2019-03-03: 1000 mL via INTRAVENOUS

## 2019-03-03 NOTE — ED Notes (Signed)
PA in room to assess patient.  Will continue to monitor.

## 2019-03-03 NOTE — Discharge Instructions (Signed)
Your bloodwork is reassuring. Your blood pressure dropped when you stood up in the emergency department. Please increase your fluid intake. Follow up with primary care for recheck.

## 2019-03-03 NOTE — ED Triage Notes (Signed)
Pt states was wearing a mask last night at work when she became dizzy and began to vomit. Pt states she feels dizziness when she moves around. Pt denies shob, cough, nasal congestion, fever.

## 2019-03-03 NOTE — ED Provider Notes (Signed)
[facility] Emergency Department Provider Note  ____________________________________________  Time seen: Approximately 6:32 PM  I have reviewed the triage vital signs and the nursing notes.   HISTORY  Chief Complaint Dizziness and Emesis    HPI Gail [name] is a 48 y.o. female that presents to the emergency department for evaluation of episodes of dizziness at work today and yesterday.  Patient vomited last night and again once early this morning.  Last episode of vomiting was at 5 AM.  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  She states that it is very hot where she is working.  She has had a slight mild frontal headache.  No sick contacts.  She takes allergy medications daily.  No fever, chills, nasal congestion, sore throat, cough, shortness of breath, chest pain, abdominal pain.   Past Medical History:  Diagnosis Date  . Abdominal pain 10/08/2013   Last Assessment & Plan:  Abdominal pain seems multifactorial.  There is a component of pelvic floor myalgia on exam.  Her symptoms are also consistent with possible acute GI viral illness.  No evidence of acute emergent pathology to explain pain.  Advised supportive measures for GI symptoms and pain control with oral medications.  As noted in separate problem, renal stone may also be contributing.  Will follow up in 1-2 weeks to assess progress of symptoms.     . Abnormal uterine bleeding 03/24/2015   Last Assessment & Plan:  EMB pathology results still pending.  Plan to follow up with Dr Tressie Ellis as previously planned once results are available.  No bleeding concerns today.     . Anemia   . Calculus of kidney 01/25/2015   Last Assessment & Plan:  While her abdominal pain symptoms are not entirely consistent with the presence of her kidney stone, it is a  possible contributor.  Rx provided for motrin and tamulosin to aid with passage of stone.     . Chronic pelvic pain in female 04/06/2015  . Complication of anesthesia    n/v  . 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 Episode of syncope 10/08/2013  . PONV (postoperative nausea and vomiting)     Patient Active Problem List   Diagnosis Date Noted  . Post-operative state 04/15/2015  . Menorrhagia 04/06/2015  . Chronic pelvic pain in female 04/06/2015  . 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 Dyspareunia 04/06/2015  . Abnormal uterine bleeding 03/24/2015  . Calculus of kidney 01/25/2015  . 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 Abdominal pain 10/08/2013  . Episode of syncope 10/08/2013  . Irregular bleeding 02/25/2013    Past Surgical History:  Procedure Laterality Date  . ABDOMINAL HYSTERECTOMY    . CESAREAN SECTION    . CYSTOSCOPY N/A 04/15/2015   Procedure: CYSTOSCOPY;  Surgeon: Boykin Nearing, MD;  Location: [facility] ORS;  Service: Gynecology;  Laterality: N/A;  . CYSTOSCOPY WITH RETROGRADE PYELOGRAM, URETEROSCOPY AND STENT PLACEMENT Left 12/28/2015   Procedure: CYSTOSCOPY WITH RETROGRADE PYELOGRAM, URETEROSCOPY AND STENT PLACEMENT;  Surgeon: Festus Aloe, MD;  Location: [facility] ORS;  Service: Urology;  Laterality: Left;  . CYSTOSCOPY WITH STENT PLACEMENT Left 01/18/2016   Procedure: CYSTOSCOPY WITH STENT REMOVAL;  Surgeon: Nickie Retort, MD;  Location: [facility] ORS;  Service: Urology;  Laterality: Left;  . HAND TENODESIS    .  LAPAROSCOPIC ASSISTED VAGINAL HYSTERECTOMY N/A 04/15/2015   Procedure: LAPAROSCOPIC ASSISTED VAGINAL HYSTERECTOMY;  Surgeon: Boykin Nearing, MD;  Location: [facility] ORS;  Service: Gynecology;  Laterality: N/A;  . LAPAROSCOPIC SALPINGO OOPHERECTOMY Bilateral 04/15/2015   Procedure: LAPAROSCOPIC SALPINGO OOPHORECTOMY;  Surgeon: Boykin Nearing, MD;  Location: [facility] ORS;  Service: Gynecology;  Laterality: Bilateral;  . URETEROSCOPY WITH HOLMIUM LASER  LITHOTRIPSY Left 01/18/2016   Procedure: URETEROSCOPY WITH STONE EXTRACTION;  Surgeon: Nickie Retort, MD;  Location: [facility] ORS;  Service: Urology;  Laterality: Left;    Prior to Admission medications   Medication Sig Start Date End Date Taking? Authorizing Provider  acetaminophen (TYLENOL) 500 MG tablet Take 500 mg by mouth every 6 (six) hours as needed.    [provider]  azithromycin (ZITHROMAX Z-PAK) 250 MG tablet Take 2 tablets (500 mg) on  Day 1,  followed by 1 tablet (250 mg) once daily on Days 2 through 5. 06/21/18   Laban Emperor, PA-C  benzonatate (TESSALON PERLES) 100 MG capsule Take 1 capsule (100 mg total) by mouth 3 (three) times daily as needed for cough. 06/21/18 06/21/19  Laban Emperor, PA-C  cephALEXin (KEFLEX) 500 MG capsule Take 1 capsule (500 mg total) by mouth 3 (three) times daily. 01/18/16   Nickie Retort, MD  docusate sodium (COLACE) 100 MG capsule Take 1 capsule (100 mg total) by mouth 2 (two) times daily. Patient not taking: Reported on 01/16/2016 04/16/15   Schermerhorn, Gwen Her, MD  gabapentin (NEURONTIN) 100 MG capsule Take 100 mg by mouth as needed.  03/16/15 03/15/16  [provider]  ketorolac (TORADOL) 10 MG tablet Take 1 tablet (10 mg total) by mouth every 8 (eight) hours as needed for moderate pain (with food). 01/11/16   Schuyler Amor, MD  meperidine (DEMEROL) 50 MG tablet Take 50 mg by mouth as needed.  05/03/15   [provider]  ondansetron (ZOFRAN) 4 MG tablet Take 1 tablet (4 mg total) by mouth every 8 (eight) hours as needed for nausea or vomiting. 01/11/16   Schuyler Amor, MD  oxyCODONE-acetaminophen (PERCOCET/ROXICET) 5-325 MG tablet Take 2 tablets by mouth every 6 (six) hours as needed for severe pain. Reported on 01/16/2016    [provider]  promethazine (PHENERGAN) 12.5 MG tablet Take 1 tablet (12.5 mg total) by mouth every 6 (six) hours as needed for nausea or vomiting. Patient not taking: Reported on  01/16/2016 12/28/15   Festus Aloe, MD  promethazine (PHENERGAN) 25 MG tablet Take 1 tablet (25 mg total) by mouth every 4 (four) hours as needed for nausea or vomiting. Patient not taking: Reported on 12/28/2015 04/16/15   Schermerhorn, Gwen Her, MD  promethazine (PHENERGAN) 25 MG tablet Take by mouth. Reported on 01/18/2016    [provider]  traMADol (ULTRAM) 50 MG tablet Take 2 tablets (100 mg total) by mouth every 6 (six) hours as needed. 01/11/16   Schuyler Amor, MD    Allergies Ciprodex [ciprofloxacin-dexamethasone]; Oxycodone; and Sulfamethoxazole-trimethoprim  No family history on file.  Social History Social History   Tobacco Use  . Smoking status: Never Smoker  . Smokeless tobacco: Never Used  Substance Use Topics  . Alcohol use: No  . Drug use: No     Review of Systems  Constitutional: No fever/chills ENT: No upper respiratory complaints. Cardiovascular: No chest pain. Respiratory: No cough. No SOB. Gastrointestinal: No abdominal pain.  No nausea, no vomiting.  Musculoskeletal: Negative for musculoskeletal pain. Skin: Negative for rash, abrasions,  lacerations, ecchymosis. Neurological: Negative for numbness or tingling. Positive for mild headache.   ____________________________________________   PHYSICAL EXAM:  VITAL SIGNS: ED Triage Vitals [03/03/19 1809]  Enc Vitals Group     BP (!) 168/80     Pulse Rate 69     Resp 16     Temp 98.4 F (36.9 C)     Temp Source Oral     SpO2 98 %     Weight 112 lb (50.8 kg)     Height 5\' 2"  (1.575 m)     Head Circumference      Peak Flow      Pain Score 0     Pain Loc      Pain Edu?      Excl. in [place]?      Constitutional: Alert and oriented. Well appearing and in no acute distress. Eyes: Conjunctivae are normal. PERRL. EOMI. Head: Atraumatic. ENT:      Ears:      Nose: No congestion/rhinnorhea.      Mouth/Throat: Mucous membranes are moist.  Neck: No stridor.   Cardiovascular: Normal rate,  regular rhythm.  Good peripheral circulation. Respiratory: Normal respiratory effort without tachypnea or retractions. Lungs CTAB. Good air entry to the bases with no decreased or absent breath sounds. Gastrointestinal: Bowel sounds 4 quadrants. Soft and nontender to palpation. No guarding or rigidity. No palpable masses. No distention.  Musculoskeletal: Full range of motion to all extremities. No gross deformities appreciated. Neurologic:  Normal speech and language. No gross focal neurologic deficits are appreciated.  Skin:  Skin is warm, dry and intact. No rash noted. Psychiatric: Mood and affect are normal. Speech and behavior are normal. Patient exhibits appropriate insight and judgement.   ____________________________________________   LABS (all labs ordered are listed, but only abnormal results are displayed)  Labs Reviewed  COMPREHENSIVE METABOLIC PANEL - Abnormal; Notable for the following components:      Result Value   Glucose, Bld 131 (*)    All other components within normal limits  URINALYSIS, COMPLETE (UACMP) WITH MICROSCOPIC - Abnormal; Notable for the following components:   Color, Urine YELLOW (*)    APPearance CLEAR (*)    All other components within normal limits  CBC  TROPONIN I  LIPASE, BLOOD   ____________________________________________  EKG   ____________________________________________  RADIOLOGY Robinette Haines, personally viewed and evaluated these images (plain radiographs) as part of my medical decision making, as well as reviewing the written report by the radiologist.  Dg Chest Portable 1 View  Result Date: 03/03/2019 CLINICAL DATA:  Dizziness. EXAM: PORTABLE CHEST 1 VIEW COMPARISON:  Radiographs of June 21, 2018. FINDINGS: The heart size and mediastinal contours are within normal limits. Both lungs are clear. No pneumothorax or pleural effusion is noted. The visualized skeletal structures are unremarkable. IMPRESSION: No active disease.  Electronically Signed   By: Marijo Conception, M.D.   On: 03/03/2019 19:04    ____________________________________________    PROCEDURES  Procedure(s) performed:    Procedures    Medications  sodium chloride 0.9 % bolus 1,000 mL (0 mLs Intravenous Stopped 03/03/19 2049)     ____________________________________________   INITIAL IMPRESSION / ASSESSMENT AND PLAN / ED COURSE  Pertinent labs & imaging results that were available during my care of the patient were reviewed by me and considered in my medical decision making (see chart for details).  Review of the [place] CSRS was performed in accordance of the [place] prior to dispensing any controlled  drugs.    Patient presented emergency department for evaluation of episodes of dizziness during work today.  Vital signs and exam are reassuring.  Lab work largely unremarkable.  Patient did not have any dizziness while in the emergency department.  Patient's blood pressure dropped from sitting and laying to standing in the emergency department.  She states the dizziness only happen when she was working hard in a hot environment.  Patient likely needs increased fluid intake.  Diagnosis is consistent with orthostatic hypotension.  Patient is to follow up with PCP as directed. Patient is given ED precautions to return to the ED for any worsening or new symptoms.     ____________________________________________  FINAL CLINICAL IMPRESSION(S) / ED DIAGNOSES  Final diagnoses:  Orthostatic hypotension  Dizziness      NEW MEDICATIONS STARTED DURING THIS VISIT:  ED Discharge Orders    None          This chart was dictated using voice recognition software/Dragon. Despite best efforts to proofread, errors can occur which can change the meaning. Any change was purely unintentional.    Laban Emperor, PA-C 03/03/19 2138    Nance Pear, MD 03/03/19 2139

## 2019-03-03 NOTE — ED Provider Notes (Signed)
  EKG  I, Nance Pear, attending physician, personally viewed and interpreted this EKG  EKG Time: 1811 Rate: 64 Rhythm: normal sinus rhythm Axis: normal Intervals: qtc 64 QRS: narrow ST changes: no st elevation Impression: abnormal Val Eagle, MD 03/03/19 1816

## 2019-04-06 ENCOUNTER — Other Ambulatory Visit: Payer: Self-pay

## 2019-04-06 ENCOUNTER — Emergency Department: Payer: No Typology Code available for payment source

## 2019-04-06 ENCOUNTER — Emergency Department
Admission: EM | Admit: 2019-04-06 | Discharge: 2019-04-06 | Disposition: A | Discharge status: Home / Self Care | Payer: No Typology Code available for payment source | Attending: Emergency Medicine | Admitting: Emergency Medicine

## 2019-04-06 ENCOUNTER — Encounter: Payer: Self-pay | Admitting: Emergency Medicine

## 2019-04-06 DIAGNOSIS — S39012A Strain of muscle, fascia and tendon of lower back, initial encounter: Secondary | ICD-10-CM

## 2019-04-06 DIAGNOSIS — Y929 Unspecified place or not applicable: Secondary | ICD-10-CM | POA: Insufficient documentation

## 2019-04-06 DIAGNOSIS — Y999 Unspecified external cause status: Secondary | ICD-10-CM | POA: Diagnosis not present

## 2019-04-06 DIAGNOSIS — X58XXXA Exposure to other specified factors, initial encounter: Secondary | ICD-10-CM | POA: Insufficient documentation

## 2019-04-06 DIAGNOSIS — S3992XA Unspecified injury of lower back, initial encounter: Secondary | ICD-10-CM | POA: Diagnosis present

## 2019-04-06 DIAGNOSIS — Y939 Activity, unspecified: Secondary | ICD-10-CM | POA: Insufficient documentation

## 2019-04-06 DIAGNOSIS — Z79899 Other long term (current) drug therapy: Secondary | ICD-10-CM | POA: Diagnosis not present

## 2019-04-06 LAB — URINALYSIS, COMPLETE (UACMP) WITH MICROSCOPIC
Bacteria, UA: NONE SEEN
Bilirubin Urine: NEGATIVE
Glucose, UA: NEGATIVE mg/dL
Hgb urine dipstick: NEGATIVE
Ketones, ur: NEGATIVE mg/dL
Nitrite: NEGATIVE
Protein, ur: NEGATIVE mg/dL
Specific Gravity, Urine: 1.013 (ref 1.005–1.030)
pH: 5 (ref 5.0–8.0)

## 2019-04-06 MED ORDER — KETOROLAC TROMETHAMINE 30 MG/ML IJ SOLN
30.0000 mg | Freq: Once | INTRAMUSCULAR | Status: AC
Start: 2019-04-06 — End: 2019-04-06
  Administered 2019-04-06: 12:00:00 30 mg via INTRAMUSCULAR
  Filled 2019-04-06: qty 1

## 2019-04-06 MED ORDER — CYCLOBENZAPRINE HCL 10 MG PO TABS
10.0000 mg | ORAL_TABLET | Freq: Once | ORAL | Status: AC
  Administered 2019-04-06: 10 mg via ORAL
  Filled 2019-04-06: qty 1

## 2019-04-06 MED ORDER — PREDNISONE 10 MG (21) PO TBPK: ORAL_TABLET | ORAL | 0 refills | Status: DC

## 2019-04-06 MED ORDER — BACLOFEN 10 MG PO TABS: 10.0000 mg | ORAL_TABLET | Freq: Every day | ORAL | 1 refills | Status: DC

## 2019-04-06 NOTE — Discharge Instructions (Addendum)
Follow-up with your regular doctor if not better in 5 to 7 days.  Return emergency department if needed.  Take medications as prescribed.  Apply wet heat to the lower back followed by ice.

## 2019-04-06 NOTE — ED Triage Notes (Signed)
Presents with lower back pain  States pain started 2 days ago w/o injury

## 2019-04-06 NOTE — ED Provider Notes (Signed)
[facility] Emergency Department Provider Note  ____________________________________________   First MD Initiated Contact with Patient 04/06/19 1106     (approximate)  I have reviewed the triage vital signs and the nursing notes.   HISTORY  Chief Complaint Back Pain    HPI Gail [name] is a 48 y.o. female presents to the emergency department with C/o low back pain for 2 day, no known injury, pain is worse with movement, increased with bending over, denies numbness, tingling, or changes in bowel/urinary habits,  Using otc meds without relief Remainder ros neg, patient does have a history of kidney stones.   Past Medical History:  Diagnosis Date  . Abdominal pain 10/08/2013   Last Assessment & Plan:  Abdominal pain seems multifactorial.  There is a component of pelvic floor myalgia on exam.  Her symptoms are also consistent with possible acute GI viral illness.  No evidence of acute emergent pathology to explain pain.  Advised supportive measures for GI symptoms and pain control with oral medications.  As noted in separate problem, renal stone may also be contributing.  Will follow up in 1-2 weeks to assess progress of symptoms.     . Abnormal uterine bleeding 03/24/2015   Last Assessment & Plan:  EMB pathology results still pending.  Plan to follow up with Dr Tressie Ellis as previously planned once results are available.  No bleeding concerns today.     . Anemia   . Calculus of kidney 01/25/2015   Last Assessment & Plan:  While her abdominal pain symptoms are not entirely consistent with the presence of her kidney stone, it is a possible contributor.  Rx provided for motrin and tamulosin to aid with passage of stone.     . Chronic pelvic pain in female 04/06/2015  . Complication of anesthesia    n/v  . 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 Episode of syncope 10/08/2013  . PONV (postoperative nausea and vomiting)     Patient Active Problem List   Diagnosis Date Noted  . Post-operative state 04/15/2015  . Menorrhagia 04/06/2015  . Chronic pelvic pain in female 04/06/2015  . 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 Dyspareunia 04/06/2015  . Abnormal uterine bleeding 03/24/2015  . Calculus of kidney 01/25/2015  . 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 Abdominal pain 10/08/2013  . Episode of syncope 10/08/2013  . Irregular bleeding 02/25/2013    Past Surgical History:  Procedure Laterality Date  . ABDOMINAL HYSTERECTOMY    . CESAREAN SECTION    . CYSTOSCOPY N/A 04/15/2015   Procedure: CYSTOSCOPY;  Surgeon: Boykin Nearing, MD;  Location: [facility] ORS;  Service: Gynecology;  Laterality: N/A;  . CYSTOSCOPY WITH RETROGRADE PYELOGRAM, URETEROSCOPY AND STENT PLACEMENT Left 12/28/2015   Procedure: CYSTOSCOPY WITH RETROGRADE PYELOGRAM, URETEROSCOPY AND STENT PLACEMENT;  Surgeon: Festus Aloe, MD;  Location: [facility] OR[facility];  Service: Urology;  Laterality: Left;  . CYSTOSCOPY WITH STENT PLACEMENT Left 01/18/2016   Procedure: CYSTOSCOPY WITH STENT REMOVAL;  Surgeon: Nickie Retort, MD;  Location: [facility] ORS;  Service: Urology;  Laterality: Left;  . HAND TENODESIS    . LAPAROSCOPIC ASSISTED VAGINAL HYSTERECTOMY N/A 04/15/2015   Procedure: LAPAROSCOPIC ASSISTED VAGINAL HYSTERECTOMY;  Surgeon: Boykin Nearing, MD;  Location: [facility] OR[facility];  Service: Gynecology;  Laterality: N/A;  . LAPAROSCOPIC SALPINGO OOPHERECTOMY Bilateral 04/15/2015   Procedure: LAPAROSCOPIC SALPINGO OOPHORECTOMY;  Surgeon: Boykin Nearing, MD;  Location: [facility];  Service: Gynecology;  Laterality: Bilateral;  .  URETEROSCOPY WITH HOLMIUM LASER LITHOTRIPSY Left 01/18/2016   Procedure: URETEROSCOPY WITH STONE EXTRACTION;  Surgeon: Nickie Retort, MD;  Location: [facility] ORS;  Service: Urology;  Laterality: Left;    Prior to Admission medications   Medication Sig Start Date End Date Taking? Authorizing Provider  acetaminophen (TYLENOL) 500 MG tablet Take 500 mg by mouth every 6  (six) hours as needed.    [provider]  baclofen (LIORESAL) 10 MG tablet Take 1 tablet (10 mg total) by mouth daily. 04/06/19 04/05/20  Trigg Delarocha, Linden Dolin, PA-C  predniSONE (STERAPRED UNI-PAK 21 TAB) 10 MG (21) TBPK tablet Take 6 pills on day one then decrease by 1 pill each day 04/06/19   Versie Starks, PA-C  promethazine (PHENERGAN) 12.5 MG tablet Take 1 tablet (12.5 mg total) by mouth every 6 (six) hours as needed for nausea or vomiting. Patient not taking: Reported on 01/16/2016 12/28/15   Festus Aloe, MD    Allergies Ciprodex [ciprofloxacin-dexamethasone]; Oxycodone; and Sulfamethoxazole-trimethoprim  No family history on file.  Social History Social History   Tobacco Use  . Smoking status: Never Smoker  . Smokeless tobacco: Never Used  Substance Use Topics  . Alcohol use: No  . Drug use: No    Review of Systems  Constitutional: No fever/chills Eyes: No visual changes. ENT: No sore throat. Respiratory: Denies cough Genitourinary: Negative for dysuria. Musculoskeletal: Positive for back pain. Skin: Negative for rash.    ____________________________________________   PHYSICAL EXAM:  VITAL SIGNS: ED Triage Vitals  Enc Vitals Group     BP 04/06/19 1054 137/71     Pulse Rate 04/06/19 1054 62     Resp --      Temp 04/06/19 1054 98.3 F (36.8 C)     Temp Source 04/06/19 1054 Oral     SpO2 04/06/19 1054 100 %     Weight 04/06/19 1054 112 lb (50.8 kg)     Height 04/06/19 1054 5\' 2"  (1.575 m)     Head Circumference --      Peak Flow --      Pain Score 04/06/19 1100 9     Pain Loc --      Pain Edu? --      Excl. in [place]? --     Constitutional: Alert and oriented. Well appearing and in no acute distress. Eyes: Conjunctivae are normal.  Head: Atraumatic. Nose: No congestion/rhinnorhea. Mouth/Throat: Mucous membranes are moist.   Neck:  supple no lymphadenopathy noted Cardiovascular: Normal rate, regular rhythm. Heart sounds are normal Respiratory:  Normal respiratory effort.  No retractions, lungs c t a  GU: deferred Musculoskeletal:  Decreased rom of back due to discomfort, lumbar spine minimally tender, negative Slr, full strength in great toes b/l, full strength in lower legs, n/v intact, patient has difficulty going from the wheelchair to the stretcher Neurologic:  Normal speech and language.  Skin:  Skin is warm, dry and intact. No rash noted. Psychiatric: Mood and affect are normal. Speech and behavior are normal.  ____________________________________________   LABS (all labs ordered are listed, but only abnormal results are displayed)  Labs Reviewed  URINALYSIS, COMPLETE (UACMP) WITH MICROSCOPIC - Abnormal; Notable for the following components:      Result Value   Color, Urine YELLOW (*)    APPearance CLEAR (*)    Leukocytes,Ua TRACE (*)    All other components within normal limits   ____________________________________________   ____________________________________________  RADIOLOGY  X-ray of the lumbar spine is negative for any  acute abnormality  ____________________________________________   PROCEDURES  Procedure(s) performed: Toradol 30 mg IM, Flexeril 10 mg p.o. no  Procedures    ____________________________________________   INITIAL IMPRESSION / ASSESSMENT AND PLAN / ED COURSE  Pertinent labs & imaging results that were available during my care of the patient were reviewed by me and considered in my medical decision making (see chart for details).   Patient is 48 year old female presents emergency department complaining of low back pain.  States she moves boxes a lot at work.  She denies any numbness or tingling.  Has a history of kidney stones.  Physical exam shows that back pain is reproduced with movement.  She has difficulty going from the wheelchair to the stretcher.  Lumbar spine is mildly tender.  Negative straight leg raise.  Neurovascular is intact.  Toradol 30 mg IM and Flexeril 10 mg  p.o. were given  X-ray of the lumbar spine is negative for any acute abnormality UA is negative for any blood or signs of kidney stone   Dr. Corky Downs in to see the patient   Explained all of the findings to the patient.  She was instructed to take a steroid pack and muscle relaxer.  She is to follow-up with orthopedics if not better in 1 week.  She states she understands and will comply.  She was given a work note to return on Thursday.  She was discharged in stable condition.     As part of my medical decision making, I reviewed the following data within the [place] notes reviewed and incorporated, Labs reviewed UA is normal, Old chart reviewed, Radiograph reviewed x-ray lumbar spine is negative, Evaluated by EM attending Dr. Corky Downs, Notes from prior ED visits and [place] Controlled Substance Database  ____________________________________________   FINAL CLINICAL IMPRESSION(S) / ED DIAGNOSES  Final diagnoses:  Acute myofascial strain of lumbar region, initial encounter      NEW MEDICATIONS STARTED DURING THIS VISIT:  Discharge Medication List as of 04/06/2019 12:43 PM    START taking these medications   Details  baclofen (LIORESAL) 10 MG tablet Take 1 tablet (10 mg total) by mouth daily., Starting Mon 04/06/2019, Until Tue 04/05/2020, Normal    predniSONE (STERAPRED UNI-PAK 21 TAB) 10 MG (21) TBPK tablet Take 6 pills on day one then decrease by 1 pill each day, Normal         Note:  This document was prepared using Dragon voice recognition software and may include unintentional dictation errors.     Versie Starks, PA-C 04/06/19 1303    Lavonia Drafts, MD 04/06/19 1311

## 2019-06-16 DIAGNOSIS — M25561 Pain in right knee: Secondary | ICD-10-CM | POA: Insufficient documentation

## 2019-06-16 DIAGNOSIS — M545 Low back pain, unspecified: Secondary | ICD-10-CM | POA: Insufficient documentation

## 2019-06-22 ENCOUNTER — Other Ambulatory Visit: Payer: Self-pay | Admitting: Orthopedic Surgery

## 2019-06-22 ENCOUNTER — Other Ambulatory Visit (HOSPITAL_COMMUNITY): Payer: Self-pay | Admitting: Orthopedic Surgery

## 2019-06-22 DIAGNOSIS — M5441 Lumbago with sciatica, right side: Secondary | ICD-10-CM

## 2019-06-22 DIAGNOSIS — M5416 Radiculopathy, lumbar region: Secondary | ICD-10-CM

## 2019-06-30 ENCOUNTER — Other Ambulatory Visit: Payer: Self-pay

## 2019-06-30 ENCOUNTER — Ambulatory Visit
Admission: RE | Admit: 2019-06-30 | Discharge: 2019-06-30 | Disposition: A | Discharge status: Home / Self Care | Payer: No Typology Code available for payment source | Source: Ambulatory Visit | Attending: Orthopedic Surgery | Admitting: Orthopedic Surgery

## 2019-06-30 ENCOUNTER — Other Ambulatory Visit: Payer: Federal, State, Local not specified - PPO

## 2019-06-30 DIAGNOSIS — M5416 Radiculopathy, lumbar region: Secondary | ICD-10-CM | POA: Diagnosis present

## 2019-06-30 DIAGNOSIS — M5441 Lumbago with sciatica, right side: Secondary | ICD-10-CM | POA: Insufficient documentation

## 2019-07-06 ENCOUNTER — Other Ambulatory Visit: Payer: Federal, State, Local not specified - PPO

## 2019-07-27 ENCOUNTER — Other Ambulatory Visit: Payer: Self-pay

## 2019-07-27 ENCOUNTER — Encounter: Payer: Self-pay | Admitting: Emergency Medicine

## 2019-07-27 ENCOUNTER — Emergency Department
Admission: EM | Admit: 2019-07-27 | Discharge: 2019-07-27 | Disposition: A | Discharge status: Home / Self Care | Payer: No Typology Code available for payment source | Attending: Emergency Medicine | Admitting: Emergency Medicine

## 2019-07-27 DIAGNOSIS — M549 Dorsalgia, unspecified: Secondary | ICD-10-CM | POA: Diagnosis present

## 2019-07-27 DIAGNOSIS — Z79899 Other long term (current) drug therapy: Secondary | ICD-10-CM | POA: Insufficient documentation

## 2019-07-27 DIAGNOSIS — M5431 Sciatica, right side: Secondary | ICD-10-CM | POA: Diagnosis not present

## 2019-07-27 LAB — URINALYSIS, COMPLETE (UACMP) WITH MICROSCOPIC
Bacteria, UA: NONE SEEN
Bilirubin Urine: NEGATIVE
Glucose, UA: NEGATIVE mg/dL
Hgb urine dipstick: NEGATIVE
Ketones, ur: NEGATIVE mg/dL
Nitrite: NEGATIVE
Protein, ur: NEGATIVE mg/dL
Specific Gravity, Urine: 1.02 (ref 1.005–1.030)
pH: 7 (ref 5.0–8.0)

## 2019-07-27 MED ORDER — ONDANSETRON 4 MG PO TBDP
4.0000 mg | ORAL_TABLET | Freq: Once | ORAL | Status: AC
  Administered 2019-07-27: 22:00:00 4 mg via ORAL
  Filled 2019-07-27: qty 1

## 2019-07-27 MED ORDER — METHOCARBAMOL 500 MG PO TABS: 500.0000 mg | ORAL_TABLET | Freq: Three times a day (TID) | ORAL | 0 refills | Status: AC | PRN

## 2019-07-27 MED ORDER — PREDNISONE 10 MG (21) PO TBPK: ORAL_TABLET | ORAL | 0 refills | Status: DC

## 2019-07-27 MED ORDER — HYDROCODONE-ACETAMINOPHEN 5-325 MG PO TABS
1.0000 | ORAL_TABLET | Freq: Once | ORAL | Status: AC
  Administered 2019-07-27: 1 via ORAL
  Filled 2019-07-27: qty 1

## 2019-07-27 NOTE — ED Notes (Addendum)
Pt to the ER for pain in the right side of the back that shoots down the lateral side of right leg. Pt has a hx of back pain and received injections not long ago. Pt ambulatory with a slow gait and favoring the right side.

## 2019-07-27 NOTE — ED Triage Notes (Signed)
Patient ambulatory to triage with steady gait, without difficulty or distress noted, mask in place; pt reports 68mos ago slipped and fell; c/o persistent rt knee and lower back pain; st had MRI and was told she had "bruise on kneecap"

## 2019-07-27 NOTE — ED Provider Notes (Signed)
[facility] Emergency Department Provider Note  ____________________________________________  Time seen: Approximately 10:01 PM  I have reviewed the triage vital signs and the nursing notes.   HISTORY  Chief Complaint No chief complaint on file.    HPI Gail [name] is a 48 y.o. female presents to the emergency department with right-sided low back pain that radiates down the right lower extremity that is occurred for the past 2 to 3 weeks.  Patient denies recent falls or mechanisms of trauma.  She does state that she has had some mild dysuria over the past couple days.  She denies increased urinary frequency, suprapubic pain, nausea or vomiting.  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        Past Medical History:  Diagnosis Date  . Abdominal pain 10/08/2013   Last Assessment & Plan:  Abdominal pain seems multifactorial.  There is a component of pelvic floor myalgia on exam.  Her symptoms are also consistent with possible acute GI viral illness.  No evidence of acute emergent pathology to explain pain.  Advised supportive measures for GI symptoms and pain control with oral medications.  As noted in separate problem, renal stone may also be contributing.  Will follow up in 1-2 weeks to assess progress of symptoms.     . Abnormal uterine bleeding 03/24/2015   Last Assessment & Plan:  EMB pathology results still pending.  Plan to follow up with Dr Tressie Ellis as previously planned once results are available.  No bleeding concerns today.     . Anemia   . Calculus of kidney 01/25/2015   Last Assessment & Plan:  While her abdominal pain symptoms are not entirely consistent with the presence of her kidney stone, it is a possible contributor.  Rx provided for motrin and tamulosin to aid with passage of stone.     . Chronic pelvic pain in female 04/06/2015  . Complication of anesthesia    n/v  . 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 Episode of syncope 10/08/2013  . PONV (postoperative nausea and vomiting)     Patient Active Problem List   Diagnosis Date Noted  . Post-operative state 04/15/2015  . Menorrhagia 04/06/2015  . Chronic pelvic pain in female 04/06/2015  . 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 Dyspareunia 04/06/2015  . Abnormal uterine bleeding 03/24/2015  . Calculus of kidney 01/25/2015  . 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 Abdominal pain 10/08/2013  . Episode of syncope 10/08/2013  . Irregular bleeding 02/25/2013    Past Surgical History:  Procedure Laterality Date  . ABDOMINAL HYSTERECTOMY    . CESAREAN SECTION    . CYSTOSCOPY N/A 04/15/2015   Procedure: CYSTOSCOPY;  Surgeon: Boykin Nearing, MD;  Location: [facility] ORS;  Service: Gynecology;  Laterality: N/A;  . CYSTOSCOPY WITH RETROGRADE PYELOGRAM, URETEROSCOPY AND STENT PLACEMENT Left 12/28/2015   Procedure: CYSTOSCOPY WITH RETROGRADE PYELOGRAM, URETEROSCOPY AND STENT PLACEMENT;  Surgeon: Festus Aloe, MD;  Location: [facility] ORS;  Service: Urology;  Laterality: Left;  . CYSTOSCOPY WITH STENT PLACEMENT Left 01/18/2016   Procedure: CYSTOSCOPY WITH STENT REMOVAL;  Surgeon: Nickie Retort, MD;  Location: [facility] ORS;  Service: Urology;  Laterality: Left;  . HAND TENODESIS    . LAPAROSCOPIC ASSISTED VAGINAL HYSTERECTOMY N/A 04/15/2015   Procedure: LAPAROSCOPIC ASSISTED VAGINAL HYSTERECTOMY;  Surgeon: Boykin Nearing, MD;  Location: [facility] ORS;  Service: Gynecology;  Laterality: N/A;  .  LAPAROSCOPIC SALPINGO OOPHERECTOMY Bilateral 04/15/2015   Procedure: LAPAROSCOPIC SALPINGO OOPHORECTOMY;  Surgeon: Boykin Nearing, MD;  Location: [facility] ORS;  Service: Gynecology;  Laterality: Bilateral;  . URETEROSCOPY WITH HOLMIUM LASER LITHOTRIPSY Left 01/18/2016   Procedure: URETEROSCOPY WITH STONE EXTRACTION;  Surgeon: Nickie Retort, MD;  Location: [facility] ORS;  Service: Urology;  Laterality: Left;    Prior to Admission medications    Medication Sig Start Date End Date Taking? Authorizing Provider  acetaminophen (TYLENOL) 500 MG tablet Take 500 mg by mouth every 6 (six) hours as needed.    [provider]  baclofen (LIORESAL) 10 MG tablet Take 1 tablet (10 mg total) by mouth daily. 04/06/19 04/05/20  Fisher, Linden Dolin, PA-C  methocarbamol (ROBAXIN) 500 MG tablet Take 1 tablet (500 mg total) by mouth every 8 (eight) hours as needed for up to 5 days. 07/27/19 08/01/19  Lannie Fields, PA-C  predniSONE (STERAPRED UNI-PAK 21 TAB) 10 MG (21) TBPK tablet Take 6 tablets the first day.  Take 1 less pill daily until medication runs out. 07/27/19   Lannie Fields, PA-C  promethazine (PHENERGAN) 12.5 MG tablet Take 1 tablet (12.5 mg total) by mouth every 6 (six) hours as needed for nausea or vomiting. Patient not taking: Reported on 01/16/2016 12/28/15   Festus Aloe, MD    Allergies Ciprodex [ciprofloxacin-dexamethasone], Oxycodone, and Sulfamethoxazole-trimethoprim  No family history on file.  Social History Social History   Tobacco Use  . Smoking status: Never Smoker  . Smokeless tobacco: Never Used  Substance Use Topics  . Alcohol use: No  . Drug use: No     Review of Systems  Constitutional: No fever/chills Eyes: No visual changes. No discharge ENT: No upper respiratory complaints. Cardiovascular: no chest pain. Respiratory: no cough. No SOB. Gastrointestinal: No abdominal pain.  No nausea, no vomiting.  No diarrhea.  No constipation. Musculoskeletal: Patient has low back pain.  Skin: Negative for rash, abrasions, lacerations, ecchymosis. Neurological: Negative for headaches, focal weakness or numbness.   ____________________________________________   PHYSICAL EXAM:  VITAL SIGNS: ED Triage Vitals  Enc Vitals Group     BP 07/27/19 1959 136/65     Pulse Rate 07/27/19 1959 73     Resp 07/27/19 1959 18     Temp 07/27/19 1959 98.6 F (37 C)     Temp Source 07/27/19 1959 Oral     SpO2 07/27/19 1959  100 %     Weight 07/27/19 2000 109 lb (49.4 kg)     Height 07/27/19 2000 5\' 2"  (1.575 m)     Head Circumference --      Peak Flow --      Pain Score 07/27/19 1959 10     Pain Loc --      Pain Edu? --      Excl. in [place]? --      Constitutional: Alert and oriented. Well appearing and in no acute distress. Eyes: Conjunctivae are normal. PERRL. EOMI. Head: Atraumatic. Cardiovascular: Normal rate, regular rhythm. Normal S1 and S2.  Good peripheral circulation. Respiratory: Normal respiratory effort without tachypnea or retractions. Lungs CTAB. Good air entry to the bases with no decreased or absent breath sounds. Gastrointestinal: Bowel sounds 4 quadrants. Soft and nontender to palpation. No guarding or rigidity. No palpable masses. No distention. No CVA tenderness. Musculoskeletal: Full range of motion to all extremities. No gross deformities appreciated.  Patient has a positive straight leg raise test on the right. Neurologic:  Normal speech and language. No gross  focal neurologic deficits are appreciated.  Skin:  Skin is warm, dry and intact. No rash noted. Psychiatric: Mood and affect are normal. Speech and behavior are normal. Patient exhibits appropriate insight and judgement.   ____________________________________________   LABS (all labs ordered are listed, but only abnormal results are displayed)  Labs Reviewed  URINALYSIS, COMPLETE (UACMP) WITH MICROSCOPIC - Abnormal; Notable for the following components:      Result Value   Color, Urine YELLOW (*)    APPearance CLEAR (*)    Leukocytes,Ua SMALL (*)    All other components within normal limits   ____________________________________________  EKG   ____________________________________________  RADIOLOGY   No results found.  ____________________________________________    PROCEDURES  Procedure(s) performed:    Procedures    Medications  HYDROcodone-acetaminophen (NORCO/VICODIN) 5-325 MG per tablet 1  tablet (has no administration in time range)  ondansetron (ZOFRAN-ODT) disintegrating tablet 4 mg (has no administration in time range)     ____________________________________________   INITIAL IMPRESSION / ASSESSMENT AND PLAN / ED COURSE  Pertinent labs & imaging results that were available during my care of the patient were reviewed by me and considered in my medical decision making (see chart for details).  Review of the [place] CSRS was performed in accordance of the [place] prior to dispensing any controlled drugs.         Assessment and plan Low back pain 48 year old female presents to the emergency department with low back pain with right lower extremity radiculopathy that has been occurring for the past several weeks.  Patient has been seen and evaluated for radiculitis in the recent past and has had back injections.  Vital signs are reassuring at triage.  Patient has some paraspinal muscle tenderness along lumbar spine and a positive straight leg raise test on the right.  Differential diagnosis included lumbar radiculopathy, disc extrusion, sciatica and cystitis.  Urinalysis revealed small amount of leukocytes but no other abnormality.  Patient was discharged with tapered prednisone and Robaxin.  A work note was provided at her request.  All patient questions were answered.  ____________________________________________  FINAL CLINICAL IMPRESSION(S) / ED DIAGNOSES  Final diagnoses:  Sciatica of right side      NEW MEDICATIONS STARTED DURING THIS VISIT:  ED Discharge Orders         Ordered    predniSONE (STERAPRED UNI-PAK 21 TAB) 10 MG (21) TBPK tablet     07/27/19 2150    methocarbamol (ROBAXIN) 500 MG tablet  Every 8 hours PRN     07/27/19 2150              This chart was dictated using voice recognition software/Dragon. Despite best efforts to proofread, errors can occur which can change the meaning. Any change was purely unintentional.    Karren Cobble 07/27/19 2207    Duffy Bruce, MD 07/28/19 1030

## 2019-08-05 ENCOUNTER — Emergency Department
Admission: EM | Admit: 2019-08-05 | Discharge: 2019-08-05 | Disposition: A | Discharge status: Home / Self Care | Payer: Federal, State, Local not specified - PPO | Source: Home / Self Care | Attending: Emergency Medicine | Admitting: Emergency Medicine

## 2019-08-05 ENCOUNTER — Emergency Department: Payer: Federal, State, Local not specified - PPO

## 2019-08-05 ENCOUNTER — Other Ambulatory Visit: Payer: Self-pay

## 2019-08-05 DIAGNOSIS — K859 Acute pancreatitis without necrosis or infection, unspecified: Secondary | ICD-10-CM

## 2019-08-05 DIAGNOSIS — Z885 Allergy status to narcotic agent status: Secondary | ICD-10-CM

## 2019-08-05 DIAGNOSIS — R030 Elevated blood-pressure reading, without diagnosis of hypertension: Secondary | ICD-10-CM | POA: Diagnosis present

## 2019-08-05 DIAGNOSIS — R52 Pain, unspecified: Secondary | ICD-10-CM | POA: Diagnosis not present

## 2019-08-05 DIAGNOSIS — K8064 Calculus of gallbladder and bile duct with chronic cholecystitis without obstruction: Secondary | ICD-10-CM | POA: Diagnosis present

## 2019-08-05 DIAGNOSIS — K851 Biliary acute pancreatitis without necrosis or infection: Principal | ICD-10-CM | POA: Diagnosis present

## 2019-08-05 DIAGNOSIS — R1013 Epigastric pain: Secondary | ICD-10-CM

## 2019-08-05 DIAGNOSIS — Z87442 Personal history of urinary calculi: Secondary | ICD-10-CM

## 2019-08-05 DIAGNOSIS — Z888 Allergy status to other drugs, medicaments and biological substances status: Secondary | ICD-10-CM

## 2019-08-05 DIAGNOSIS — Z79899 Other long term (current) drug therapy: Secondary | ICD-10-CM | POA: Insufficient documentation

## 2019-08-05 DIAGNOSIS — Z8543 Personal history of malignant neoplasm of ovary: Secondary | ICD-10-CM | POA: Insufficient documentation

## 2019-08-05 DIAGNOSIS — Z882 Allergy status to sulfonamides status: Secondary | ICD-10-CM

## 2019-08-05 DIAGNOSIS — Z01812 Encounter for preprocedural laboratory examination: Secondary | ICD-10-CM

## 2019-08-05 DIAGNOSIS — Z20828 Contact with and (suspected) exposure to other viral communicable diseases: Secondary | ICD-10-CM | POA: Diagnosis present

## 2019-08-05 LAB — COMPREHENSIVE METABOLIC PANEL
ALT: 24 U/L (ref 0–44)
AST: 14 U/L — ABNORMAL LOW (ref 15–41)
Albumin: 3.6 g/dL (ref 3.5–5.0)
Alkaline Phosphatase: 61 U/L (ref 38–126)
Anion gap: 7 (ref 5–15)
BUN: 18 mg/dL (ref 6–20)
CO2: 27 mmol/L (ref 22–32)
Calcium: 8.9 mg/dL (ref 8.9–10.3)
Chloride: 108 mmol/L (ref 98–111)
Creatinine, Ser: 0.79 mg/dL (ref 0.44–1.00)
GFR calc Af Amer: >60 mL/min (ref >60)
GFR calc non Af Amer: >60 mL/min (ref >60)
Glucose, Bld: 88 mg/dL (ref 70–99)
Potassium: 3.5 mmol/L (ref 3.5–5.1)
Sodium: 142 mmol/L (ref 135–145)
Total Bilirubin: 0.7 mg/dL (ref 0.3–1.2)
Total Protein: 6.5 g/dL (ref 6.5–8.1)

## 2019-08-05 LAB — CBC
HCT: 41.4 % (ref 36.0–46.0)
Hemoglobin: 13.4 g/dL (ref 12.0–15.0)
MCH: 28.5 pg (ref 26.0–34.0)
MCHC: 32.4 g/dL (ref 30.0–36.0)
MCV: 88.1 fL (ref 80.0–100.0)
Platelets: 253 10*3/uL (ref 150–400)
RBC: 4.7 MIL/uL (ref 3.87–5.11)
RDW: 13 % (ref 11.5–15.5)
WBC: 11.7 10*3/uL — ABNORMAL HIGH (ref 4.0–10.5)
nRBC: 0 % (ref 0.0–0.2)

## 2019-08-05 LAB — URINALYSIS, COMPLETE (UACMP) WITH MICROSCOPIC
Bilirubin Urine: NEGATIVE
Glucose, UA: NEGATIVE mg/dL
Hgb urine dipstick: NEGATIVE
Ketones, ur: NEGATIVE mg/dL
Nitrite: NEGATIVE
Protein, ur: NEGATIVE mg/dL
Specific Gravity, Urine: 1.013 (ref 1.005–1.030)
pH: 6 (ref 5.0–8.0)

## 2019-08-05 LAB — LIPASE, BLOOD: Lipase: 366 U/L — ABNORMAL HIGH (ref 11–51)

## 2019-08-05 MED ORDER — FENTANYL CITRATE (PF) 100 MCG/2ML IJ SOLN
50.0000 ug | Freq: Once | INTRAMUSCULAR | Status: AC
  Administered 2019-08-05: 50 ug via INTRAVENOUS
  Filled 2019-08-05: qty 2

## 2019-08-05 MED ORDER — HYDROCODONE-ACETAMINOPHEN 5-325 MG PO TABS: 1.0000 | ORAL_TABLET | Freq: Four times a day (QID) | ORAL | 0 refills | Status: DC | PRN

## 2019-08-05 MED ORDER — MORPHINE SULFATE (PF) 4 MG/ML IV SOLN
4.0000 mg | Freq: Once | INTRAVENOUS | Status: AC
  Administered 2019-08-05: 18:00:00 4 mg via INTRAVENOUS
  Filled 2019-08-05: qty 1

## 2019-08-05 MED ORDER — HYDROMORPHONE HCL 1 MG/ML IJ SOLN
1.0000 mg | Freq: Once | INTRAMUSCULAR | Status: AC
  Administered 2019-08-05: 18:00:00 1 mg via INTRAVENOUS
  Filled 2019-08-05: qty 1

## 2019-08-05 MED ORDER — ONDANSETRON HCL 4 MG PO TABS: 4.0000 mg | ORAL_TABLET | Freq: Three times a day (TID) | ORAL | 0 refills | Status: DC | PRN

## 2019-08-05 MED ORDER — SODIUM CHLORIDE 0.9 % IV BOLUS
1000.0000 mL | Freq: Once | INTRAVENOUS | Status: AC
  Administered 2019-08-05: 1000 mL via INTRAVENOUS

## 2019-08-05 MED ORDER — OXYCODONE-ACETAMINOPHEN 5-325 MG PO TABS: 1.0000 | ORAL_TABLET | Freq: Four times a day (QID) | ORAL | 0 refills | Status: DC | PRN

## 2019-08-05 NOTE — ED Triage Notes (Signed)
C/o lower back pain that radiates bilaterally to lower abdomen since June after an injury trip and fall. Denies NVD. Endorses generalized fatigue and difficulty walking due to pain. Pt alert and oriented X4, cooperative, RR even and unlabored, color WNL. Pt in NAD.

## 2019-08-05 NOTE — Discharge Instructions (Addendum)
Please follow a clear liquid diet for the first 24 hours and then you can start adding back solid food. Please return for any worsening pain, persistent vomiting, bloody vomit or stool or fevers.

## 2019-08-05 NOTE — ED Notes (Signed)
Dr. Goodman at bedside.  

## 2019-08-05 NOTE — ED Notes (Signed)
Pt up to use bathroom at this time. 

## 2019-08-05 NOTE — ED Notes (Signed)
Pt up to use bathroom 

## 2019-08-05 NOTE — ED Provider Notes (Signed)
[facility] Emergency Department Provider Note   ____________________________________________   I have reviewed the triage vital signs and the nursing notes.   HISTORY  Chief Complaint Back Pain and Abdominal Pain   History limited by: Not Limited   HPI Gail [name] is a 48 y.o. female who presents to the emergency department today because of concern for abdominal pain. Located in the epigastric region. Patient states pain started last night. Has been constant since then. Some radiation to her back. The patient denies any associated vomiting or fevers. Denies similar pain in the past. Was able to eat some crackers and drink some water today. In addition the patient has complaints of low back pain since an injury, states that she is being seen for this as an outpatient.   Records reviewed. Per medical record review patient has a history of MRI performed a little over one month ago of the lumbar spine. Did show some disc bulging and stenosis. Did follow up with PM and R. Received transforaminal epidural injections roughly 2 weeks ago.  Past Medical History:  Diagnosis Date  . Abdominal pain 10/08/2013   Last Assessment & Plan:  Abdominal pain seems multifactorial.  There is a component of pelvic floor myalgia on exam.  Her symptoms are also consistent with possible acute GI viral illness.  No evidence of acute emergent pathology to explain pain.  Advised supportive measures for GI symptoms and pain control with oral medications.  As noted in separate problem, renal stone may also be contributing.  Will follow up in 1-2 weeks to assess progress of symptoms.     . Abnormal uterine bleeding 03/24/2015   Last Assessment & Plan:  EMB pathology results still pending.  Plan to follow up with Dr Tressie Ellis as previously planned once results are available.  No bleeding concerns today.     . Anemia   . Calculus of kidney 01/25/2015   Last Assessment & Plan:  While her  abdominal pain symptoms are not entirely consistent with the presence of her kidney stone, it is a possible contributor.  Rx provided for motrin and tamulosin to aid with passage of stone.     . Chronic pelvic pain in female 04/06/2015  . Complication of anesthesia    n/v  . 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 Episode of syncope 10/08/2013  . PONV (postoperative nausea and vomiting)     Patient Active Problem List   Diagnosis Date Noted  . Post-operative state 04/15/2015  . Menorrhagia 04/06/2015  . Chronic pelvic pain in female 04/06/2015  . 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 Dyspareunia 04/06/2015  . Abnormal uterine bleeding 03/24/2015  . Calculus of kidney 01/25/2015  . 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 Abdominal pain 10/08/2013  . Episode of syncope 10/08/2013  . Irregular bleeding 02/25/2013    Past Surgical History:  Procedure Laterality Date  . ABDOMINAL HYSTERECTOMY    . CESAREAN SECTION    . CYSTOSCOPY N/A 04/15/2015   Procedure: CYSTOSCOPY;  Surgeon: Boykin Nearing, MD;  Location: [facility] ORS;  Service: Gynecology;  Laterality: N/A;  . CYSTOSCOPY WITH RETROGRADE PYELOGRAM, URETEROSCOPY AND STENT PLACEMENT Left 12/28/2015   Procedure: CYSTOSCOPY WITH RETROGRADE PYELOGRAM, URETEROSCOPY AND STENT PLACEMENT;  Surgeon: Festus Aloe, MD;  Location: [facility] ORS;  Service: Urology;  Laterality: Left;  . CYSTOSCOPY WITH STENT PLACEMENT Left 01/18/2016   Procedure: CYSTOSCOPY WITH STENT REMOVAL;  Surgeon: Nickie Retort, MD;  Location: [facility]  ORS;  Service: Urology;  Laterality: Left;  . HAND TENODESIS    . LAPAROSCOPIC ASSISTED VAGINAL HYSTERECTOMY N/A 04/15/2015   Procedure: LAPAROSCOPIC ASSISTED VAGINAL HYSTERECTOMY;  Surgeon: Boykin Nearing, MD;  Location: [facility] ORS;  Service: Gynecology;  Laterality: N/A;  . LAPAROSCOPIC SALPINGO OOPHERECTOMY Bilateral 04/15/2015   Procedure: LAPAROSCOPIC SALPINGO OOPHORECTOMY;  Surgeon: Boykin Nearing, MD;  Location:  [facility] ORS;  Service: Gynecology;  Laterality: Bilateral;  . URETEROSCOPY WITH HOLMIUM LASER LITHOTRIPSY Left 01/18/2016   Procedure: URETEROSCOPY WITH STONE EXTRACTION;  Surgeon: Nickie Retort, MD;  Location: [facility] ORS;  Service: Urology;  Laterality: Left;    Prior to Admission medications   Medication Sig Start Date End Date Taking? Authorizing Provider  acetaminophen (TYLENOL) 500 MG tablet Take 500 mg by mouth every 6 (six) hours as needed.    [provider]  baclofen (LIORESAL) 10 MG tablet Take 1 tablet (10 mg total) by mouth daily. 04/06/19 04/05/20  Fisher, Linden Dolin, PA-C  predniSONE (STERAPRED UNI-PAK 21 TAB) 10 MG (21) TBPK tablet Take 6 tablets the first day.  Take 1 less pill daily until medication runs out. 07/27/19   Lannie Fields, PA-C  promethazine (PHENERGAN) 12.5 MG tablet Take 1 tablet (12.5 mg total) by mouth every 6 (six) hours as needed for nausea or vomiting. Patient not taking: Reported on 01/16/2016 12/28/15   Festus Aloe, MD    Allergies Ciprodex [ciprofloxacin-dexamethasone], Oxycodone, and Sulfamethoxazole-trimethoprim  No family history on file.  Social History Social History   Tobacco Use  . Smoking status: Never Smoker  . Smokeless tobacco: Never Used  Substance Use Topics  . Alcohol use: No  . Drug use: No    Review of Systems Constitutional: No fever/chills Eyes: No visual changes. ENT: No sore throat. Cardiovascular: Denies chest pain. Respiratory: Denies shortness of breath. Gastrointestinal: Positive for epigastric abdominal pain.  Genitourinary: Negative for dysuria. Musculoskeletal: Positive for upper and lower back pain. Positive for pain radiating to her legs.  Skin: Negative for rash. Neurological: Negative for headaches, focal weakness or numbness.  ____________________________________________   PHYSICAL EXAM:  VITAL SIGNS: ED Triage Vitals  Enc Vitals Group     BP 08/05/19 1240 (!) 159/75     Pulse Rate 08/05/19  1240 84     Resp --      Temp 08/05/19 1240 98 F (36.7 C)     Temp Source 08/05/19 1240 Oral     SpO2 08/05/19 1240 99 %     Weight 08/05/19 1243 108 lb 0.4 oz (49 kg)     Height 08/05/19 1243 5\' 2"  (1.575 m)     Head Circumference --      Peak Flow --      Pain Score 08/05/19 1242 10   Constitutional: Alert and oriented.  Eyes: Conjunctivae are normal.  ENT      Head: Normocephalic and atraumatic.      Nose: No congestion/rhinnorhea.      Mouth/Throat: Mucous membranes are moist.      Neck: No stridor. Hematological/Lymphatic/Immunilogical: No cervical lymphadenopathy. Cardiovascular: Normal rate, regular rhythm.  No murmurs, rubs, or gallops.  Respiratory: Normal respiratory effort without tachypnea nor retractions. Breath sounds are clear and equal bilaterally. No wheezes/rales/rhonchi. Gastrointestinal: Soft and minimally tender in the epigastric region. Genitourinary: Deferred Musculoskeletal: Normal range of motion in all extremities. No lower extremity edema. Neurologic:  Normal speech and language. No gross focal neurologic deficits are appreciated.  Skin:  Skin is warm, dry and intact.  No rash noted. Psychiatric: Mood and affect are normal. Speech and behavior are normal. Patient exhibits appropriate insight and judgment.  ____________________________________________    LABS (pertinent positives/negatives)  Lipase 366 CMP wnl except ast 14 CBC wbc 11.7, hgb 13.4, plt 253  ____________________________________________   EKG  None  ____________________________________________    RADIOLOGY  None  ____________________________________________   PROCEDURES  Procedures  ____________________________________________   INITIAL IMPRESSION / ASSESSMENT AND PLAN / ED COURSE  Pertinent labs & imaging results that were available during my care of the patient were reviewed by me and considered in my medical decision making (see chart for details).   Patient  presented to the emergency department today because of concerns primarily for epigastric pain.  Patient does have a history of chronic low back pain and is being worked up and seen PM and R for this.  Terms of the epigastric pain patient's work-up is significant for elevated lipase.  Per chart review I do not see that she has had elevated lipase in the past.  Discussed this finding with the patient.  I do think this could explain the patient's epigastric pain.  Ultrasound was performed which did show gallstones although no dilation of the common bile duct.  Additionally patient's liver function tests are essentially normal.  This point I doubt significant obstruction.  Patient did feel better after IV fluids and pain medications.  She was able to tolerate oral fluids.  I discussed with the patient outpatient treatment.  I did feel comfortable at this time trying outpatient treatment.  Will give patient prescription for pain medication and nausea medication.  Describe diet with the patient.  Did however discuss strict return precautions and hospitalization for worsening pancreatitis.    ____________________________________________   FINAL CLINICAL IMPRESSION(S) / ED DIAGNOSES  Final diagnoses:  Epigastric pain  Acute pancreatitis, unspecified complication status, unspecified pancreatitis type     Note: This dictation was prepared with Dragon dictation. Any transcriptional errors that result from this process are unintentional     Nance Pear, MD 08/05/19 2003

## 2019-08-05 NOTE — ED Notes (Signed)
Patient transported to Ultrasound 

## 2019-08-05 NOTE — ED Triage Notes (Signed)
Pt back to the ER for admission. Pt was just seen a few hours ago by Dr Archie Balboa who recommended admission. Pt declined. Pt says the pain is worse and is unable to tolerate at home.

## 2019-08-06 ENCOUNTER — Inpatient Hospital Stay: Payer: Federal, State, Local not specified - PPO

## 2019-08-06 ENCOUNTER — Inpatient Hospital Stay
Admission: EM | Admit: 2019-08-06 | Discharge: 2019-08-08 | DRG: 418 | Disposition: A | Discharge status: Home / Self Care | Payer: Federal, State, Local not specified - PPO | Attending: Internal Medicine | Admitting: Internal Medicine

## 2019-08-06 DIAGNOSIS — K859 Acute pancreatitis without necrosis or infection, unspecified: Secondary | ICD-10-CM | POA: Diagnosis present

## 2019-08-06 DIAGNOSIS — R1011 Right upper quadrant pain: Secondary | ICD-10-CM

## 2019-08-06 DIAGNOSIS — K8064 Calculus of gallbladder and bile duct with chronic cholecystitis without obstruction: Secondary | ICD-10-CM | POA: Diagnosis present

## 2019-08-06 DIAGNOSIS — R52 Pain, unspecified: Secondary | ICD-10-CM

## 2019-08-06 DIAGNOSIS — Z79899 Other long term (current) drug therapy: Secondary | ICD-10-CM | POA: Diagnosis not present

## 2019-08-06 DIAGNOSIS — Z87442 Personal history of urinary calculi: Secondary | ICD-10-CM | POA: Diagnosis not present

## 2019-08-06 DIAGNOSIS — Z888 Allergy status to other drugs, medicaments and biological substances status: Secondary | ICD-10-CM | POA: Diagnosis not present

## 2019-08-06 DIAGNOSIS — R030 Elevated blood-pressure reading, without diagnosis of hypertension: Secondary | ICD-10-CM | POA: Diagnosis present

## 2019-08-06 DIAGNOSIS — K851 Biliary acute pancreatitis without necrosis or infection: Secondary | ICD-10-CM | POA: Diagnosis present

## 2019-08-06 DIAGNOSIS — Z20828 Contact with and (suspected) exposure to other viral communicable diseases: Secondary | ICD-10-CM | POA: Diagnosis present

## 2019-08-06 DIAGNOSIS — Z882 Allergy status to sulfonamides status: Secondary | ICD-10-CM | POA: Diagnosis not present

## 2019-08-06 DIAGNOSIS — Z885 Allergy status to narcotic agent status: Secondary | ICD-10-CM | POA: Diagnosis not present

## 2019-08-06 DIAGNOSIS — Z01812 Encounter for preprocedural laboratory examination: Secondary | ICD-10-CM | POA: Diagnosis not present

## 2019-08-06 LAB — CBC WITH DIFFERENTIAL/PLATELET
Abs Immature Granulocytes: 0.09 10*3/uL — ABNORMAL HIGH (ref 0.00–0.07)
Basophils Absolute: 0 10*3/uL (ref 0.0–0.1)
Basophils Relative: 0 %
Eosinophils Absolute: 0.1 10*3/uL (ref 0.0–0.5)
Eosinophils Relative: 1 %
HCT: 41.2 % (ref 36.0–46.0)
Hemoglobin: 13.5 g/dL (ref 12.0–15.0)
Immature Granulocytes: 1 %
Lymphocytes Relative: 10 %
Lymphs Abs: 1.3 10*3/uL (ref 0.7–4.0)
MCH: 28.5 pg (ref 26.0–34.0)
MCHC: 32.8 g/dL (ref 30.0–36.0)
MCV: 86.9 fL (ref 80.0–100.0)
Monocytes Absolute: 0.9 10*3/uL (ref 0.1–1.0)
Monocytes Relative: 7 %
Neutro Abs: 10.1 10*3/uL — ABNORMAL HIGH (ref 1.7–7.7)
Neutrophils Relative %: 81 %
Platelets: 244 10*3/uL (ref 150–400)
RBC: 4.74 MIL/uL (ref 3.87–5.11)
RDW: 12.8 % (ref 11.5–15.5)
WBC: 12.5 10*3/uL — ABNORMAL HIGH (ref 4.0–10.5)
nRBC: 0 % (ref 0.0–0.2)

## 2019-08-06 LAB — COMPREHENSIVE METABOLIC PANEL
ALT: 33 U/L (ref 0–44)
AST: 32 U/L (ref 15–41)
Albumin: 4 g/dL (ref 3.5–5.0)
Alkaline Phosphatase: 78 U/L (ref 38–126)
Anion gap: 7 (ref 5–15)
BUN: 13 mg/dL (ref 6–20)
CO2: 28 mmol/L (ref 22–32)
Calcium: 8.9 mg/dL (ref 8.9–10.3)
Chloride: 103 mmol/L (ref 98–111)
Creatinine, Ser: 0.6 mg/dL (ref 0.44–1.00)
GFR calc Af Amer: >60 mL/min (ref >60)
GFR calc non Af Amer: >60 mL/min (ref >60)
Glucose, Bld: 118 mg/dL — ABNORMAL HIGH (ref 70–99)
Potassium: 4 mmol/L (ref 3.5–5.1)
Sodium: 138 mmol/L (ref 135–145)
Total Bilirubin: 0.9 mg/dL (ref 0.3–1.2)
Total Protein: 6.9 g/dL (ref 6.5–8.1)

## 2019-08-06 LAB — HEMOGLOBIN A1C
Hgb A1c MFr Bld: 5.9 % — ABNORMAL HIGH (ref 4.8–5.6)
Mean Plasma Glucose: 122.63 mg/dL

## 2019-08-06 LAB — LIPASE, BLOOD: Lipase: 102 U/L — ABNORMAL HIGH (ref 11–51)

## 2019-08-06 LAB — SARS CORONAVIRUS 2 BY RT PCR (HOSPITAL ORDER, PERFORMED IN ~~LOC~~ HOSPITAL LAB): SARS Coronavirus 2: NEGATIVE

## 2019-08-06 LAB — TSH: TSH: 0.801 u[IU]/mL (ref 0.350–4.500)

## 2019-08-06 LAB — TRIGLYCERIDES: Triglycerides: 72 mg/dL (ref <150)

## 2019-08-06 MED ORDER — MORPHINE SULFATE (PF) 4 MG/ML IV SOLN
4.0000 mg | Freq: Once | INTRAVENOUS | Status: AC
  Administered 2019-08-06: 4 mg via INTRAVENOUS
  Filled 2019-08-06: qty 1

## 2019-08-06 MED ORDER — PANTOPRAZOLE SODIUM 40 MG IV SOLR
40.0000 mg | Freq: Two times a day (BID) | INTRAVENOUS | Status: DC
  Administered 2019-08-06 – 2019-08-08 (×5): 40 mg via INTRAVENOUS
  Filled 2019-08-06 (×5): qty 40

## 2019-08-06 MED ORDER — MORPHINE SULFATE (PF) 2 MG/ML IV SOLN
2.0000 mg | INTRAVENOUS | Status: DC | PRN
  Administered 2019-08-06 (×2): 2 mg via INTRAVENOUS
  Filled 2019-08-06 (×2): qty 1

## 2019-08-06 MED ORDER — SODIUM CHLORIDE 0.9 % IV SOLN
Freq: Once | INTRAVENOUS | Status: AC
  Administered 2019-08-06: 02:00:00 via INTRAVENOUS

## 2019-08-06 MED ORDER — HYDROCODONE-ACETAMINOPHEN 7.5-325 MG PO TABS
1.0000 | ORAL_TABLET | Freq: Four times a day (QID) | ORAL | Status: DC | PRN
  Administered 2019-08-07 – 2019-08-08 (×3): 1 via ORAL
  Filled 2019-08-06 (×4): qty 1

## 2019-08-06 MED ORDER — MORPHINE SULFATE (PF) 2 MG/ML IV SOLN
2.0000 mg | INTRAVENOUS | Status: DC | PRN
  Administered 2019-08-07: 23:00:00 2 mg via INTRAVENOUS
  Filled 2019-08-06: qty 1

## 2019-08-06 MED ORDER — GADOBUTROL 1 MMOL/ML IV SOLN
5.0000 mL | Freq: Once | INTRAVENOUS | Status: AC | PRN
  Administered 2019-08-06: 5 mL via INTRAVENOUS

## 2019-08-06 MED ORDER — HYDROMORPHONE HCL 1 MG/ML IJ SOLN: 0.5000 mg | INTRAMUSCULAR | Status: DC | PRN

## 2019-08-06 MED ORDER — ONDANSETRON HCL 4 MG/2ML IJ SOLN
4.0000 mg | Freq: Four times a day (QID) | INTRAMUSCULAR | Status: DC | PRN
  Administered 2019-08-06: 4 mg via INTRAVENOUS
  Filled 2019-08-06: qty 2

## 2019-08-06 MED ORDER — SODIUM CHLORIDE 0.9 % IV SOLN
INTRAVENOUS | Status: DC
  Administered 2019-08-06 – 2019-08-08 (×4): via INTRAVENOUS

## 2019-08-06 MED ORDER — SODIUM CHLORIDE 0.9 % IV BOLUS
1000.0000 mL | Freq: Once | INTRAVENOUS | Status: AC
  Administered 2019-08-06: 01:00:00 1000 mL via INTRAVENOUS

## 2019-08-06 MED ORDER — ONDANSETRON HCL 4 MG PO TABS: 4.0000 mg | ORAL_TABLET | Freq: Four times a day (QID) | ORAL | Status: DC | PRN

## 2019-08-06 MED ORDER — ONDANSETRON HCL 4 MG/2ML IJ SOLN
4.0000 mg | INTRAMUSCULAR | Status: AC
  Administered 2019-08-06: 4 mg via INTRAVENOUS
  Filled 2019-08-06: qty 2

## 2019-08-06 NOTE — Consult Note (Signed)
SURGICAL CONSULTATION NOTE   HISTORY OF PRESENT ILLNESS (HPI):  48 y.o. female presented to [facility] ED for evaluation of abdominal pain since 2 days ago. Patient reports pain localized to the epigastric area.  Pain radiated to her back.  Patient reports associated nausea and vomiting.  There was no alleviating or aggravating factor.  Pain has been improving by itself in the last few days.  She came to the ED and she was evaluated with labs and images.  Labs shows elevated lipase and white blood cell count.  Abdominal ultrasound showed a gallbladder full of stones.  MRCP was done without finding of choledocholithiasis.  I personally evaluated the images.  Lipase is decreasing.  Surgery is consulted by Dr. Leslye Peer in this context for evaluation and management of cholelithiasis.  PAST MEDICAL HISTORY (PMH):  Past Medical History:  Diagnosis Date  . Abdominal pain 10/08/2013   Last Assessment & Plan:  Abdominal pain seems multifactorial.  There is a component of pelvic floor myalgia on exam.  Her symptoms are also consistent with possible acute GI viral illness.  No evidence of acute emergent pathology to explain pain.  Advised supportive measures for GI symptoms and pain control with oral medications.  As noted in separate problem, renal stone may also be contributing.  Will follow up in 1-2 weeks to assess progress of symptoms.     . Abnormal uterine bleeding 03/24/2015   Last Assessment & Plan:  EMB pathology results still pending.  Plan to follow up with Dr Tressie Ellis as previously planned once results are available.  No bleeding concerns today.     . Anemia   . Calculus of kidney 01/25/2015   Last Assessment & Plan:  While her abdominal pain symptoms are not entirely consistent with the presence of her kidney stone, it is a possible contributor.  Rx provided for motrin and tamulosin to aid with passage of stone.     . Chronic pelvic pain in female 04/06/2015  . Complication of anesthesia    n/v  .  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 Episode of syncope 10/08/2013  . PONV (postoperative nausea and vomiting)      PAST SURGICAL HISTORY ([place]):  Past Surgical History:  Procedure Laterality Date  . ABDOMINAL HYSTERECTOMY    . CESAREAN SECTION    . CYSTOSCOPY N/A 04/15/2015   Procedure: CYSTOSCOPY;  Surgeon: Boykin Nearing, MD;  Location: [facility] ORS;  Service: Gynecology;  Laterality: N/A;  . CYSTOSCOPY WITH RETROGRADE PYELOGRAM, URETEROSCOPY AND STENT PLACEMENT Left 12/28/2015   Procedure: CYSTOSCOPY WITH RETROGRADE PYELOGRAM, URETEROSCOPY AND STENT PLACEMENT;  Surgeon: Festus Aloe, MD;  Location: [facility] ORS;  Service: Urology;  Laterality: Left;  . CYSTOSCOPY WITH STENT PLACEMENT Left 01/18/2016   Procedure: CYSTOSCOPY WITH STENT REMOVAL;  Surgeon: Nickie Retort, MD;  Location: [facility] ORS;  Service: Urology;  Laterality: Left;  . HAND TENODESIS    . LAPAROSCOPIC ASSISTED VAGINAL HYSTERECTOMY N/A 04/15/2015   Procedure: LAPAROSCOPIC ASSISTED VAGINAL HYSTERECTOMY;  Surgeon: Boykin Nearing, MD;  Location: [facility] ORS;  Service: Gynecology;  Laterality: N/A;  . LAPAROSCOPIC SALPINGO OOPHERECTOMY Bilateral 04/15/2015   Procedure: LAPAROSCOPIC SALPINGO OOPHORECTOMY;  Surgeon: Boykin Nearing, MD;  Location: [facility] ORS;  Service: Gynecology;  Laterality: Bilateral;  . URETEROSCOPY WITH HOLMIUM LASER LITHOTRIPSY Left 01/18/2016   Procedure: URETEROSCOPY WITH STONE EXTRACTION;  Surgeon: Nickie Retort, MD;  Location: [facility] ORS;  Service: Urology;  Laterality: Left;     MEDICATIONS:  Prior to Admission medications   Medication Sig Start Date End Date Taking? Authorizing Provider  acetaminophen (TYLENOL) 500 MG tablet Take 500 mg by mouth every 6 (six) hours as needed.   Yes [provider]  meloxicam (MOBIC) 15 MG tablet Take 1 tablet by mouth daily. 07/13/19  Yes [provider]  benzonatate (TESSALON) 200 MG capsule Take 200 mg by mouth 3 (three) times  daily as needed.  08/03/19   [provider]  HYDROcodone-acetaminophen (NORCO) 5-325 MG tablet Take 1 tablet by mouth every 6 (six) hours as needed for moderate pain. 08/05/19   Nance Pear, MD  ondansetron (ZOFRAN) 4 MG tablet Take 1 tablet (4 mg total) by mouth every 8 (eight) hours as needed. 08/05/19   Nance Pear, MD     ALLERGIES:  Allergies  Allergen Reactions  . Ciprodex [Ciprofloxacin-Dexamethasone] Itching  . Oxycodone Nausea And Vomiting  . Sulfamethoxazole-Trimethoprim Nausea And Vomiting and Rash     SOCIAL HISTORY:  Social History   Socioeconomic History  . Marital status: Married    Spouse name: Not on file  . Number of children: Not on file  . Years of education: Not on file  . Highest education level: Not on file  Occupational History  . Not on file  Social Needs  . Financial resource strain: Not on file  . Food insecurity    Worry: Not on file    Inability: Not on file  . Transportation needs    Medical: Not on file    Non-medical: Not on file  Tobacco Use  . Smoking status: Never Smoker  . Smokeless tobacco: Never Used  Substance and Sexual Activity  . Alcohol use: No  . Drug use: No  . Sexual activity: Never    Birth control/protection: None  Lifestyle  . Physical activity    Days per week: Not on file    Minutes per session: Not on file  . Stress: Not on file  Relationships  . Social Herbalist on phone: Not on file    Gets together: Not on file    Attends religious service: Not on file    Active member of club or organization: Not on file    Attends meetings of clubs or organizations: Not on file    Relationship status: Not on file  . Intimate partner violence    Fear of current or ex partner: Not on file    Emotionally abused: Not on file    Physically abused: Not on file    Forced sexual activity: Not on file  Other Topics Concern  . Not on file  Social History Narrative  . Not on file    The patient  currently resides (home / rehab facility / nursing home): Home The patient normally is (ambulatory / bedbound): Ambulatory   FAMILY HISTORY:  No family history on file.   REVIEW OF SYSTEMS:  Constitutional: denies weight loss, fever, chills, or sweats  Eyes: denies any other vision changes, history of eye injury  ENT: denies sore throat, hearing problems  Respiratory: denies shortness of breath, wheezing  Cardiovascular: denies chest pain, palpitations  Gastrointestinal: positive abdominal pain, positive nausea and vomitnig Genitourinary: denies burning with urination or urinary frequency Musculoskeletal: denies any other joint pains or cramps  Skin: denies any other rashes or skin discolorations  Neurological: denies any other headache, dizziness, weakness  Psychiatric: denies any other depression, anxiety   All other review of systems were negative  VITAL SIGNS:  Temp:  [97.9 F (36.6 C)-99.7 F (37.6 C)] 99.7 F (37.6 C) (09/03 0440) Pulse Rate:  [60-74] 64 (09/03 0440) Resp:  [16-18] 16 (09/03 0440) BP: (124-160)/(58-75) 153/67 (09/03 0440) SpO2:  [97 %-100 %] 100 % (09/03 0440) Weight:  [45.4 kg-52.7 kg] 52.7 kg (09/03 0446)     Height: 5\' 2"  (157.5 cm) Weight: 52.7 kg BMI (Calculated): 21.24   INTAKE/OUTPUT:  This shift: Total I/O In: -  Out: 500 [Urine:500]  Last 2 shifts: @IOLAST2SHIFTS @   PHYSICAL EXAM:  Constitutional:  -- Normal body habitus  -- Awake, alert, and oriented x3  Eyes:  -- Pupils equally round and reactive to light  -- No scleral icterus  Ear, nose, and throat:  -- No jugular venous distension  Pulmonary:  -- No crackles  -- Equal breath sounds bilaterally -- Breathing non-labored at rest Cardiovascular:  -- S1, S2 present  -- No pericardial rubs Gastrointestinal:  -- Abdomen soft, nontender, non-distended, no guarding or rebound tenderness -- No abdominal masses appreciated, pulsatile or otherwise  Musculoskeletal and Integumentary:   -- Wounds or skin discoloration: None appreciated -- Extremities: B/L UE and LE FROM, hands and feet warm, no edema  Neurologic:  -- Motor function: intact and symmetric -- Sensation: intact and symmetric   Labs:  CBC Latest Ref Rng & Units 08/06/2019 08/05/2019 03/03/2019  WBC 4.0 - 10.5 K/uL 12.5(H) 11.7(H) 4.9  Hemoglobin 12.0 - 15.0 g/dL 13.5 13.4 13.1  Hematocrit 36.0 - 46.0 % 41.2 41.4 40.4  Platelets 150 - 400 K/uL 244 253 232   CMP Latest Ref Rng & Units 08/06/2019 08/05/2019 03/03/2019  Glucose 70 - 99 mg/dL 118(H) 88 131(H)  BUN 6 - 20 mg/dL 13 18 17   Creatinine 0.44 - 1.00 mg/dL 0.60 0.79 0.61  Sodium 135 - 145 mmol/L 138 142 141  Potassium 3.5 - 5.1 mmol/L 4.0 3.5 3.5  Chloride 98 - 111 mmol/L 103 108 106  CO2 22 - 32 mmol/L 28 27 27   Calcium 8.9 - 10.3 mg/dL 8.9 8.9 9.2  Total Protein 6.5 - 8.1 g/dL 6.9 6.5 7.1  Total Bilirubin 0.3 - 1.2 mg/dL 0.9 0.7 0.6  Alkaline Phos 38 - 126 U/L 78 61 74  AST 15 - 41 U/L 32 14(L) 28  ALT 0 - 44 U/L 33 24 32   Lipase     Component Value Date/Time   LIPASE 102 (H) 08/06/2019 0127     Imaging studies:  EXAM: ULTRASOUND ABDOMEN LIMITED RIGHT UPPER QUADRANT  COMPARISON:  Renal ultrasound dated 12/28/2015 and CT of the abdomen pelvis dated 01/11/2016  FINDINGS: Gallbladder:  The gallbladder is filled with stones. No gallbladder wall thickening or pericholecystic fluid. Negative sonographic Murphy's sign.  Common bile duct:  Diameter: 3 mm  Liver:  The liver is unremarkable. Portal vein is patent on color Doppler imaging with normal direction of blood flow towards the liver.  Other: None.  IMPRESSION: Cholelithiasis without sonographic evidence of acute cholecystitis.   Electronically Signed   By: Anner Crete M.D.   On: 08/05/2019 17:08  Assessment/Plan:  48 y.o. female with gallstone pancreatitis, complicated by pertinent comorbidities including anemia due to vaginal bleeding and history of  nephrolithiasis.  Patient with history, physical exam and images consistent with gallstone pancreatitis. She has been treated with IVF's, pain management. Pain improving significantly. Lipase in decreasing trend. Patient oriented about diagnosis and surgical management as treatment to avoid recurrence.   Discussed the risk of surgery including post-op  infxn, seroma, biloma, chronic pain, poor-delayed wound healing, retained gallstone, recurrent pancreatitis, conversion to open procedure, post-op SBO or ileus, and need for additional procedures to address said risks.  The risks of general anesthetic including MI, CVA, sudden death or even reaction to anesthetic medications also discussed. Alternatives include continued observation.  Benefits include possible symptom relief, prevention of complications including acute cholecystitis, pancreatitis.  Arnold Long, MD

## 2019-08-06 NOTE — H&P (View-Only) (Signed)
SURGICAL CONSULTATION NOTE   HISTORY OF PRESENT ILLNESS (HPI):  48 y.o. female presented to [facility] ED for evaluation of abdominal pain since 2 days ago. Patient reports pain localized to the epigastric area.  Pain radiated to her back.  Patient reports associated nausea and vomiting.  There was no alleviating or aggravating factor.  Pain has been improving by itself in the last few days.  She came to the ED and she was evaluated with labs and images.  Labs shows elevated lipase and white blood cell count.  Abdominal ultrasound showed a gallbladder full of stones.  MRCP was done without finding of choledocholithiasis.  I personally evaluated the images.  Lipase is decreasing.  Surgery is consulted by Dr. Leslye Peer in this context for evaluation and management of cholelithiasis.  PAST MEDICAL HISTORY (PMH):  Past Medical History:  Diagnosis Date  . Abdominal pain 10/08/2013   Last Assessment & Plan:  Abdominal pain seems multifactorial.  There is a component of pelvic floor myalgia on exam.  Her symptoms are also consistent with possible acute GI viral illness.  No evidence of acute emergent pathology to explain pain.  Advised supportive measures for GI symptoms and pain control with oral medications.  As noted in separate problem, renal stone may also be contributing.  Will follow up in 1-2 weeks to assess progress of symptoms.     . Abnormal uterine bleeding 03/24/2015   Last Assessment & Plan:  EMB pathology results still pending.  Plan to follow up with Dr Tressie Ellis as previously planned once results are available.  No bleeding concerns today.     . Anemia   . Calculus of kidney 01/25/2015   Last Assessment & Plan:  While her abdominal pain symptoms are not entirely consistent with the presence of her kidney stone, it is a possible contributor.  Rx provided for motrin and tamulosin to aid with passage of stone.     . Chronic pelvic pain in female 04/06/2015  . Complication of anesthesia    n/v  .  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 Episode of syncope 10/08/2013  . PONV (postoperative nausea and vomiting)      PAST SURGICAL HISTORY ([place]):  Past Surgical History:  Procedure Laterality Date  . ABDOMINAL HYSTERECTOMY    . CESAREAN SECTION    . CYSTOSCOPY N/A 04/15/2015   Procedure: CYSTOSCOPY;  Surgeon: Boykin Nearing, MD;  Location: [facility] ORS;  Service: Gynecology;  Laterality: N/A;  . CYSTOSCOPY WITH RETROGRADE PYELOGRAM, URETEROSCOPY AND STENT PLACEMENT Left 12/28/2015   Procedure: CYSTOSCOPY WITH RETROGRADE PYELOGRAM, URETEROSCOPY AND STENT PLACEMENT;  Surgeon: Festus Aloe, MD;  Location: [facility];  Service: Urology;  Laterality: Left;  . CYSTOSCOPY WITH STENT PLACEMENT Left 01/18/2016   Procedure: CYSTOSCOPY WITH STENT REMOVAL;  Surgeon: Nickie Retort, MD;  Location: [facility] OR[facility];  Service: Urology;  Laterality: Left;  . HAND TENODESIS    . LAPAROSCOPIC ASSISTED VAGINAL HYSTERECTOMY N/A 04/15/2015   Procedure: LAPAROSCOPIC ASSISTED VAGINAL HYSTERECTOMY;  Surgeon: Boykin Nearing, MD;  Location: [facility] OR[facility];  Service: Gynecology;  Laterality: N/A;  . LAPAROSCOPIC SALPINGO OOPHERECTOMY Bilateral 04/15/2015   Procedure: LAPAROSCOPIC SALPINGO OOPHORECTOMY;  Surgeon: Boykin Nearing, MD;  Location: [facility];  Service: Gynecology;  Laterality: Bilateral;  . URETEROSCOPY WITH HOLMIUM LASER LITHOTRIPSY Left 01/18/2016   Procedure: URETEROSCOPY WITH STONE EXTRACTION;  Surgeon: Nickie Retort, MD;  Location: [facility] ORS;  Service: Urology;  Laterality: Left;     MEDICATIONS:  Prior to Admission medications   Medication Sig Start Date End Date Taking? Authorizing Provider  acetaminophen (TYLENOL) 500 MG tablet Take 500 mg by mouth every 6 (six) hours as needed.   Yes [provider]  meloxicam (MOBIC) 15 MG tablet Take 1 tablet by mouth daily. 07/13/19  Yes [provider]  benzonatate (TESSALON) 200 MG capsule Take 200 mg by mouth 3 (three) times  daily as needed.  08/03/19   [provider]  HYDROcodone-acetaminophen (NORCO) 5-325 MG tablet Take 1 tablet by mouth every 6 (six) hours as needed for moderate pain. 08/05/19   Nance Pear, MD  ondansetron (ZOFRAN) 4 MG tablet Take 1 tablet (4 mg total) by mouth every 8 (eight) hours as needed. 08/05/19   Nance Pear, MD     ALLERGIES:  Allergies  Allergen Reactions  . Ciprodex [Ciprofloxacin-Dexamethasone] Itching  . Oxycodone Nausea And Vomiting  . Sulfamethoxazole-Trimethoprim Nausea And Vomiting and Rash     SOCIAL HISTORY:  Social History   Socioeconomic History  . Marital status: Married    Spouse name: Not on file  . Number of children: Not on file  . Years of education: Not on file  . Highest education level: Not on file  Occupational History  . Not on file  Social Needs  . Financial resource strain: Not on file  . Food insecurity    Worry: Not on file    Inability: Not on file  . Transportation needs    Medical: Not on file    Non-medical: Not on file  Tobacco Use  . Smoking status: Never Smoker  . Smokeless tobacco: Never Used  Substance and Sexual Activity  . Alcohol use: No  . Drug use: No  . Sexual activity: Never    Birth control/protection: None  Lifestyle  . Physical activity    Days per week: Not on file    Minutes per session: Not on file  . Stress: Not on file  Relationships  . Social Herbalist on phone: Not on file    Gets together: Not on file    Attends religious service: Not on file    Active member of club or organization: Not on file    Attends meetings of clubs or organizations: Not on file    Relationship status: Not on file  . Intimate partner violence    Fear of current or ex partner: Not on file    Emotionally abused: Not on file    Physically abused: Not on file    Forced sexual activity: Not on file  Other Topics Concern  . Not on file  Social History Narrative  . Not on file    The patient  currently resides (home / rehab facility / nursing home): Home The patient normally is (ambulatory / bedbound): Ambulatory   FAMILY HISTORY:  No family history on file.   REVIEW OF SYSTEMS:  Constitutional: denies weight loss, fever, chills, or sweats  Eyes: denies any other vision changes, history of eye injury  ENT: denies sore throat, hearing problems  Respiratory: denies shortness of breath, wheezing  Cardiovascular: denies chest pain, palpitations  Gastrointestinal: positive abdominal pain, positive nausea and vomitnig Genitourinary: denies burning with urination or urinary frequency Musculoskeletal: denies any other joint pains or cramps  Skin: denies any other rashes or skin discolorations  Neurological: denies any other headache, dizziness, weakness  Psychiatric: denies any other depression, anxiety   All other review of systems were negative  VITAL SIGNS:  Temp:  [97.9 F (36.6 C)-99.7 F (37.6 C)] 99.7 F (37.6 C) (09/03 0440) Pulse Rate:  [60-74] 64 (09/03 0440) Resp:  [16-18] 16 (09/03 0440) BP: (124-160)/(58-75) 153/67 (09/03 0440) SpO2:  [97 %-100 %] 100 % (09/03 0440) Weight:  [45.4 kg-52.7 kg] 52.7 kg (09/03 0446)     Height: 5\' 2"  (157.5 cm) Weight: 52.7 kg BMI (Calculated): 21.24   INTAKE/OUTPUT:  This shift: Total I/O In: -  Out: 500 [Urine:500]  Last 2 shifts: @IOLAST2SHIFTS @   PHYSICAL EXAM:  Constitutional:  -- Normal body habitus  -- Awake, alert, and oriented x3  Eyes:  -- Pupils equally round and reactive to light  -- No scleral icterus  Ear, nose, and throat:  -- No jugular venous distension  Pulmonary:  -- No crackles  -- Equal breath sounds bilaterally -- Breathing non-labored at rest Cardiovascular:  -- S1, S2 present  -- No pericardial rubs Gastrointestinal:  -- Abdomen soft, nontender, non-distended, no guarding or rebound tenderness -- No abdominal masses appreciated, pulsatile or otherwise  Musculoskeletal and Integumentary:   -- Wounds or skin discoloration: None appreciated -- Extremities: B/L UE and LE FROM, hands and feet warm, no edema  Neurologic:  -- Motor function: intact and symmetric -- Sensation: intact and symmetric   Labs:  CBC Latest Ref Rng & Units 08/06/2019 08/05/2019 03/03/2019  WBC 4.0 - 10.5 K/uL 12.5(H) 11.7(H) 4.9  Hemoglobin 12.0 - 15.0 g/dL 13.5 13.4 13.1  Hematocrit 36.0 - 46.0 % 41.2 41.4 40.4  Platelets 150 - 400 K/uL 244 253 232   CMP Latest Ref Rng & Units 08/06/2019 08/05/2019 03/03/2019  Glucose 70 - 99 mg/dL 118(H) 88 131(H)  BUN 6 - 20 mg/dL 13 18 17   Creatinine 0.44 - 1.00 mg/dL 0.60 0.79 0.61  Sodium 135 - 145 mmol/L 138 142 141  Potassium 3.5 - 5.1 mmol/L 4.0 3.5 3.5  Chloride 98 - 111 mmol/L 103 108 106  CO2 22 - 32 mmol/L 28 27 27   Calcium 8.9 - 10.3 mg/dL 8.9 8.9 9.2  Total Protein 6.5 - 8.1 g/dL 6.9 6.5 7.1  Total Bilirubin 0.3 - 1.2 mg/dL 0.9 0.7 0.6  Alkaline Phos 38 - 126 U/L 78 61 74  AST 15 - 41 U/L 32 14(L) 28  ALT 0 - 44 U/L 33 24 32   Lipase     Component Value Date/Time   LIPASE 102 (H) 08/06/2019 0127     Imaging studies:  EXAM: ULTRASOUND ABDOMEN LIMITED RIGHT UPPER QUADRANT  COMPARISON:  Renal ultrasound dated 12/28/2015 and CT of the abdomen pelvis dated 01/11/2016  FINDINGS: Gallbladder:  The gallbladder is filled with stones. No gallbladder wall thickening or pericholecystic fluid. Negative sonographic Murphy's sign.  Common bile duct:  Diameter: 3 mm  Liver:  The liver is unremarkable. Portal vein is patent on color Doppler imaging with normal direction of blood flow towards the liver.  Other: None.  IMPRESSION: Cholelithiasis without sonographic evidence of acute cholecystitis.   Electronically Signed   By: Anner Crete M.D.   On: 08/05/2019 17:08  Assessment/Plan:  48 y.o. female with gallstone pancreatitis, complicated by pertinent comorbidities including anemia due to vaginal bleeding and history of  nephrolithiasis.  Patient with history, physical exam and images consistent with gallstone pancreatitis. She has been treated with IVF's, pain management. Pain improving significantly. Lipase in decreasing trend. Patient oriented about diagnosis and surgical management as treatment to avoid recurrence.   Discussed the risk of surgery including post-op  infxn, seroma, biloma, chronic pain, poor-delayed wound healing, retained gallstone, recurrent pancreatitis, conversion to open procedure, post-op SBO or ileus, and need for additional procedures to address said risks.  The risks of general anesthetic including MI, CVA, sudden death or even reaction to anesthetic medications also discussed. Alternatives include continued observation.  Benefits include possible symptom relief, prevention of complications including acute cholecystitis, pancreatitis.  Arnold Long, MD

## 2019-08-06 NOTE — Consult Note (Signed)
Vonda Antigua, MD [address] [place], [place], [place], [address] [address], [place], [place], [place], [address] Phone: [phone]  Fax: [phone]  Consultation  Referring Provider:     Dr. Marcille Blanco Primary Care Physician:  Catheryn Bacon, CNM Reason for Consultation:     Pancreatitis  Date of Admission:  08/06/2019 Date of Consultation:  08/06/2019         HPI:   Gail [name] is a 48 y.o. female admitted with abdominal pain with GI being consulted for pancreatitis.  Patient reports 2 to 3-day history of pain starting in the left mid back to left flank area and radiating to the abdomen.  Describes the pain as dull, 5/10.  Associated with nausea and vomiting.  Had an episode of emesis yesterday without blood.  Consisted of food.  No emesis today.  No altered bowel habits.  No diarrhea or constipation.  No blood in stool.  Reports taking an antibiotic for throat infection starting a few days ago.  No other new medications or supplements.  Denies any smoking.  Reports very occasional alcohol use, about once a month, 1 drink at a time.  No previous episodes of pancreatitis.  No prior EGD or colonoscopy.  No family history of colon cancer.  Past Medical History:  Diagnosis Date   Abdominal pain 10/08/2013   Last Assessment & Plan:  Abdominal pain seems multifactorial.  There is a component of pelvic floor myalgia on exam.  Her symptoms are also consistent with possible acute GI viral illness.  No evidence of acute emergent pathology to explain pain.  Advised supportive measures for GI symptoms and pain control with oral medications.  As noted in separate problem, renal stone may also be contributing.  Will follow up in 1-2 weeks to assess progress of symptoms.      Abnormal uterine bleeding 03/24/2015   Last Assessment & Plan:  EMB pathology results still pending.  Plan to follow up with Dr Tressie Ellis as previously planned once results are available.  No bleeding concerns  today.      Anemia    Calculus of kidney 01/25/2015   Last Assessment & Plan:  While her abdominal pain symptoms are not entirely consistent with the presence of her kidney stone, it is a possible contributor.  Rx provided for motrin and tamulosin to aid with passage of stone.      Chronic pelvic pain in female [id number]   Complication of anesthesia    n/v   Dermoid cyst of ovary 04/06/2015   right    Episode of syncope 10/08/2013   PONV (postoperative nausea and vomiting)     Past Surgical History:  Procedure Laterality Date   ABDOMINAL HYSTERECTOMY     CESAREAN SECTION     CYSTOSCOPY N/A 04/15/2015   Procedure: CYSTOSCOPY;  Surgeon: Boykin Nearing, MD;  Location: [facility];  Service: Gynecology;  Laterality: N/A;   CYSTOSCOPY WITH RETROGRADE PYELOGRAM, URETEROSCOPY AND STENT PLACEMENT Left 12/28/2015   Procedure: CYSTOSCOPY WITH RETROGRADE PYELOGRAM, URETEROSCOPY AND STENT PLACEMENT;  Surgeon: Festus Aloe, MD;  Location: [facility];  Service: Urology;  Laterality: Left;   CYSTOSCOPY WITH STENT PLACEMENT Left 01/18/2016   Procedure: CYSTOSCOPY WITH STENT REMOVAL;  Surgeon: Nickie Retort, MD;  Location: [facility];  Service: Urology;  Laterality: Left;   HAND TENODESIS     LAPAROSCOPIC ASSISTED VAGINAL HYSTERECTOMY N/A 04/15/2015   Procedure: LAPAROSCOPIC ASSISTED VAGINAL HYSTERECTOMY;  Surgeon: Boykin Nearing, MD;  Location:  [facility];  Service: Gynecology;  Laterality: N/A;   LAPAROSCOPIC SALPINGO OOPHERECTOMY Bilateral 04/15/2015   Procedure: LAPAROSCOPIC SALPINGO OOPHORECTOMY;  Surgeon: Boykin Nearing, MD;  Location: [facility];  Service: Gynecology;  Laterality: Bilateral;   URETEROSCOPY WITH HOLMIUM LASER LITHOTRIPSY Left 01/18/2016   Procedure: URETEROSCOPY WITH STONE EXTRACTION;  Surgeon: Nickie Retort, MD;  Location: [facility];  Service: Urology;  Laterality: Left;    Prior to Admission medications   Medication Sig Start Date End Date Taking?  Authorizing Provider  acetaminophen (TYLENOL) 500 MG tablet Take 500 mg by mouth every 6 (six) hours as needed.   Yes [provider]  meloxicam (MOBIC) 15 MG tablet Take 1 tablet by mouth daily. 07/13/19  Yes [provider]  benzonatate (TESSALON) 200 MG capsule Take 200 mg by mouth 3 (three) times daily as needed.  08/03/19   [provider]  HYDROcodone-acetaminophen (NORCO) 5-325 MG tablet Take 1 tablet by mouth every 6 (six) hours as needed for moderate pain. 08/05/19   Nance Pear, MD  ondansetron (ZOFRAN) 4 MG tablet Take 1 tablet (4 mg total) by mouth every 8 (eight) hours as needed. 08/05/19   Nance Pear, MD    No family history on file.   Social History   Tobacco Use   Smoking status: Never Smoker   Smokeless tobacco: Never Used  Substance Use Topics   Alcohol use: No   Drug use: No    Allergies as of 08/05/2019 - Review Complete 08/05/2019  Allergen Reaction Noted   Ciprodex [ciprofloxacin-dexamethasone] Itching 04/15/2015   Oxycodone Nausea And Vomiting 04/07/2015   Sulfamethoxazole-trimethoprim Nausea And Vomiting and Rash 01/16/2016    Review of Systems:    All systems reviewed and negative except where noted in HPI.   Physical Exam:  Vital signs in last 24 hours: Vitals:   08/06/19 0200 08/06/19 0220 08/06/19 0440 08/06/19 0446  BP: (!) 142/75 (!) 124/58 (!) 153/67   Pulse: 63 60 64   Resp:   16   Temp:   99.7 F (37.6 C)   TempSrc:   Oral   SpO2: 99% 97% 100%   Weight:    52.7 kg  Height:    5\' 2"  (1.575 m)   Last BM Date: 08/05/19 General:   Pleasant, cooperative in NAD Head:  Normocephalic and atraumatic. Eyes:   No icterus.   Conjunctiva pink. PERRLA. Ears:  Normal auditory acuity. Neck:  Supple; no masses or thyroidomegaly Lungs: Respirations even and unlabored. Lungs clear to auscultation bilaterally.   No wheezes, crackles, or rhonchi.  Abdomen:  Soft, nondistended, nontender. Normal bowel sounds. No  appreciable masses or hepatomegaly.  No rebound or guarding.  Neurologic:  Alert and oriented x3;  grossly normal neurologically. Skin:  Intact without significant lesions or rashes. Cervical Nodes:  No significant cervical adenopathy. Psych:  Alert and cooperative. Normal affect.  LAB RESULTS: Recent Labs    08/05/19 1245 08/06/19 0127  WBC 11.7* 12.5*  HGB 13.4 13.5  HCT 41.4 41.2  PLT 253 244   BMET Recent Labs    08/05/19 1245 08/06/19 0127  NA 142 138  K 3.5 4.0  CL 108 103  CO2 27 28  GLUCOSE 88 118*  BUN 18 13  CREATININE 0.79 0.60  CALCIUM 8.9 8.9   LFT Recent Labs    08/06/19 0127  PROT 6.9  ALBUMIN 4.0  AST 32  ALT 33  ALKPHOS 78  BILITOT 0.9   PT/INR No results for input(s):  LABPROT, INR in the last 72 hours.  STUDIES: Mr 3d Recon At Scanner  Result Date: 08/06/2019 CLINICAL DATA:  48 year old female with history of acute pancreatitis (first episode). Abdominal pain. EXAM: MRI ABDOMEN WITHOUT AND WITH CONTRAST (INCLUDING MRCP) TECHNIQUE: Multiplanar multisequence MR imaging of the abdomen was performed both before and after the administration of intravenous contrast. Heavily T2-weighted images of the biliary and pancreatic ducts were obtained, and three-dimensional MRCP images were rendered by post processing. CONTRAST:  5 mL of Gadavist. COMPARISON:  No priors. FINDINGS: Lower chest: Unremarkable. Hepatobiliary: No suspicious cystic or solid hepatic lesions. MRCP images demonstrate no intra or extrahepatic biliary ductal dilatation. Common bile duct is upper limits of normal measuring 6 mm in the porta hepatis. No filling defects in the common bile duct to suggest choledocholithiasis. Numerous filling defects within the gallbladder compatible with gallstones. Gallbladder does not appear distended. No pericholecystic fluid or surrounding inflammatory changes to suggest an acute cholecystitis at this time. Pancreas: No pancreatic mass. No pancreatic ductal  dilatation noted on MRCP images. No pancreatic or peripancreatic fluid collections or inflammatory changes. Pancreatic parenchyma enhances normally. Spleen:  Unremarkable. Adrenals/Urinary Tract: Bilateral kidneys and adrenal glands are normal in appearance. No hydroureteronephrosis in the visualized portions of the abdomen. Stomach/Bowel: Normal appearance of the stomach. Duodenal diverticulum extending cephalad from the third portion of the duodenum incidentally noted. No surrounding inflammatory changes. Remaining visualized portions of small bowel and colon are grossly unremarkable in appearance. Vascular/Lymphatic: Aortic atherosclerosis, without evidence of aneurysm in the abdominal vasculature. Retroaortic left renal vein (normal anatomical variant) incidentally noted. No lymphadenopathy noted in the abdomen. Other: No significant volume of ascites noted in the visualized portions of the peritoneal cavity. Musculoskeletal: No aggressive appearing osseous lesions are noted in the visualized portions of the skeleton. IMPRESSION: 1. No imaging findings to suggest complicated pancreatitis. 2. No choledocholithiasis or other findings to explain the patient's recent history of acute pancreatitis. 3. Cholelithiasis without evidence of acute cholecystitis. 4. 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 Electronically Signed   By: Vinnie Langton M.D.   On: 08/06/2019 08:11   Mr Abdomen Mrcp Moise Boring Contast  Result Date: 08/06/2019 CLINICAL DATA:  48 year old female with history of acute pancreatitis (first episode). Abdominal pain. EXAM: MRI ABDOMEN WITHOUT AND WITH CONTRAST (INCLUDING MRCP) TECHNIQUE: Multiplanar multisequence MR imaging of the abdomen was performed both before and after the administration of intravenous contrast. Heavily T2-weighted images of the biliary and pancreatic ducts were obtained, and three-dimensional MRCP images were rendered by post processing.  CONTRAST:  5 mL of Gadavist. COMPARISON:  No priors. FINDINGS: Lower chest: Unremarkable. Hepatobiliary: No suspicious cystic or solid hepatic lesions. MRCP images demonstrate no intra or extrahepatic biliary ductal dilatation. Common bile duct is upper limits of normal measuring 6 mm in the porta hepatis. No filling defects in the common bile duct to suggest choledocholithiasis. Numerous filling defects within the gallbladder compatible with gallstones. Gallbladder does not appear distended. No pericholecystic fluid or surrounding inflammatory changes to suggest an acute cholecystitis at this time. Pancreas: No pancreatic mass. No pancreatic ductal dilatation noted on MRCP images. No pancreatic or peripancreatic fluid collections or inflammatory changes. Pancreatic parenchyma enhances normally. Spleen:  Unremarkable. Adrenals/Urinary Tract: Bilateral kidneys and adrenal glands are normal in appearance. No hydroureteronephrosis in the visualized portions of the abdomen. Stomach/Bowel: Normal appearance of the stomach. Duodenal diverticulum extending cephalad from the third portion of the duodenum incidentally noted. No surrounding inflammatory changes. Remaining  visualized portions of small bowel and colon are grossly unremarkable in appearance. Vascular/Lymphatic: Aortic atherosclerosis, without evidence of aneurysm in the abdominal vasculature. Retroaortic left renal vein (normal anatomical variant) incidentally noted. No lymphadenopathy noted in the abdomen. Other: No significant volume of ascites noted in the visualized portions of the peritoneal cavity. Musculoskeletal: No aggressive appearing osseous lesions are noted in the visualized portions of the skeleton. IMPRESSION: 1. No imaging findings to suggest complicated pancreatitis. 2. No choledocholithiasis or other findings to explain the patient's recent history of acute pancreatitis. 3. Cholelithiasis without evidence of acute cholecystitis. 4. Small  duodenal diverticulum in the third portion of the duodenum incidentally noted. Electronically Signed   By: Vinnie Langton M.D.   On: 08/06/2019 08:11   US Abdomen Limited Ruq  Result Date: 08/05/2019 CLINICAL DATA:  48 year old female with epigastric pain. EXAM: ULTRASOUND ABDOMEN LIMITED RIGHT UPPER QUADRANT COMPARISON:  Renal ultrasound dated 12/28/2015 and CT of the abdomen pelvis dated 01/11/2016 FINDINGS: Gallbladder: The gallbladder is filled with stones. No gallbladder wall thickening or pericholecystic fluid. Negative sonographic Murphy's sign. Common bile duct: Diameter: 3 mm Liver: The liver is unremarkable. Portal vein is patent on color Doppler imaging with normal direction of blood flow towards the liver. Other: None. IMPRESSION: Cholelithiasis without sonographic evidence of acute cholecystitis. Electronically Signed   By: Anner Crete M.D.   On: 08/05/2019 17:08      Impression / Plan:   Gail [name] is a 48 y.o. y/o female with right flank pain radiating to the abdomen, with normal liver enzymes, elevated lipase on admission, but no imaging findings of pancreatitis  Do not suspect that the patient's symptoms are from pancreatitis given that there is no inflammation noted on imaging  Lipase can be elevated due to nonspecific causes and could have been elevated due to her nausea vomiting itself  Nausea vomiting has now resolved  Imaging does report gallbladder stones.  Would recommend surgical evaluation for this  Her left flank pain may be musculoskeletal.  Further work-up for this as per primary team  She reported taking ibuprofen 200 mg as needed, only takes 1 pill and then given days when she does take it, maybe takes it about once or twice a week.  Patient was started on Protonix which would help heal any underlying gastritis or small ulcers from NSAIDs as well.  However, she does not have any evidence of bleeding and no indication for urgent endoscopy  If  abdominal pain does not resolve and no other etiology evident, upper endoscopy can be considered at that time.  No indication for endoscopy at this time.  Normal hemoglobin and liver enzymes are otherwise reassuring  Thank you for involving me in the care of this patient.      LOS: 0 days   Virgel Manifold, MD  08/06/2019, 11:56 AM

## 2019-08-06 NOTE — ED Provider Notes (Signed)
[facility] Emergency Department Provider Note  ____________________________________________   First MD Initiated Contact with Patient 08/06/19 0104     (approximate)  I have reviewed the triage vital signs and the nursing notes.   HISTORY  Chief Complaint Abdominal Pain    HPI Gail [name] is a 48 y.o. female who was  seen earlier today in this emergency department for evaluation of abdominal pain, nausea, and vomiting.  She was diagnosed with pancreatitis with some cholelithiasis but a normal diameter common bile duct.  She wanted to go home and be managed as an outpatient but she has not been able to stop vomiting and has persistent pain in her middle upper abdomen.  She describes the symptoms as severe.  Nothing particular is making it better or worse.  No lower abdominal pain.  No contact with COVID-19 patients.  No shortness of breath, cough, sore throat.  She does not drink alcohol and is not on any new medications.        Past Medical History:  Diagnosis Date  . Abdominal pain 10/08/2013   Last Assessment & Plan:  Abdominal pain seems multifactorial.  There is a component of pelvic floor myalgia on exam.  Her symptoms are also consistent with possible acute GI viral illness.  No evidence of acute emergent pathology to explain pain.  Advised supportive measures for GI symptoms and pain control with oral medications.  As noted in separate problem, renal stone may also be contributing.  Will follow up in 1-2 weeks to assess progress of symptoms.     . Abnormal uterine bleeding 03/24/2015   Last Assessment & Plan:  EMB pathology results still pending.  Plan to follow up with Dr Tressie Ellis as previously planned once results are available.  No bleeding concerns today.     . Anemia   . Calculus of kidney 01/25/2015   Last Assessment & Plan:  While her abdominal pain symptoms are not entirely consistent with the presence of her kidney stone, it is a possible  contributor.  Rx provided for motrin and tamulosin to aid with passage of stone.     . Chronic pelvic pain in female 04/06/2015  . Complication of anesthesia    n/v  . 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 Episode of syncope 10/08/2013  . PONV (postoperative nausea and vomiting)     Patient Active Problem List   Diagnosis Date Noted  . Pancreatitis 08/06/2019  . Post-operative state 04/15/2015  . Menorrhagia 04/06/2015  . Chronic pelvic pain in female 04/06/2015  . 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 Dyspareunia 04/06/2015  . Abnormal uterine bleeding 03/24/2015  . Calculus of kidney 01/25/2015  . 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 Abdominal pain 10/08/2013  . Episode of syncope 10/08/2013  . Irregular bleeding 02/25/2013    Past Surgical History:  Procedure Laterality Date  . ABDOMINAL HYSTERECTOMY    . CESAREAN SECTION    . CYSTOSCOPY N/A 04/15/2015   Procedure: CYSTOSCOPY;  Surgeon: Boykin Nearing, MD;  Location: [facility] ORS;  Service: Gynecology;  Laterality: N/A;  . CYSTOSCOPY WITH RETROGRADE PYELOGRAM, URETEROSCOPY AND STENT PLACEMENT Left 12/28/2015   Procedure: CYSTOSCOPY WITH RETROGRADE PYELOGRAM, URETEROSCOPY AND STENT PLACEMENT;  Surgeon: Festus Aloe, MD;  Location: [facility] ORS;  Service: Urology;  Laterality: Left;  . CYSTOSCOPY WITH STENT PLACEMENT Left 01/18/2016   Procedure: CYSTOSCOPY WITH STENT REMOVAL;  Surgeon: Nickie Retort, MD;  Location: [facility] ORS;  Service:  Urology;  Laterality: Left;  . HAND TENODESIS    . LAPAROSCOPIC ASSISTED VAGINAL HYSTERECTOMY N/A 04/15/2015   Procedure: LAPAROSCOPIC ASSISTED VAGINAL HYSTERECTOMY;  Surgeon: Boykin Nearing, MD;  Location: [facility] ORS;  Service: Gynecology;  Laterality: N/A;  . LAPAROSCOPIC SALPINGO OOPHERECTOMY Bilateral 04/15/2015   Procedure: LAPAROSCOPIC SALPINGO OOPHORECTOMY;  Surgeon: Boykin Nearing, MD;  Location: [facility] ORS;  Service: Gynecology;  Laterality: Bilateral;  . URETEROSCOPY WITH  HOLMIUM LASER LITHOTRIPSY Left 01/18/2016   Procedure: URETEROSCOPY WITH STONE EXTRACTION;  Surgeon: Nickie Retort, MD;  Location: [facility] ORS;  Service: Urology;  Laterality: Left;    Prior to Admission medications   Medication Sig Start Date End Date Taking? Authorizing Provider  acetaminophen (TYLENOL) 500 MG tablet Take 500 mg by mouth every 6 (six) hours as needed.   Yes [provider]  meloxicam (MOBIC) 15 MG tablet Take 1 tablet by mouth daily. 07/13/19  Yes [provider]  benzonatate (TESSALON) 200 MG capsule Take 200 mg by mouth 3 (three) times daily as needed.  08/03/19   [provider]  HYDROcodone-acetaminophen (NORCO) 5-325 MG tablet Take 1 tablet by mouth every 6 (six) hours as needed for moderate pain. 08/05/19   Nance Pear, MD  ondansetron (ZOFRAN) 4 MG tablet Take 1 tablet (4 mg total) by mouth every 8 (eight) hours as needed. 08/05/19   Nance Pear, MD    Allergies Ciprodex [ciprofloxacin-dexamethasone], Oxycodone, and Sulfamethoxazole-trimethoprim  No family history on file.  Social History Social History   Tobacco Use  . Smoking status: Never Smoker  . Smokeless tobacco: Never Used  Substance Use Topics  . Alcohol use: No  . Drug use: No    Review of Systems Constitutional: No fever/chills Eyes: No visual changes. ENT: No sore throat. Cardiovascular: Denies chest pain. Respiratory: Denies shortness of breath. Gastrointestinal: Upper middle abdominal pain with nausea and vomiting. Genitourinary: Negative for dysuria. Musculoskeletal: Negative for neck pain.  Negative for back pain. Integumentary: Negative for rash. Neurological: Negative for headaches, focal weakness or numbness.   ____________________________________________   PHYSICAL EXAM:  VITAL SIGNS: ED Triage Vitals  Enc Vitals Group     BP 08/05/19 2252 (!) 158/73     Pulse Rate 08/05/19 2252 73     Resp 08/05/19 2252 18     Temp 08/05/19 2252 97.9 F  (36.6 C)     Temp src --      SpO2 08/05/19 2252 99 %     Weight 08/05/19 2253 45.4 kg (100 lb)     Height 08/05/19 2253 1.575 m (5\' 2" )     Head Circumference --      Peak Flow --      Pain Score 08/05/19 2252 10     Pain Loc --      Pain Edu? --      Excl. in [place]? --     Constitutional: Alert and oriented.  Appears uncomfortable. Eyes: Conjunctivae are normal.  Head: Atraumatic. Nose: No congestion/rhinnorhea. Mouth/Throat: Mucous membranes are moist. Neck: No stridor.  No meningeal signs.   Cardiovascular: Normal rate, regular rhythm. Good peripheral circulation. Grossly normal heart sounds. Respiratory: Normal respiratory effort.  No retractions. Gastrointestinal: Soft and nondistended.  Tenderness to palpation of the epigastrium with some guarding, no rebound. Musculoskeletal: No lower extremity tenderness nor edema. No gross deformities of extremities. Neurologic:  Normal speech and language. No gross focal neurologic deficits are appreciated.  Skin:  Skin is warm, dry and intact.   ____________________________________________  LABS (all labs ordered are listed, but only abnormal results are displayed)  Labs Reviewed  CBC WITH DIFFERENTIAL/PLATELET - Abnormal; Notable for the following components:      Result Value   WBC 12.5 (*)    Neutro Abs 10.1 (*)    Abs Immature Granulocytes 0.09 (*)    All other components within normal limits  COMPREHENSIVE METABOLIC PANEL - Abnormal; Notable for the following components:   Glucose, Bld 118 (*)    All other components within normal limits  LIPASE, BLOOD - Abnormal; Notable for the following components:   Lipase 102 (*)    All other components within normal limits  SARS CORONAVIRUS 2 (HOSPITAL ORDER, [place] [facility])  TSH  HEMOGLOBIN A1C   ____________________________________________  EKG  None - EKG not ordered by ED physician ____________________________________________  RADIOLOGY Ursula Alert, personally viewed and evaluated these images (plain radiographs) as part of my medical decision making, as well as reviewing the written report by the radiologist.  ED MD interpretation: Cholelithiasis without cholecystitis with a normal diameter common bile duct.  Official radiology report(s): US Abdomen Limited Ruq  Result Date: 08/05/2019 CLINICAL DATA:  48 year old female with epigastric pain. EXAM: ULTRASOUND ABDOMEN LIMITED RIGHT UPPER QUADRANT COMPARISON:  Renal ultrasound dated 12/28/2015 and CT of the abdomen pelvis dated 01/11/2016 FINDINGS: Gallbladder: The gallbladder is filled with stones. No gallbladder wall thickening or pericholecystic fluid. Negative sonographic Murphy's sign. Common bile duct: Diameter: 3 mm Liver: The liver is unremarkable. Portal vein is patent on color Doppler imaging with normal direction of blood flow towards the liver. Other: None. IMPRESSION: Cholelithiasis without sonographic evidence of acute cholecystitis. Electronically Signed   By: Anner Crete M.D.   On: 08/05/2019 17:08    ____________________________________________   PROCEDURES   Procedure(s) performed (including Critical Care):  Procedures   ____________________________________________   INITIAL IMPRESSION / MDM / [place] / ED COURSE  As part of my medical decision making, I reviewed the following data within the [place] notes reviewed and incorporated, Labs reviewed , Old chart reviewed, Discussed with admitting physician  and Notes from prior ED visits   Differential diagnosis includes, but is not limited to, idiopathic pancreatitis, choledocholithiasis with pancreatitis, medication side effect.  The patient failed outpatient management and will need to be admitted for symptomatic treatment of her pancreatitis.  No indication for CT scan.  No indication for emergent MRCP but I will discuss ordering 1 upon admission with the  hospitalist team.  Lipase is actually trended down a little bit, labs otherwise unremarkable.  I am giving the patient morphine 4 mg IV, Zofran 4 mg IV, 1 L normal saline bolus followed by 100 mL/h normal saline infusion, and n.p.o. status.  Patient understands and agrees with the plan.      Clinical Course as of Aug 06 427  Thu Aug 06, 2019  0213 Sent secure chat message to Drs. Jones Skene with the hospitalist service for admission.   [CF]  [id number] I discussed the case by phone with Dr. Marcille Blanco and he will admit.   [CF]    Clinical Course User Index [CF] Hinda Kehr, MD     ____________________________________________  FINAL CLINICAL IMPRESSION(S) / ED DIAGNOSES  Final diagnoses:  Acute pancreatitis, unspecified complication status, unspecified pancreatitis type     MEDICATIONS GIVEN DURING THIS VISIT:  Medications  0.9 %  sodium chloride infusion (has no administration in time range)  ondansetron (  ZOFRAN) tablet 4 mg (has no administration in time range)    Or  ondansetron (ZOFRAN) injection 4 mg (has no administration in time range)  morphine 2 MG/ML injection 2 mg (has no administration in time range)  HYDROmorphone (DILAUDID) injection 0.5 mg (has no administration in time range)  morphine 4 MG/ML injection 4 mg (4 mg Intravenous Given 08/06/19 0125)  ondansetron (ZOFRAN) injection 4 mg (4 mg Intravenous Given 08/06/19 0125)  sodium chloride 0.9 % bolus 1,000 mL (0 mLs Intravenous Stopped 08/06/19 0218)  0.9 %  sodium chloride infusion ( Intravenous New Bag/Given 08/06/19 0223)  gadobutrol (GADAVIST) 1 MMOL/ML injection 5 mL (5 mLs Intravenous Contrast Given 08/06/19 0409)     ED Discharge Orders    None      *Please note:  Kayly P Lutterman was evaluated in Emergency Department on 08/06/2019 for the symptoms described in the history of present illness. She was evaluated in the context of the global COVID-19 pandemic, which necessitated consideration that the  patient might be at risk for infection with the SARS-CoV-2 virus that causes COVID-19. Institutional protocols and algorithms that pertain to the evaluation of patients at risk for COVID-19 are in a state of rapid change based on information released by regulatory bodies including the CDC and federal and state organizations. These policies and algorithms were followed during the patient's care in the ED.  Some ED evaluations and interventions may be delayed as a result of limited staffing during the pandemic.*  Note:  This document was prepared using Dragon voice recognition software and may include unintentional dictation errors.   Hinda Kehr, MD 08/06/19 [phone]

## 2019-08-06 NOTE — ED Notes (Signed)
ED TO INPATIENT HANDOFF REPORT  ED Nurse Name and Phone #: Kersten Salmons  S Name/Age/Gender Gail [name] 48 y.o. female Room/Bed: ED12A/ED12A  Code Status   Code Status: Prior  Home/SNF/Other Home Patient oriented to: self, place, time and situation Is this baseline? Yes   Triage Complete: Triage complete  Chief Complaint Nausea Vomiting  Triage Note Pt back to the ER for admission. Pt was just seen a few hours ago by Dr Archie Balboa who recommended admission. Pt declined. Pt says the pain is worse and is unable to tolerate at home.    Allergies Allergies  Allergen Reactions  . Ciprodex [Ciprofloxacin-Dexamethasone] Itching  . Oxycodone Nausea And Vomiting  . Sulfamethoxazole-Trimethoprim Nausea And Vomiting and Rash    Level of Care/Admitting Diagnosis ED Disposition    ED Disposition Condition [facility] [facility] Area: [place] [100120]  Level of Care: Med-Surg [16]  Covid Evaluation: Asymptomatic Screening Protocol (No Symptoms)  Diagnosis: Pancreatitis [id number]  Admitting Physician: Harrie Foreman [id number]  Attending Physician: Harrie Foreman [id number]  Estimated length of stay: past midnight tomorrow  Certification:: I certify this patient will need inpatient services for at least 2 midnights  PT Class (Do Not Modify): Inpatient [101]  PT Acc Code (Do Not Modify): Private [1]       B Medical/Surgery History Past Medical History:  Diagnosis Date  . Abdominal pain 10/08/2013   Last Assessment & Plan:  Abdominal pain seems multifactorial.  There is a component of pelvic floor myalgia on exam.  Her symptoms are also consistent with possible acute GI viral illness.  No evidence of acute emergent pathology to explain pain.  Advised supportive measures for GI symptoms and pain control with oral medications.  As noted in separate problem, renal stone may also be contributing.  Will follow up in 1-2 weeks to assess progress of  symptoms.     . Abnormal uterine bleeding 03/24/2015   Last Assessment & Plan:  EMB pathology results still pending.  Plan to follow up with Dr Tressie Ellis as previously planned once results are available.  No bleeding concerns today.     . Anemia   . Calculus of kidney 01/25/2015   Last Assessment & Plan:  While her abdominal pain symptoms are not entirely consistent with the presence of her kidney stone, it is a possible contributor.  Rx provided for motrin and tamulosin to aid with passage of stone.     . Chronic pelvic pain in female 04/06/2015  . Complication of anesthesia    n/v  . 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 Episode of syncope 10/08/2013  . PONV (postoperative nausea and vomiting)    Past Surgical History:  Procedure Laterality Date  . ABDOMINAL HYSTERECTOMY    . CESAREAN SECTION    . CYSTOSCOPY N/A 04/15/2015   Procedure: CYSTOSCOPY;  Surgeon: Boykin Nearing, MD;  Location: [facility] ORS;  Service: Gynecology;  Laterality: N/A;  . CYSTOSCOPY WITH RETROGRADE PYELOGRAM, URETEROSCOPY AND STENT PLACEMENT Left 12/28/2015   Procedure: CYSTOSCOPY WITH RETROGRADE PYELOGRAM, URETEROSCOPY AND STENT PLACEMENT;  Surgeon: Festus Aloe, MD;  Location: [facility] OR[facility];  Service: Urology;  Laterality: Left;  . CYSTOSCOPY WITH STENT PLACEMENT Left 01/18/2016   Procedure: CYSTOSCOPY WITH STENT REMOVAL;  Surgeon: Nickie Retort, MD;  Location: [facility] OR[facility];  Service: Urology;  Laterality: Left;  . HAND TENODESIS    . LAPAROSCOPIC ASSISTED VAGINAL HYSTERECTOMY N/A 04/15/2015   Procedure: LAPAROSCOPIC ASSISTED VAGINAL  HYSTERECTOMY;  Surgeon: Boykin Nearing, MD;  Location: [facility];  Service: Gynecology;  Laterality: N/A;  . LAPAROSCOPIC SALPINGO OOPHERECTOMY Bilateral 04/15/2015   Procedure: LAPAROSCOPIC SALPINGO OOPHORECTOMY;  Surgeon: Boykin Nearing, MD;  Location: [facility];  Service: Gynecology;  Laterality: Bilateral;  . URETEROSCOPY WITH HOLMIUM LASER LITHOTRIPSY Left 01/18/2016    Procedure: URETEROSCOPY WITH STONE EXTRACTION;  Surgeon: Nickie Retort, MD;  Location: [facility];  Service: Urology;  Laterality: Left;     A IV Location/Drains/Wounds Patient Lines/Drains/Airways Status   Active Line/Drains/Airways    Name:   Placement date:   Placement time:   Site:   Days:   Peripheral IV 08/05/19 Left Antecubital   08/05/19    1526    Antecubital   1   Peripheral IV 08/06/19 Left Antecubital   08/06/19    0125    Antecubital   less than 1   Incision (Closed) [id number] Umbilicus   [id number]    [id number]     1574   Incision (Closed) 04/15/15 Abdomen Left;Lower   04/15/15    0844     1574   Incision (Closed) 04/15/15 Abdomen Right;Lower   04/15/15    0844     1574   Incision (Closed) 12/28/15 Perineum   12/28/15    2151     1317   Incision (Closed) 01/18/16 Perineum Other (Comment)   01/18/16    0724     1296          Intake/Output Last 24 hours No intake or output data in the 24 hours ending 08/06/19 0259  Labs/Imaging Results for orders placed or performed during the hospital encounter of 08/06/19 (from the past 48 hour(s))  SARS Coronavirus 2 [facility] order, Performed in [facility] hospital lab) Nasopharyngeal Nasopharyngeal Swab     Status: None   Collection Time: 08/06/19  1:27 AM   Specimen: Nasopharyngeal Swab  Result Value Ref Range   SARS Coronavirus 2 NEGATIVE NEGATIVE    Comment: (NOTE) If result is NEGATIVE SARS-CoV-2 target nucleic acids are NOT DETECTED. The SARS-CoV-2 RNA is generally detectable in upper and lower  respiratory specimens during the acute phase of infection. The lowest  concentration of SARS-CoV-2 viral copies this assay can detect is 250  copies / mL. A negative result does not preclude SARS-CoV-2 infection  and should not be used as the sole basis for treatment or other  patient management decisions.  A negative result may occur with  improper specimen collection / handling, submission of specimen other  than nasopharyngeal  swab, presence of viral mutation(s) within the  areas targeted by this assay, and inadequate number of viral copies  (<250 copies / mL). A negative result must be combined with clinical  observations, patient history, and epidemiological information. If result is POSITIVE SARS-CoV-2 target nucleic acids are DETECTED. The SARS-CoV-2 RNA is generally detectable in upper and lower  respiratory specimens dur ing the acute phase of infection.  Positive  results are indicative of active infection with SARS-CoV-2.  Clinical  correlation with patient history and other diagnostic information is  necessary to determine patient infection status.  Positive results do  not rule out bacterial infection or co-infection with other viruses. If result is PRESUMPTIVE POSTIVE SARS-CoV-2 nucleic acids MAY BE PRESENT.   A presumptive positive result was obtained on the submitted specimen  and confirmed on repeat testing.  While 2019 novel coronavirus  (SARS-CoV-2) nucleic acids may be present in the submitted sample  additional  confirmatory testing may be necessary for epidemiological  and / or clinical management purposes  to differentiate between  SARS-CoV-2 and other Sarbecovirus currently known to infect humans.  If clinically indicated additional testing with an alternate test  methodology [phone]) is advised. The SARS-CoV-2 RNA is generally  detectable in upper and lower respiratory sp ecimens during the acute  phase of infection. The expected result is Negative. Fact Sheet for Patients:  StrictlyIdeas.no Fact Sheet for Healthcare Providers: BankingDealers.co.za This test is not yet approved or cleared by the [place] FDA and has been authorized for detection and/or diagnosis of SARS-CoV-2 by FDA under an Emergency Use Authorization (EUA).  This EUA will remain in effect (meaning this test can be used) for the duration of the COVID-19 declaration  under Section 564(b)(1) of the Act, 21 U.S.C. section 360bbb-3(b)(1), unless the authorization is terminated or revoked sooner. Performed at [facility], [address] [place], [place] [address]   CBC with Differential/Platelet     Status: Abnormal   Collection Time: 08/06/19  1:27 AM  Result Value Ref Range   WBC 12.5 (H) 4.0 - 10.5 K/uL   RBC 4.74 3.87 - 5.11 MIL/uL   Hemoglobin 13.5 12.0 - 15.0 g/dL   HCT 41.2 36.0 - 46.0 %   MCV 86.9 80.0 - 100.0 fL   MCH 28.5 26.0 - 34.0 pg   MCHC 32.8 30.0 - 36.0 g/dL   RDW 12.8 11.5 - 15.5 %   Platelets 244 150 - 400 K/uL   nRBC 0.0 0.0 - 0.2 %   Neutrophils Relative % 81 %   Neutro Abs 10.1 (H) 1.7 - 7.7 K/uL   Lymphocytes Relative 10 %   Lymphs Abs 1.3 0.7 - 4.0 K/uL   Monocytes Relative 7 %   Monocytes Absolute 0.9 0.1 - 1.0 K/uL   Eosinophils Relative 1 %   Eosinophils Absolute 0.1 0.0 - 0.5 K/uL   Basophils Relative 0 %   Basophils Absolute 0.0 0.0 - 0.1 K/uL   Immature Granulocytes 1 %   Abs Immature Granulocytes 0.09 (H) 0.00 - 0.07 K/uL    Comment: Performed at [facility], [address] [place], [place] [address]  Comprehensive metabolic panel     Status: Abnormal   Collection Time: 08/06/19  1:27 AM  Result Value Ref Range   Sodium 138 135 - 145 mmol/L   Potassium 4.0 3.5 - 5.1 mmol/L   Chloride 103 98 - 111 mmol/L   CO2 28 22 - 32 mmol/L   Glucose, Bld 118 (H) 70 - 99 mg/dL   BUN 13 6 - 20 mg/dL   Creatinine, Ser 0.60 0.44 - 1.00 mg/dL   Calcium 8.9 8.9 - 10.3 mg/dL   Total Protein 6.9 6.5 - 8.1 g/dL   Albumin 4.0 3.5 - 5.0 g/dL   AST 32 15 - 41 U/L   ALT 33 0 - 44 U/L   Alkaline Phosphatase 78 38 - 126 U/L   Total Bilirubin 0.9 0.3 - 1.2 mg/dL   GFR calc non Af Amer >60 >60 mL/min   GFR calc Af Amer >60 >60 mL/min   Anion gap 7 5 - 15    Comment: Performed at [facility], [address] [place], [place] [address]  Lipase, blood     Status: Abnormal   Collection Time:  08/06/19  1:27 AM  Result Value Ref Range   Lipase 102 (H) 11 - 51 U/L    Comment: Performed at  [place], [place] [address]   US Abdomen Limited Ruq  Result Date: 08/05/2019 CLINICAL DATA:  48 year old female with epigastric pain. EXAM: ULTRASOUND ABDOMEN LIMITED RIGHT UPPER QUADRANT COMPARISON:  Renal ultrasound dated 12/28/2015 and CT of the abdomen pelvis dated 01/11/2016 FINDINGS: Gallbladder: The gallbladder is filled with stones. No gallbladder wall thickening or pericholecystic fluid. Negative sonographic Murphy's sign. Common bile duct: Diameter: 3 mm Liver: The liver is unremarkable. Portal vein is patent on color Doppler imaging with normal direction of blood flow towards the liver. Other: None. IMPRESSION: Cholelithiasis without sonographic evidence of acute cholecystitis. Electronically Signed   By: Anner Crete M.D.   On: 08/05/2019 17:08    Pending Labs [employer] (From admission, onward)    Start     Ordered   Signed and Held  TSH  Add-on,   R     Signed and Held   Signed and Held  Hemoglobin A1c  Add-on,   R     Signed and Held          Vitals/Pain Today's Vitals   08/06/19 0124 08/06/19 0200 08/06/19 0220 08/06/19 0224  BP: 133/71 (!) 142/75 (!) 124/58   Pulse: 73 63 60   Resp:      Temp:      SpO2: 98% 99% 97%   Weight:      Height:      PainSc:    4     Isolation Precautions No active isolations  Medications Medications  morphine 4 MG/ML injection 4 mg (4 mg Intravenous Given 08/06/19 0125)  ondansetron (ZOFRAN) injection 4 mg (4 mg Intravenous Given 08/06/19 0125)  sodium chloride 0.9 % bolus 1,000 mL (0 mLs Intravenous Stopped 08/06/19 0218)  0.9 %  sodium chloride infusion ( Intravenous New Bag/Given 08/06/19 0223)    Mobility walks Low fall risk   Focused Assessments Cardiac Assessment Handoff:    Lab Results  Component Value Date   TROPONINI <0.03 03/03/2019   No results found for: DDIMER Does  the Patient currently have chest pain? No     R Recommendations: See Admitting Provider Note  Report given to:   Additional Notes: none

## 2019-08-06 NOTE — ED Notes (Signed)
Report called to 2nd floor nurse

## 2019-08-06 NOTE — H&P (Signed)
Gail [name] is an 48 y.o. female.   Chief Complaint: Abdominal pain HPI: The patient with past medical history of abnormal uterine bleeding secondary to dermoid cysts as well as kidney stones presents to the emergency department complaining of abdominal pain.  The patient has had nonbloody nonbilious emesis for 1 day.  Laboratory evaluation showed elevated lipase consistent with pancreatitis.  Ultrasound of right upper quadrant showed gallstones but normal common bile duct.  The patient was given a trial of antiemetics and pain medication to determine if she would tolerate outpatient management, however she was unable to keep food down and to tolerate pain which prompted the emergency department staff to call the hospitalist service for further management.  Past Medical History:  Diagnosis Date  . Abdominal pain 10/08/2013   Last Assessment & Plan:  Abdominal pain seems multifactorial.  There is a component of pelvic floor myalgia on exam.  Her symptoms are also consistent with possible acute GI viral illness.  No evidence of acute emergent pathology to explain pain.  Advised supportive measures for GI symptoms and pain control with oral medications.  As noted in separate problem, renal stone may also be contributing.  Will follow up in 1-2 weeks to assess progress of symptoms.     . Abnormal uterine bleeding 03/24/2015   Last Assessment & Plan:  EMB pathology results still pending.  Plan to follow up with Dr Tressie Ellis as previously planned once results are available.  No bleeding concerns today.     . Anemia   . Calculus of kidney 01/25/2015   Last Assessment & Plan:  While her abdominal pain symptoms are not entirely consistent with the presence of her kidney stone, it is a possible contributor.  Rx provided for motrin and tamulosin to aid with passage of stone.     . Chronic pelvic pain in female 04/06/2015  . Complication of anesthesia    n/v  . 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  Episode of syncope 10/08/2013  . PONV (postoperative nausea and vomiting)     Past Surgical History:  Procedure Laterality Date  . ABDOMINAL HYSTERECTOMY    . CESAREAN SECTION    . CYSTOSCOPY N/A 04/15/2015   Procedure: CYSTOSCOPY;  Surgeon: Boykin Nearing, MD;  Location: [facility];  Service: Gynecology;  Laterality: N/A;  . CYSTOSCOPY WITH RETROGRADE PYELOGRAM, URETEROSCOPY AND STENT PLACEMENT Left 12/28/2015   Procedure: CYSTOSCOPY WITH RETROGRADE PYELOGRAM, URETEROSCOPY AND STENT PLACEMENT;  Surgeon: Festus Aloe, MD;  Location: [facility];  Service: Urology;  Laterality: Left;  . CYSTOSCOPY WITH STENT PLACEMENT Left 01/18/2016   Procedure: CYSTOSCOPY WITH STENT REMOVAL;  Surgeon: Nickie Retort, MD;  Location: [facility];  Service: Urology;  Laterality: Left;  . HAND TENODESIS    . LAPAROSCOPIC ASSISTED VAGINAL HYSTERECTOMY N/A 04/15/2015   Procedure: LAPAROSCOPIC ASSISTED VAGINAL HYSTERECTOMY;  Surgeon: Boykin Nearing, MD;  Location: [facility];  Service: Gynecology;  Laterality: N/A;  . LAPAROSCOPIC SALPINGO OOPHERECTOMY Bilateral 04/15/2015   Procedure: LAPAROSCOPIC SALPINGO OOPHORECTOMY;  Surgeon: Boykin Nearing, MD;  Location: [facility];  Service: Gynecology;  Laterality: Bilateral;  . URETEROSCOPY WITH HOLMIUM LASER LITHOTRIPSY Left 01/18/2016   Procedure: URETEROSCOPY WITH STONE EXTRACTION;  Surgeon: Nickie Retort, MD;  Location: [facility];  Service: Urology;  Laterality: Left;    No family history on file. Patient cannot recall at the moment (due to pain and/or analgesic)  Social History:  reports that she has never smoked. She  has never used smokeless tobacco. She reports that she does not drink alcohol or use drugs.  Allergies:  Allergies  Allergen Reactions  . Ciprodex [Ciprofloxacin-Dexamethasone] Itching  . Oxycodone Nausea And Vomiting  . Sulfamethoxazole-Trimethoprim Nausea And Vomiting and Rash    Medications Prior to Admission  Medication Sig  Dispense Refill  . acetaminophen (TYLENOL) 500 MG tablet Take 500 mg by mouth every 6 (six) hours as needed.    . meloxicam (MOBIC) 15 MG tablet Take 1 tablet by mouth daily.    . benzonatate (TESSALON) 200 MG capsule Take 200 mg by mouth 3 (three) times daily as needed.     Marland Kitchen HYDROcodone-acetaminophen (NORCO) 5-325 MG tablet Take 1 tablet by mouth every 6 (six) hours as needed for moderate pain. 15 tablet 0  . ondansetron (ZOFRAN) 4 MG tablet Take 1 tablet (4 mg total) by mouth every 8 (eight) hours as needed. 20 tablet 0    Results for orders placed or performed during the hospital encounter of 08/06/19 (from the past 48 hour(s))  SARS Coronavirus 2 [facility] order, Performed in [facility] hospital lab) Nasopharyngeal Nasopharyngeal Swab     Status: None   Collection Time: 08/06/19  1:27 AM   Specimen: Nasopharyngeal Swab  Result Value Ref Range   SARS Coronavirus 2 NEGATIVE NEGATIVE    Comment: (NOTE) If result is NEGATIVE SARS-CoV-2 target nucleic acids are NOT DETECTED. The SARS-CoV-2 RNA is generally detectable in upper and lower  respiratory specimens during the acute phase of infection. The lowest  concentration of SARS-CoV-2 viral copies this assay can detect is 250  copies / mL. A negative result does not preclude SARS-CoV-2 infection  and should not be used as the sole basis for treatment or other  patient management decisions.  A negative result may occur with  improper specimen collection / handling, submission of specimen other  than nasopharyngeal swab, presence of viral mutation(s) within the  areas targeted by this assay, and inadequate number of viral copies  (<250 copies / mL). A negative result must be combined with clinical  observations, patient history, and epidemiological information. If result is POSITIVE SARS-CoV-2 target nucleic acids are DETECTED. The SARS-CoV-2 RNA is generally detectable in upper and lower  respiratory specimens dur ing the acute phase  of infection.  Positive  results are indicative of active infection with SARS-CoV-2.  Clinical  correlation with patient history and other diagnostic information is  necessary to determine patient infection status.  Positive results do  not rule out bacterial infection or co-infection with other viruses. If result is PRESUMPTIVE POSTIVE SARS-CoV-2 nucleic acids MAY BE PRESENT.   A presumptive positive result was obtained on the submitted specimen  and confirmed on repeat testing.  While 2019 novel coronavirus  (SARS-CoV-2) nucleic acids may be present in the submitted sample  additional confirmatory testing may be necessary for epidemiological  and / or clinical management purposes  to differentiate between  SARS-CoV-2 and other Sarbecovirus currently known to infect humans.  If clinically indicated additional testing with an alternate test  methodology [phone]) is advised. The SARS-CoV-2 RNA is generally  detectable in upper and lower respiratory sp ecimens during the acute  phase of infection. The expected result is Negative. Fact Sheet for Patients:  StrictlyIdeas.no Fact Sheet for Healthcare Providers: BankingDealers.co.za This test is not yet approved or cleared by the [place] FDA and has been authorized for detection and/or diagnosis of SARS-CoV-2 by FDA under an Emergency Use Authorization (EUA).  This  EUA will remain in effect (meaning this test can be used) for the duration of the COVID-19 declaration under Section 564(b)(1) of the Act, 21 U.S.C. section 360bbb-3(b)(1), unless the authorization is terminated or revoked sooner. Performed at [facility], [address] [place], [place] [address]   CBC with Differential/Platelet     Status: Abnormal   Collection Time: 08/06/19  1:27 AM  Result Value Ref Range   WBC 12.5 (H) 4.0 - 10.5 K/uL   RBC 4.74 3.87 - 5.11 MIL/uL   Hemoglobin 13.5 12.0 - 15.0 g/dL   HCT  41.2 36.0 - 46.0 %   MCV 86.9 80.0 - 100.0 fL   MCH 28.5 26.0 - 34.0 pg   MCHC 32.8 30.0 - 36.0 g/dL   RDW 12.8 11.5 - 15.5 %   Platelets 244 150 - 400 K/uL   nRBC 0.0 0.0 - 0.2 %   Neutrophils Relative % 81 %   Neutro Abs 10.1 (H) 1.7 - 7.7 K/uL   Lymphocytes Relative 10 %   Lymphs Abs 1.3 0.7 - 4.0 K/uL   Monocytes Relative 7 %   Monocytes Absolute 0.9 0.1 - 1.0 K/uL   Eosinophils Relative 1 %   Eosinophils Absolute 0.1 0.0 - 0.5 K/uL   Basophils Relative 0 %   Basophils Absolute 0.0 0.0 - 0.1 K/uL   Immature Granulocytes 1 %   Abs Immature Granulocytes 0.09 (H) 0.00 - 0.07 K/uL    Comment: Performed at [facility], [address] [place], [place] [address]  Comprehensive metabolic panel     Status: Abnormal   Collection Time: 08/06/19  1:27 AM  Result Value Ref Range   Sodium 138 135 - 145 mmol/L   Potassium 4.0 3.5 - 5.1 mmol/L   Chloride 103 98 - 111 mmol/L   CO2 28 22 - 32 mmol/L   Glucose, Bld 118 (H) 70 - 99 mg/dL   BUN 13 6 - 20 mg/dL   Creatinine, Ser 0.60 0.44 - 1.00 mg/dL   Calcium 8.9 8.9 - 10.3 mg/dL   Total Protein 6.9 6.5 - 8.1 g/dL   Albumin 4.0 3.5 - 5.0 g/dL   AST 32 15 - 41 U/L   ALT 33 0 - 44 U/L   Alkaline Phosphatase 78 38 - 126 U/L   Total Bilirubin 0.9 0.3 - 1.2 mg/dL   GFR calc non Af Amer >60 >60 mL/min   GFR calc Af Amer >60 >60 mL/min   Anion gap 7 5 - 15    Comment: Performed at [facility], [address] [place], [place] [address]  Lipase, blood     Status: Abnormal   Collection Time: 08/06/19  1:27 AM  Result Value Ref Range   Lipase 102 (H) 11 - 51 U/L    Comment: Performed at [facility], [address] [place], [place] [address]  TSH     Status: None   Collection Time: 08/06/19  1:27 AM  Result Value Ref Range   TSH 0.801 0.350 - 4.500 uIU/mL    Comment: Performed by a 3rd Generation assay with a functional sensitivity of <=0.01 uIU/mL. Performed at [facility], [address],  [place], [place] [address]    US Abdomen Limited Ruq  Result Date: 08/05/2019 CLINICAL DATA:  49 year old female with epigastric pain. EXAM: ULTRASOUND ABDOMEN LIMITED RIGHT UPPER QUADRANT COMPARISON:  Renal ultrasound dated 12/28/2015 and CT of the abdomen pelvis dated 01/11/2016 FINDINGS: Gallbladder: The gallbladder is filled  with stones. No gallbladder wall thickening or pericholecystic fluid. Negative sonographic Murphy's sign. Common bile duct: Diameter: 3 mm Liver: The liver is unremarkable. Portal vein is patent on color Doppler imaging with normal direction of blood flow towards the liver. Other: None. IMPRESSION: Cholelithiasis without sonographic evidence of acute cholecystitis. Electronically Signed   By: Anner Crete M.D.   On: 08/05/2019 17:08    Review of Systems  Constitutional: Negative for chills and fever.  HENT: Negative for sore throat and tinnitus.   Eyes: Negative for blurred vision and redness.  Respiratory: Negative for cough and shortness of breath.   Cardiovascular: Negative for chest pain, palpitations, orthopnea and PND.  Gastrointestinal: Positive for abdominal pain, nausea and vomiting. Negative for diarrhea.  Genitourinary: Negative for dysuria, frequency and urgency.  Musculoskeletal: Negative for joint pain and myalgias.  Skin: Negative for rash.       No lesions  Neurological: Negative for speech change, focal weakness and weakness.  Endo/Heme/Allergies: Does not bruise/bleed easily.       No temperature intolerance  Psychiatric/Behavioral: Negative for depression and suicidal ideas.    Blood pressure (!) 153/67, pulse 64, temperature 99.7 F (37.6 C), temperature source Oral, resp. rate 16, height 5\' 2"  (1.575 m), weight 52.7 kg, last menstrual period 04/01/2015, SpO2 100 %. Physical Exam  Vitals reviewed. Constitutional: She is oriented to person, place, and time. She appears well-developed and well-nourished. No distress.  HENT:  Head: Normocephalic  and atraumatic.  Mouth/Throat: Oropharynx is clear and moist.  Eyes: Pupils are equal, round, and reactive to light. Conjunctivae and EOM are normal. No scleral icterus.  Neck: Normal range of motion. Neck supple. No JVD present. No tracheal deviation present. No thyromegaly present.  Cardiovascular: Normal rate, regular rhythm and normal heart sounds. Exam reveals no gallop and no friction rub.  No murmur heard. Respiratory: Effort normal and breath sounds normal.  GI: Soft. Bowel sounds are normal. She exhibits no distension. There is no abdominal tenderness.  Genitourinary:    Genitourinary Comments: Deferred   Musculoskeletal: Normal range of motion.        General: No edema.  Lymphadenopathy:    She has no cervical adenopathy.  Neurological: She is alert and oriented to person, place, and time. No cranial nerve deficit. She exhibits normal muscle tone.  Skin: Skin is warm and dry. No rash noted. No erythema.  Psychiatric: She has a normal mood and affect. Her behavior is normal. Judgment and thought content normal.     Assessment/Plan This is a 48 year old female admitted for pancreatitis. 1.  Pancreatitis: N.p.o. for now; hydrate aggressively with intravenous fluid.  Manage pain and nausea.  Renal function and calcium normal.  Mild elevation white blood cell count without signs or symptoms of sepsis.  Consider IV antibiotics if the patient is to undergo instrumentation later today. 2.  Gallstones: No visual evidence of obstruction at this time.  Obtain MRCP.  Consult gastroenterology 3.  Elevated blood pressure: The patient does not carry a diagnosis of hypertension.  Elevated blood pressure likely secondary to pain.  Continue to monitor.  Beta-blocker is needed 4.  DVT prophylaxis: SCDs 5.  GI prophylaxis: Add PPI to regimen The patient is a full code.  Time spent on admission orders and patient care approximately 45 minutes  Harrie Foreman, MD 08/06/2019, 7:51 AM

## 2019-08-06 NOTE — Progress Notes (Signed)
Patient ID: Gail [name], female   DOB: 1970-12-20, 48 y.o.   MRN: [id number]  [facility] PROGRESS NOTE  Gail [name] [id number] DOB: 1971-03-16 DOA: 08/06/2019 PCP: Catheryn Bacon, CNM  HPI/Subjective: Patient states that she has not been feeling well for a while.  She was put on an antibiotic for sore throat.  She was put on prednisone for numbness in her leg.  Since then has not been feeling well.  She has been having abdominal pain for a few days.  Pain is 8 out of 10 in intensity.  Associated with nausea and vomiting after eating.  The pain does come and go.  Objective: Vitals:   08/06/19 0220 08/06/19 0440  BP: (!) 124/58 (!) 153/67  Pulse: 60 64  Resp:  16  Temp:  99.7 F (37.6 C)  SpO2: 97% 100%    Intake/Output Summary (Last 24 hours) at 08/06/2019 1402 Last data filed at 08/06/2019 1015 Gross per 24 hour  Intake -  Output 500 ml  Net -500 ml   Filed Weights   08/05/19 2253 08/06/19 0446  Weight: 45.4 kg 52.7 kg    ROS: Review of Systems  Constitutional: Negative for chills and fever.  Eyes: Negative for blurred vision.  Respiratory: Negative for cough and shortness of breath.   Cardiovascular: Negative for chest pain.  Gastrointestinal: Positive for abdominal pain, nausea and vomiting. Negative for constipation and diarrhea.  Genitourinary: Negative for dysuria.  Musculoskeletal: Negative for joint pain.  Neurological: Negative for dizziness and headaches.   Exam: Physical Exam  Constitutional: She is oriented to person, place, and time.  HENT:  Nose: No mucosal edema.  Mouth/Throat: No oropharyngeal exudate or posterior oropharyngeal edema.  Eyes: Pupils are equal, round, and reactive to light. Conjunctivae, EOM and lids are normal.  Neck: No JVD present. Carotid bruit is not present. No edema present. No thyroid mass and no thyromegaly present.  Cardiovascular: S1 normal and S2 normal. Exam reveals no gallop.  No murmur  heard. Pulses:      Dorsalis pedis pulses are 2+ on the right side and 2+ on the left side.  Respiratory: No respiratory distress. She has no wheezes. She has no rhonchi. She has no rales.  GI: Soft. Bowel sounds are normal. There is abdominal tenderness in the right upper quadrant.  Musculoskeletal:     Right ankle: She exhibits no swelling.     Left ankle: She exhibits no swelling.  Lymphadenopathy:    She has no cervical adenopathy.  Neurological: She is alert and oriented to person, place, and time. No cranial nerve deficit.  Skin: Skin is warm. No rash noted. Nails show no clubbing.  Psychiatric: She has a normal mood and affect.      Data Reviewed: Basic Metabolic Panel: Recent Labs  Lab 08/05/19 1245 08/06/19 0127  NA 142 138  K 3.5 4.0  CL 108 103  CO2 27 28  GLUCOSE 88 118*  BUN 18 13  CREATININE 0.79 0.60  CALCIUM 8.9 8.9   Liver Function Tests: Recent Labs  Lab 08/05/19 1245 08/06/19 0127  AST 14* 32  ALT 24 33  ALKPHOS 61 78  BILITOT 0.7 0.9  PROT 6.5 6.9  ALBUMIN 3.6 4.0   Recent Labs  Lab 08/05/19 1245 08/06/19 0127  LIPASE 366* 102*   CBC: Recent Labs  Lab 08/05/19 1245 08/06/19 0127  WBC 11.7* 12.5*  NEUTROABS  --  10.1*  HGB 13.4 13.5  HCT 41.4 41.2  MCV 88.1 86.9  PLT 253 244     Recent Results (from the past 240 hour(s))  SARS Coronavirus 2 [facility] order, Performed in [facility] hospital lab) Nasopharyngeal Nasopharyngeal Swab     Status: None   Collection Time: 08/06/19  1:27 AM   Specimen: Nasopharyngeal Swab  Result Value Ref Range Status   SARS Coronavirus 2 NEGATIVE NEGATIVE Final    Comment: (NOTE) If result is NEGATIVE SARS-CoV-2 target nucleic acids are NOT DETECTED. The SARS-CoV-2 RNA is generally detectable in upper and lower  respiratory specimens during the acute phase of infection. The lowest  concentration of SARS-CoV-2 viral copies this assay can detect is 250  copies / mL. A negative result does not  preclude SARS-CoV-2 infection  and should not be used as the sole basis for treatment or other  patient management decisions.  A negative result may occur with  improper specimen collection / handling, submission of specimen other  than nasopharyngeal swab, presence of viral mutation(s) within the  areas targeted by this assay, and inadequate number of viral copies  (<250 copies / mL). A negative result must be combined with clinical  observations, patient history, and epidemiological information. If result is POSITIVE SARS-CoV-2 target nucleic acids are DETECTED. The SARS-CoV-2 RNA is generally detectable in upper and lower  respiratory specimens dur ing the acute phase of infection.  Positive  results are indicative of active infection with SARS-CoV-2.  Clinical  correlation with patient history and other diagnostic information is  necessary to determine patient infection status.  Positive results do  not rule out bacterial infection or co-infection with other viruses. If result is PRESUMPTIVE POSTIVE SARS-CoV-2 nucleic acids MAY BE PRESENT.   A presumptive positive result was obtained on the submitted specimen  and confirmed on repeat testing.  While 2019 novel coronavirus  (SARS-CoV-2) nucleic acids may be present in the submitted sample  additional confirmatory testing may be necessary for epidemiological  and / or clinical management purposes  to differentiate between  SARS-CoV-2 and other Sarbecovirus currently known to infect humans.  If clinically indicated additional testing with an alternate test  methodology [phone]) is advised. The SARS-CoV-2 RNA is generally  detectable in upper and lower respiratory sp ecimens during the acute  phase of infection. The expected result is Negative. Fact Sheet for Patients:  StrictlyIdeas.no Fact Sheet for Healthcare Providers: BankingDealers.co.za This test is not yet approved or cleared by  the [place] FDA and has been authorized for detection and/or diagnosis of SARS-CoV-2 by FDA under an Emergency Use Authorization (EUA).  This EUA will remain in effect (meaning this test can be used) for the duration of the COVID-19 declaration under Section 564(b)(1) of the Act, 21 U.S.C. section 360bbb-3(b)(1), unless the authorization is terminated or revoked sooner. Performed at [facility], [address], [place], [place] [address]      Studies: Mr 3d Recon At Scanner  Result Date: 08/06/2019 CLINICAL DATA:  48 year old female with history of acute pancreatitis (first episode). Abdominal pain. EXAM: MRI ABDOMEN WITHOUT AND WITH CONTRAST (INCLUDING MRCP) TECHNIQUE: Multiplanar multisequence MR imaging of the abdomen was performed both before and after the administration of intravenous contrast. Heavily T2-weighted images of the biliary and pancreatic ducts were obtained, and three-dimensional MRCP images were rendered by post processing. CONTRAST:  5 mL of Gadavist. COMPARISON:  No priors. FINDINGS: Lower chest: Unremarkable. Hepatobiliary: No suspicious cystic or solid hepatic lesions. MRCP images demonstrate no intra or extrahepatic biliary ductal dilatation. Common  bile duct is upper limits of normal measuring 6 mm in the porta hepatis. No filling defects in the common bile duct to suggest choledocholithiasis. Numerous filling defects within the gallbladder compatible with gallstones. Gallbladder does not appear distended. No pericholecystic fluid or surrounding inflammatory changes to suggest an acute cholecystitis at this time. Pancreas: No pancreatic mass. No pancreatic ductal dilatation noted on MRCP images. No pancreatic or peripancreatic fluid collections or inflammatory changes. Pancreatic parenchyma enhances normally. Spleen:  Unremarkable. Adrenals/Urinary Tract: Bilateral kidneys and adrenal glands are normal in appearance. No hydroureteronephrosis in the visualized  portions of the abdomen. Stomach/Bowel: Normal appearance of the stomach. Duodenal diverticulum extending cephalad from the third portion of the duodenum incidentally noted. No surrounding inflammatory changes. Remaining visualized portions of small bowel and colon are grossly unremarkable in appearance. Vascular/Lymphatic: Aortic atherosclerosis, without evidence of aneurysm in the abdominal vasculature. Retroaortic left renal vein (normal anatomical variant) incidentally noted. No lymphadenopathy noted in the abdomen. Other: No significant volume of ascites noted in the visualized portions of the peritoneal cavity. Musculoskeletal: No aggressive appearing osseous lesions are noted in the visualized portions of the skeleton. IMPRESSION: 1. No imaging findings to suggest complicated pancreatitis. 2. No choledocholithiasis or other findings to explain the patient's recent history of acute pancreatitis. 3. Cholelithiasis without evidence of acute cholecystitis. 4. 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 Electronically Signed   By: Vinnie Langton M.D.   On: 08/06/2019 08:11   Mr Abdomen Mrcp Moise Boring Contast  Result Date: 08/06/2019 CLINICAL DATA:  49 year old female with history of acute pancreatitis (first episode). Abdominal pain. EXAM: MRI ABDOMEN WITHOUT AND WITH CONTRAST (INCLUDING MRCP) TECHNIQUE: Multiplanar multisequence MR imaging of the abdomen was performed both before and after the administration of intravenous contrast. Heavily T2-weighted images of the biliary and pancreatic ducts were obtained, and three-dimensional MRCP images were rendered by post processing. CONTRAST:  5 mL of Gadavist. COMPARISON:  No priors. FINDINGS: Lower chest: Unremarkable. Hepatobiliary: No suspicious cystic or solid hepatic lesions. MRCP images demonstrate no intra or extrahepatic biliary ductal dilatation. Common bile duct is upper limits of normal measuring 6 mm in the porta  hepatis. No filling defects in the common bile duct to suggest choledocholithiasis. Numerous filling defects within the gallbladder compatible with gallstones. Gallbladder does not appear distended. No pericholecystic fluid or surrounding inflammatory changes to suggest an acute cholecystitis at this time. Pancreas: No pancreatic mass. No pancreatic ductal dilatation noted on MRCP images. No pancreatic or peripancreatic fluid collections or inflammatory changes. Pancreatic parenchyma enhances normally. Spleen:  Unremarkable. Adrenals/Urinary Tract: Bilateral kidneys and adrenal glands are normal in appearance. No hydroureteronephrosis in the visualized portions of the abdomen. Stomach/Bowel: Normal appearance of the stomach. Duodenal diverticulum extending cephalad from the third portion of the duodenum incidentally noted. No surrounding inflammatory changes. Remaining visualized portions of small bowel and colon are grossly unremarkable in appearance. Vascular/Lymphatic: Aortic atherosclerosis, without evidence of aneurysm in the abdominal vasculature. Retroaortic left renal vein (normal anatomical variant) incidentally noted. No lymphadenopathy noted in the abdomen. Other: No significant volume of ascites noted in the visualized portions of the peritoneal cavity. Musculoskeletal: No aggressive appearing osseous lesions are noted in the visualized portions of the skeleton. IMPRESSION: 1. No imaging findings to suggest complicated pancreatitis. 2. No choledocholithiasis or other findings to explain the patient's recent history of acute pancreatitis. 3. Cholelithiasis without evidence of acute cholecystitis. 4. 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 Electronically  Signed   By: Vinnie Langton M.D.   On: 08/06/2019 08:11   US Abdomen Limited Ruq  Result Date: 08/05/2019 CLINICAL DATA:  48 year old female with epigastric pain. EXAM: ULTRASOUND ABDOMEN LIMITED RIGHT  UPPER QUADRANT COMPARISON:  Renal ultrasound dated 12/28/2015 and CT of the abdomen pelvis dated 01/11/2016 FINDINGS: Gallbladder: The gallbladder is filled with stones. No gallbladder wall thickening or pericholecystic fluid. Negative sonographic Murphy's sign. Common bile duct: Diameter: 3 mm Liver: The liver is unremarkable. Portal vein is patent on color Doppler imaging with normal direction of blood flow towards the liver. Other: None. IMPRESSION: Cholelithiasis without sonographic evidence of acute cholecystitis. Electronically Signed   By: Anner Crete M.D.   On: 08/05/2019 17:08    Scheduled Meds: . pantoprazole (PROTONIX) IV  40 mg Intravenous Q12H   Continuous Infusions: . sodium chloride 100 mL/hr at 08/06/19 0428    Assessment/Plan:  1. Right upper quadrant abdominal pain.  Imaging of the gallbladder shows gallstones but no evidence of acute cholecystitis.  Liver function tests are normal except for the lipase which is a little bit elevated.  I do not think this is a pancreatitis.  General surgery consultation for evaluation of the gallbladder.  Patient is on IV Protonix.  Hold antibiotic and steroids at this time. 2. Elevated blood pressure likely secondary to pain.  Continue to monitor  Code Status:     Code Status Orders  (From admission, onward)         Start     Ordered   08/06/19 0414  Full code  Continuous     08/06/19 0413        Code Status History    Date Active Date Inactive Code Status Order ID Comments User Context   04/15/2015 1240 04/17/2015 0008 Full Code [id number]  Schermerhorn, Gwen Her, MD Inpatient   Advance Care Planning Activity     Disposition Plan: To be determined  Consultants:  Gastroenterology  General surgery  Time spent: 36 minutes  [place]

## 2019-08-06 NOTE — Plan of Care (Signed)
  Problem: Education: Goal: Knowledge of General Education information will improve Description: Including pain rating scale, medication(s)/side effects and non-pharmacologic comfort measures Outcome: Progressing   Problem: Pain Managment: Goal: General experience of comfort will improve Outcome: Progressing   

## 2019-08-07 ENCOUNTER — Encounter: Payer: Self-pay | Admitting: *Deleted

## 2019-08-07 ENCOUNTER — Encounter
Admission: EM | Disposition: A | Discharge status: Home / Self Care | Payer: Self-pay | Source: Home / Self Care | Attending: Internal Medicine

## 2019-08-07 ENCOUNTER — Inpatient Hospital Stay: Payer: Federal, State, Local not specified - PPO | Admitting: Anesthesiology

## 2019-08-07 HISTORY — PX: CHOLECYSTECTOMY: SHX55

## 2019-08-07 SURGERY — LAPAROSCOPIC CHOLECYSTECTOMY
Anesthesia: General

## 2019-08-07 MED ORDER — ACETAMINOPHEN 10 MG/ML IV SOLN
INTRAVENOUS | Status: AC
  Filled 2019-08-07: qty 100

## 2019-08-07 MED ORDER — KETOROLAC TROMETHAMINE 30 MG/ML IJ SOLN
INTRAMUSCULAR | Status: DC | PRN
  Administered 2019-08-07: 30 mg via INTRAVENOUS

## 2019-08-07 MED ORDER — CEFAZOLIN SODIUM-DEXTROSE 2-4 GM/100ML-% IV SOLN
INTRAVENOUS | Status: AC
  Filled 2019-08-07: qty 100

## 2019-08-07 MED ORDER — MORPHINE SULFATE (PF) 4 MG/ML IV SOLN
INTRAVENOUS | Status: AC
  Administered 2019-08-07: 2 mg via INTRAVENOUS
  Filled 2019-08-07: qty 1

## 2019-08-07 MED ORDER — PROPOFOL 10 MG/ML IV BOLUS
INTRAVENOUS | Status: AC
  Filled 2019-08-07: qty 20

## 2019-08-07 MED ORDER — ROCURONIUM BROMIDE 100 MG/10ML IV SOLN
INTRAVENOUS | Status: DC | PRN
  Administered 2019-08-07: 30 mg via INTRAVENOUS

## 2019-08-07 MED ORDER — FENTANYL CITRATE (PF) 100 MCG/2ML IJ SOLN
INTRAMUSCULAR | Status: AC
  Filled 2019-08-07: qty 2

## 2019-08-07 MED ORDER — DEXAMETHASONE SODIUM PHOSPHATE 10 MG/ML IJ SOLN
INTRAMUSCULAR | Status: DC | PRN
  Administered 2019-08-07: 10 mg via INTRAVENOUS

## 2019-08-07 MED ORDER — ROCURONIUM BROMIDE 50 MG/5ML IV SOLN
INTRAVENOUS | Status: AC
  Filled 2019-08-07: qty 1

## 2019-08-07 MED ORDER — PROMETHAZINE HCL 25 MG/ML IJ SOLN: 6.2500 mg | INTRAMUSCULAR | Status: DC | PRN

## 2019-08-07 MED ORDER — SUGAMMADEX SODIUM 200 MG/2ML IV SOLN
INTRAVENOUS | Status: DC | PRN
  Administered 2019-08-07: 150 mg via INTRAVENOUS

## 2019-08-07 MED ORDER — BUPIVACAINE-EPINEPHRINE (PF) 0.5% -1:200000 IJ SOLN
INTRAMUSCULAR | Status: DC | PRN
  Administered 2019-08-07: 14 mL

## 2019-08-07 MED ORDER — LIDOCAINE HCL (CARDIAC) PF 100 MG/5ML IV SOSY
PREFILLED_SYRINGE | INTRAVENOUS | Status: DC | PRN
  Administered 2019-08-07: 50 mg via INTRAVENOUS

## 2019-08-07 MED ORDER — HYDROMORPHONE HCL 1 MG/ML IJ SOLN
0.5000 mg | INTRAMUSCULAR | Status: AC | PRN
  Administered 2019-08-07 (×4): 0.5 mg via INTRAVENOUS

## 2019-08-07 MED ORDER — MIDAZOLAM HCL 2 MG/2ML IJ SOLN
INTRAMUSCULAR | Status: DC | PRN
  Administered 2019-08-07: 2 mg via INTRAVENOUS

## 2019-08-07 MED ORDER — FENTANYL CITRATE (PF) 100 MCG/2ML IJ SOLN
INTRAMUSCULAR | Status: DC | PRN
  Administered 2019-08-07: 100 ug via INTRAVENOUS

## 2019-08-07 MED ORDER — DEXAMETHASONE SODIUM PHOSPHATE 10 MG/ML IJ SOLN
INTRAMUSCULAR | Status: AC
  Filled 2019-08-07: qty 1

## 2019-08-07 MED ORDER — HYDROMORPHONE HCL 1 MG/ML IJ SOLN
INTRAMUSCULAR | Status: AC
  Administered 2019-08-07: 0.5 mg via INTRAVENOUS
  Filled 2019-08-07: qty 1

## 2019-08-07 MED ORDER — MORPHINE SULFATE (PF) 4 MG/ML IV SOLN
2.0000 mg | INTRAVENOUS | Status: DC | PRN
  Administered 2019-08-07 (×4): 2 mg via INTRAVENOUS

## 2019-08-07 MED ORDER — MORPHINE SULFATE (PF) 4 MG/ML IV SOLN
INTRAVENOUS | Status: AC
  Administered 2019-08-07: 15:00:00 2 mg via INTRAVENOUS
  Filled 2019-08-07: qty 1

## 2019-08-07 MED ORDER — HYDROMORPHONE HCL 1 MG/ML IJ SOLN
INTRAMUSCULAR | Status: AC
  Administered 2019-08-07: 16:00:00 0.5 mg via INTRAVENOUS
  Filled 2019-08-07: qty 1

## 2019-08-07 MED ORDER — MIDAZOLAM HCL 2 MG/2ML IJ SOLN
INTRAMUSCULAR | Status: AC
  Filled 2019-08-07: qty 2

## 2019-08-07 MED ORDER — PROPOFOL 10 MG/ML IV BOLUS
INTRAVENOUS | Status: DC | PRN
  Administered 2019-08-07: 100 mg via INTRAVENOUS

## 2019-08-07 MED ORDER — ONDANSETRON HCL 4 MG/2ML IJ SOLN
INTRAMUSCULAR | Status: AC
  Filled 2019-08-07: qty 2

## 2019-08-07 MED ORDER — CEFAZOLIN SODIUM-DEXTROSE 2-4 GM/100ML-% IV SOLN
2.0000 g | Freq: Once | INTRAVENOUS | Status: AC
  Administered 2019-08-07: 2 g via INTRAVENOUS

## 2019-08-07 MED ORDER — BUPIVACAINE-EPINEPHRINE (PF) 0.5% -1:200000 IJ SOLN
INTRAMUSCULAR | Status: AC
  Filled 2019-08-07: qty 30

## 2019-08-07 MED ORDER — ONDANSETRON HCL 4 MG/2ML IJ SOLN
INTRAMUSCULAR | Status: DC | PRN
  Administered 2019-08-07: 4 mg via INTRAVENOUS

## 2019-08-07 MED ORDER — LIDOCAINE HCL (PF) 2 % IJ SOLN
INTRAMUSCULAR | Status: AC
  Filled 2019-08-07: qty 10

## 2019-08-07 MED ORDER — ACETAMINOPHEN 10 MG/ML IV SOLN
INTRAVENOUS | Status: DC | PRN
  Administered 2019-08-07: 1000 mg via INTRAVENOUS

## 2019-08-07 SURGICAL SUPPLY — 40 items
APPLIER CLIP 5 13 M/L LIGAMAX5 (MISCELLANEOUS) ×3
BLADE SURG SZ11 CARB STEEL (BLADE) ×3 IMPLANT
CANISTER SUCT 1200ML W/VALVE (MISCELLANEOUS) ×3 IMPLANT
CATH CHOLANG 76X19 KUMAR (CATHETERS) ×3 IMPLANT
CHLORAPREP W/TINT 26 (MISCELLANEOUS) ×3 IMPLANT
CLIP APPLIE 5 13 M/L LIGAMAX5 (MISCELLANEOUS) ×1 IMPLANT
COVER WAND RF STERILE (DRAPES) ×1 IMPLANT
DERMABOND ADVANCED (GAUZE/BANDAGES/DRESSINGS) ×2
DERMABOND ADVANCED .7 DNX12 (GAUZE/BANDAGES/DRESSINGS) ×1 IMPLANT
ELECT REM PT RETURN 9FT ADLT (ELECTROSURGICAL) ×3
ELECTRODE REM PT RTRN 9FT ADLT (ELECTROSURGICAL) ×1 IMPLANT
GLOVE BIO SURGEON STRL SZ 6.5 (GLOVE) ×2 IMPLANT
GLOVE BIO SURGEONS STRL SZ 6.5 (GLOVE) ×1
GLOVE BIOGEL PI IND STRL 6.5 (GLOVE) ×1 IMPLANT
GLOVE BIOGEL PI INDICATOR 6.5 (GLOVE) ×2
GOWN STRL REUS W/ TWL LRG LVL3 (GOWN DISPOSABLE) ×4 IMPLANT
GOWN STRL REUS W/TWL LRG LVL3 (GOWN DISPOSABLE) ×8
GRASPER SUT TROCAR 14GX15 (MISCELLANEOUS) ×2 IMPLANT
HEMOSTAT SURGICEL 2X3 (HEMOSTASIS) IMPLANT
IRRIGATION STRYKERFLOW (MISCELLANEOUS) ×1 IMPLANT
IRRIGATOR STRYKERFLOW (MISCELLANEOUS) ×3
IV NS 1000ML (IV SOLUTION) ×2
IV NS 1000ML BAXH (IV SOLUTION) ×1 IMPLANT
KIT TURNOVER KIT A (KITS) ×3 IMPLANT
LABEL OR SOLS (LABEL) ×3 IMPLANT
NDL HYPO 25X1 1.5 SAFETY (NEEDLE) ×1 IMPLANT
NDL INSUFFLATION 14GA 120MM (NEEDLE) ×1 IMPLANT
NEEDLE HYPO 25X1 1.5 SAFETY (NEEDLE) ×3 IMPLANT
NEEDLE INSUFFLATION 14GA 120MM (NEEDLE) ×3 IMPLANT
NS IRRIG 500ML POUR BTL (IV SOLUTION) ×3 IMPLANT
PACK LAP CHOLECYSTECTOMY (MISCELLANEOUS) ×3 IMPLANT
POUCH SPECIMEN RETRIEVAL 10MM (ENDOMECHANICALS) ×3 IMPLANT
SCISSORS METZENBAUM CVD 33 (INSTRUMENTS) ×3 IMPLANT
SET TUBE SMOKE EVAC HIGH FLOW (TUBING) ×3 IMPLANT
SLEEVE ENDOPATH XCEL 5M (ENDOMECHANICALS) ×6 IMPLANT
SUT MNCRL AB 4-0 PS2 18 (SUTURE) ×3 IMPLANT
SUT VIC AB 0 CT1 36 (SUTURE) IMPLANT
SUT VICRYL 0 AB UR-6 (SUTURE) ×3 IMPLANT
TROCAR XCEL NON-BLD 11X100MML (ENDOMECHANICALS) ×3 IMPLANT
TROCAR XCEL NON-BLD 5MMX100MML (ENDOMECHANICALS) ×3 IMPLANT

## 2019-08-07 NOTE — Interval H&P Note (Signed)
History and Physical Interval Note:  08/07/2019 12:48 PM  Gail [name]  has presented today for surgery, with the diagnosis of gallstone pancreatitis.  The various methods of treatment have been discussed with the patient and family. After consideration of risks, benefits and other options for treatment, the patient has consented to  Procedure(s): LAPAROSCOPIC CHOLECYSTECTOMY (N/A) as a surgical intervention.  The patient's history has been reviewed, patient examined, no change in status, stable for surgery.  I have reviewed the patient's chart and labs.  Questions were answered to the patient's satisfaction.     Herbert Pun

## 2019-08-07 NOTE — Anesthesia Procedure Notes (Signed)
Procedure Name: Intubation Date/Time: 08/07/2019 1:23 PM Performed by: Jonna Clark, CRNA Pre-anesthesia Checklist: Patient identified, Patient being monitored, Timeout performed, Emergency Drugs available and Suction available Patient Re-evaluated:Patient Re-evaluated prior to induction Oxygen Delivery Method: Circle system utilized Preoxygenation: Pre-oxygenation with 100% oxygen Induction Type: IV induction Ventilation: Mask ventilation without difficulty Laryngoscope Size: Miller and 2 Grade View: Grade I Tube type: Oral Tube size: 7.0 mm Number of attempts: 1 Airway Equipment and Method: Stylet Placement Confirmation: ETT inserted through vocal cords under direct vision,  positive ETCO2 and breath sounds checked- equal and bilateral Secured at: 21 cm Tube secured with: Tape Dental Injury: Teeth and Oropharynx as per pre-operative assessment

## 2019-08-07 NOTE — Anesthesia Post-op Follow-up Note (Signed)
Anesthesia QCDR form completed.        

## 2019-08-07 NOTE — Transfer of Care (Signed)
Immediate Anesthesia Transfer of Care Note  Patient: Gail [name]  Procedure(s) Performed: LAPAROSCOPIC CHOLECYSTECTOMY (N/A )  Patient Location: PACU  Anesthesia Type:General  Level of Consciousness: awake and alert   Airway & Oxygen Therapy: Patient Spontanous Breathing and Patient connected to face mask oxygen  Post-op Assessment: Report given to RN and Post -op Vital signs reviewed and stable  Post vital signs: Reviewed and stable  Last Vitals:  Vitals Value Taken Time  BP 149/110 08/07/19 1444  Temp 36.4 C 08/07/19 1441  Pulse 65 08/07/19 1444  Resp 16 08/07/19 1444  SpO2 100 % 08/07/19 1444  Vitals shown include unvalidated device data.  Last Pain:  Vitals:   08/07/19 1233  TempSrc: Temporal  PainSc: 0-No pain      Patients Stated Pain Goal: 0 ([id number])  Complications: No apparent anesthesia complications

## 2019-08-07 NOTE — Op Note (Signed)
Preoperative diagnosis: Biliary pancreatitis.  Postoperative diagnosis: Biliary pancreatitis.  Procedure: Laparoscopic Cholecystectomy.   Anesthesia: GETA   Surgeon: Dr. Windell Moment  Wound Classification: Clean Contaminated  Indications: Patient is a 48 y.o. female developed right upper quadrant pain, nausea and vomiting and on workup was found to have cholelithiasis with a normal common duct and resolved biliary pancreatitis. MRCP nevative for common bile duct stones. Laparoscopic cholecystectomy was elected.  Findings: Acutely inflamed gallbladder.  Critical view of safety achieved Cystic duct and artery identified, ligated and divided Adequate hemostasis  Description of procedure: The patient was placed on the operating table in the supine position. General anesthesia was induced. A time-out was completed verifying correct patient, procedure, site, positioning, and implant(s) and/or special equipment prior to beginning this procedure. An orogastric tube was placed. The abdomen was prepped and draped in the usual sterile fashion.  An incision was made in a natural skin line above the umbilicus.  The fascia was elevated and the Veress needle inserted. Proper position was confirmed by aspiration and saline meniscus test.  The abdomen was insufflated with carbon dioxide to a pressure of 15 mmHg. The patient tolerated insufflation well. A 11-mm trocar was then inserted.  The laparoscope was inserted and the abdomen inspected. No injuries from initial trocar placement were noted. Additional trocars were then inserted in the following locations: a 5-mm trocar in the right epigastrium and two 5-mm trocars along the right costal margin. The abdomen was inspected and no abnormalities were found. The table was placed in the reverse Trendelenburg position with the right side up.  Filmy adhesions between the gallbladder and omentum, duodenum and transverse colon were lysed sharply. The dome of the  gallbladder was grasped with an atraumatic grasper passed through the lateral port and retracted over the dome of the liver. The infundibulum was also grasped with an atraumatic grasper through the midclavicular port and retracted toward the right lower quadrant. This maneuver exposed Calot's triangle. The peritoneum overlying the gallbladder infundibulum was then incised and the cystic duct and cystic artery identified and circumferentially dissected. Critical view of safety reviewed before ligating any structure. The cystic duct and cystic artery were then doubly clipped and divided close to the gallbladder.  The gallbladder was then dissected from its peritoneal attachments by electrocautery. Hemostasis was checked and the gallbladder and contained stones were removed using an endoscopic retrieval bag placed through the umbilical port. The gallbladder was passed off the table as a specimen. The gallbladder fossa was copiously irrigated with saline and hemostasis was obtained. There was no evidence of bleeding from the gallbladder fossa or cystic artery or leakage of the bile from the cystic duct stump. Secondary trocars were removed under direct vision. No bleeding was noted. The laparoscope was withdrawn and the umbilical trocar removed. The abdomen was allowed to collapse. The fascia of the 30mm trocar sites was closed with figure-of-eight 0 vicryl sutures. The skin was closed with subcuticular sutures of 4-0 monocryl and topical skin adhesive. The orogastric tube was removed.  The patient tolerated the procedure well and was taken to the postanesthesia care unit in stable condition.   Specimen: Gallbladder  Complications: None  EBL: 5 mL

## 2019-08-07 NOTE — Anesthesia Preprocedure Evaluation (Addendum)
Anesthesia Evaluation  Patient identified by MRN, date of birth, ID band Patient awake    Reviewed: Allergy & Precautions, H&P , NPO status , Patient's Chart, lab work & pertinent test results  History of Anesthesia Complications (+) PONV and history of anesthetic complications  Airway Mallampati: II  TM Distance: >3 FB Neck ROM: full    Dental   Crowns:   Pulmonary neg pulmonary ROS, neg shortness of breath, neg COPD, neg recent URI,           Cardiovascular (-) angina(-) Past MI and (-) Cardiac Stents negative cardio ROS  (-) dysrhythmias      Neuro/Psych negative neurological ROS  negative psych ROS   GI/Hepatic negative GI ROS, Neg liver ROS,   Endo/Other  negative endocrine ROS  Renal/GU stones     Musculoskeletal   Abdominal   Peds  Hematology  (+) Blood dyscrasia, anemia ,   Anesthesia Other Findings Past Medical History: 10/08/2013: Abdominal pain     Comment:  Last Assessment & Plan:  Abdominal pain seems               multifactorial.  There is a component of pelvic floor               myalgia on exam.  Her symptoms are also consistent with               possible acute GI viral illness.  No evidence of acute               emergent pathology to explain pain.  Advised supportive               measures for GI symptoms and pain control with oral               medications.  As noted in separate problem, renal stone               may also be contributing.  Will follow up in 1-2 weeks to              assess progress of symptoms.    03/24/2015: Abnormal uterine bleeding     Comment:  Last Assessment & Plan:  EMB pathology results still               pending.  Plan to follow up with Dr Tressie Ellis as previously               planned once results are available.  No bleeding concerns              today.    No date: Anemia 01/25/2015: Calculus of kidney     Comment:  Last Assessment & Plan:  While her abdominal pain                symptoms are not entirely consistent with the presence of              her kidney stone, it is a possible contributor.  Rx               provided for motrin and tamulosin to aid with passage of               stone.    04/06/2015: Chronic pelvic pain in female No date: Complication of anesthesia     Comment:  n/v 04/06/2015: Dermoid cyst of ovary     Comment:  right  10/08/2013: Episode of syncope No date: PONV (postoperative  nausea and vomiting)  Past Surgical History: No date: ABDOMINAL HYSTERECTOMY No date: CESAREAN SECTION 04/15/2015: CYSTOSCOPY; N/A     Comment:  Procedure: CYSTOSCOPY;  Surgeon: Boykin Nearing,               MD;  Location: [facility];  Service: Gynecology;                Laterality: N/A; 12/28/2015: CYSTOSCOPY WITH RETROGRADE PYELOGRAM, URETEROSCOPY AND  STENT PLACEMENT; Left     Comment:  Procedure: [place],               URETEROSCOPY AND STENT PLACEMENT;  Surgeon: Festus Aloe, MD;  Location: [facility];  Service: Urology;                Laterality: Left; 01/18/2016: CYSTOSCOPY WITH STENT PLACEMENT; Left     Comment:  Procedure: CYSTOSCOPY WITH STENT REMOVAL;  Surgeon:               Nickie Retort, MD;  Location: [facility];  Service:               Urology;  Laterality: Left; No date: HAND TENODESIS 04/15/2015: LAPAROSCOPIC ASSISTED VAGINAL HYSTERECTOMY; N/A     Comment:  Procedure: LAPAROSCOPIC ASSISTED VAGINAL HYSTERECTOMY;                Surgeon: Boykin Nearing, MD;  Location: [facility];               Service: Gynecology;  Laterality: N/A; 04/15/2015: LAPAROSCOPIC SALPINGO OOPHERECTOMY; Bilateral     Comment:  Procedure: LAPAROSCOPIC SALPINGO OOPHORECTOMY;  Surgeon:              Boykin Nearing, MD;  Location: [facility];  Service:              Gynecology;  Laterality: Bilateral; 01/18/2016: URETEROSCOPY WITH HOLMIUM LASER LITHOTRIPSY; Left     Comment:  Procedure: URETEROSCOPY WITH  STONE EXTRACTION;  Surgeon:              Nickie Retort, MD;  Location: [facility];  Service:               Urology;  Laterality: Left;  BMI    Body Mass Index: 21.25 kg/m      Reproductive/Obstetrics negative OB ROS                            Anesthesia Physical Anesthesia Plan  ASA: II  Anesthesia Plan: General ETT   Post-op Pain Management:    Induction:   PONV Risk Score and Plan: Ondansetron, Dexamethasone, Midazolam and Treatment may vary due to age or medical condition  Airway Management Planned:   Additional Equipment:   Intra-op Plan:   Post-operative Plan:   Informed Consent: I have reviewed the patients History and Physical, chart, labs and discussed the procedure including the risks, benefits and alternatives for the proposed anesthesia with the patient or authorized representative who has indicated his/her understanding and acceptance.     Dental Advisory Given  Plan Discussed with: Anesthesiologist and CRNA  Anesthesia Plan Comments:         Anesthesia Quick Evaluation

## 2019-08-07 NOTE — Progress Notes (Signed)
Vonda Antigua, MD [address], [place], [place], [place], [address] [address], [place], [place], [place], [address] Phone: [phone]  Fax: [phone]   Subjective:  Patient is status post cholecystectomy and doing well.  Objective: Exam: Vital signs in last 24 hours: Vitals:   08/07/19 1556 08/07/19 1611 08/07/19 1614 08/07/19 1626  BP: 113/73 108/67  106/71  Pulse: 65 66 63 70  Resp: 11 12 14 14   Temp:  98.1 F (36.7 C)    TempSrc:      SpO2: 91% 90% 98% 97%  Weight:      Height:       Weight change:   Intake/Output Summary (Last 24 hours) at 08/07/2019 1702 Last data filed at 08/07/2019 1622 Gross per 24 hour  Intake 3328.26 ml  Output 2810 ml  Net 518.26 ml    General: No acute distress, AAO x3 Abd: Soft, NT/ND, No HSM Skin: Warm, no rashes Neck: Supple, Trachea midline   Lab Results: Lab Results  Component Value Date   WBC 12.5 (H) 08/06/2019   HGB 13.5 08/06/2019   HCT 41.2 08/06/2019   MCV 86.9 08/06/2019   PLT 244 08/06/2019   Micro Results: Recent Results (from the past 240 hour(s))  SARS Coronavirus 2 [facility] order, Performed in [facility] hospital lab) Nasopharyngeal Nasopharyngeal Swab     Status: None   Collection Time: 08/06/19  1:27 AM   Specimen: Nasopharyngeal Swab  Result Value Ref Range Status   SARS Coronavirus 2 NEGATIVE NEGATIVE Final    Comment: (NOTE) If result is NEGATIVE SARS-CoV-2 target nucleic acids are NOT DETECTED. The SARS-CoV-2 RNA is generally detectable in upper and lower  respiratory specimens during the acute phase of infection. The lowest  concentration of SARS-CoV-2 viral copies this assay can detect is 250  copies / mL. A negative result does not preclude SARS-CoV-2 infection  and should not be used as the sole basis for treatment or other  patient management decisions.  A negative result may occur with  improper specimen collection / handling, submission of specimen other  than  nasopharyngeal swab, presence of viral mutation(s) within the  areas targeted by this assay, and inadequate number of viral copies  (<250 copies / mL). A negative result must be combined with clinical  observations, patient history, and epidemiological information. If result is POSITIVE SARS-CoV-2 target nucleic acids are DETECTED. The SARS-CoV-2 RNA is generally detectable in upper and lower  respiratory specimens dur ing the acute phase of infection.  Positive  results are indicative of active infection with SARS-CoV-2.  Clinical  correlation with patient history and other diagnostic information is  necessary to determine patient infection status.  Positive results do  not rule out bacterial infection or co-infection with other viruses. If result is PRESUMPTIVE POSTIVE SARS-CoV-2 nucleic acids MAY BE PRESENT.   A presumptive positive result was obtained on the submitted specimen  and confirmed on repeat testing.  While 2019 novel coronavirus  (SARS-CoV-2) nucleic acids may be present in the submitted sample  additional confirmatory testing may be necessary for epidemiological  and / or clinical management purposes  to differentiate between  SARS-CoV-2 and other Sarbecovirus currently known to infect humans.  If clinically indicated additional testing with an alternate test  methodology [phone]) is advised. The SARS-CoV-2 RNA is generally  detectable in upper and lower respiratory sp ecimens during the acute  phase of infection. The expected result is Negative. Fact Sheet for Patients:  StrictlyIdeas.no Fact Sheet  for Healthcare Providers: BankingDealers.co.za This test is not yet approved or cleared by the [place] and has been authorized for detection and/or diagnosis of SARS-CoV-2 by FDA under an Emergency Use Authorization (EUA).  This EUA will remain in effect (meaning this test can be used) for the duration of  the COVID-19 declaration under Section 564(b)(1) of the Act, 21 U.S.C. section 360bbb-3(b)(1), unless the authorization is terminated or revoked sooner. Performed at [facility], [place], [place] [address]    Studies/Results: Mr 3d Recon At Scanner  Result Date: 08/06/2019 CLINICAL DATA:  48 year old female with history of acute pancreatitis (first episode). Abdominal pain. EXAM: MRI ABDOMEN WITHOUT AND WITH CONTRAST (INCLUDING MRCP) TECHNIQUE: Multiplanar multisequence MR imaging of the abdomen was performed both before and after the administration of intravenous contrast. Heavily T2-weighted images of the biliary and pancreatic ducts were obtained, and three-dimensional MRCP images were rendered by post processing. CONTRAST:  5 mL of Gadavist. COMPARISON:  No priors. FINDINGS: Lower chest: Unremarkable. Hepatobiliary: No suspicious cystic or solid hepatic lesions. MRCP images demonstrate no intra or extrahepatic biliary ductal dilatation. Common bile duct is upper limits of normal measuring 6 mm in the porta hepatis. No filling defects in the common bile duct to suggest choledocholithiasis. Numerous filling defects within the gallbladder compatible with gallstones. Gallbladder does not appear distended. No pericholecystic fluid or surrounding inflammatory changes to suggest an acute cholecystitis at this time. Pancreas: No pancreatic mass. No pancreatic ductal dilatation noted on MRCP images. No pancreatic or peripancreatic fluid collections or inflammatory changes. Pancreatic parenchyma enhances normally. Spleen:  Unremarkable. Adrenals/Urinary Tract: Bilateral kidneys and adrenal glands are normal in appearance. No hydroureteronephrosis in the visualized portions of the abdomen. Stomach/Bowel: Normal appearance of the stomach. Duodenal diverticulum extending cephalad from the third portion of the duodenum incidentally noted. No surrounding inflammatory changes. Remaining  visualized portions of small bowel and colon are grossly unremarkable in appearance. Vascular/Lymphatic: Aortic atherosclerosis, without evidence of aneurysm in the abdominal vasculature. Retroaortic left renal vein (normal anatomical variant) incidentally noted. No lymphadenopathy noted in the abdomen. Other: No significant volume of ascites noted in the visualized portions of the peritoneal cavity. Musculoskeletal: No aggressive appearing osseous lesions are noted in the visualized portions of the skeleton. IMPRESSION: 1. No imaging findings to suggest complicated pancreatitis. 2. No choledocholithiasis or other findings to explain the patient's recent history of acute pancreatitis. 3. Cholelithiasis without evidence of acute cholecystitis. 4. 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 Electronically Signed   By: Vinnie Langton M.D.   On: 08/06/2019 08:11   Mr Abdomen Mrcp Moise Boring Contast  Result Date: 08/06/2019 CLINICAL DATA:  48 year old female with history of acute pancreatitis (first episode). Abdominal pain. EXAM: MRI ABDOMEN WITHOUT AND WITH CONTRAST (INCLUDING MRCP) TECHNIQUE: Multiplanar multisequence MR imaging of the abdomen was performed both before and after the administration of intravenous contrast. Heavily T2-weighted images of the biliary and pancreatic ducts were obtained, and three-dimensional MRCP images were rendered by post processing. CONTRAST:  5 mL of Gadavist. COMPARISON:  No priors. FINDINGS: Lower chest: Unremarkable. Hepatobiliary: No suspicious cystic or solid hepatic lesions. MRCP images demonstrate no intra or extrahepatic biliary ductal dilatation. Common bile duct is upper limits of normal measuring 6 mm in the porta hepatis. No filling defects in the common bile duct to suggest choledocholithiasis. Numerous filling defects within the gallbladder compatible with gallstones. Gallbladder does not appear distended. No pericholecystic fluid  or surrounding inflammatory  changes to suggest an acute cholecystitis at this time. Pancreas: No pancreatic mass. No pancreatic ductal dilatation noted on MRCP images. No pancreatic or peripancreatic fluid collections or inflammatory changes. Pancreatic parenchyma enhances normally. Spleen:  Unremarkable. Adrenals/Urinary Tract: Bilateral kidneys and adrenal glands are normal in appearance. No hydroureteronephrosis in the visualized portions of the abdomen. Stomach/Bowel: Normal appearance of the stomach. Duodenal diverticulum extending cephalad from the third portion of the duodenum incidentally noted. No surrounding inflammatory changes. Remaining visualized portions of small bowel and colon are grossly unremarkable in appearance. Vascular/Lymphatic: Aortic atherosclerosis, without evidence of aneurysm in the abdominal vasculature. Retroaortic left renal vein (normal anatomical variant) incidentally noted. No lymphadenopathy noted in the abdomen. Other: No significant volume of ascites noted in the visualized portions of the peritoneal cavity. Musculoskeletal: No aggressive appearing osseous lesions are noted in the visualized portions of the skeleton. IMPRESSION: 1. No imaging findings to suggest complicated pancreatitis. 2. No choledocholithiasis or other findings to explain the patient's recent history of acute pancreatitis. 3. Cholelithiasis without evidence of acute cholecystitis. 4. 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 Electronically Signed   By: Vinnie Langton M.D.   On: 08/06/2019 08:11   US Abdomen Limited Ruq  Result Date: 08/05/2019 CLINICAL DATA:  48 year old female with epigastric pain. EXAM: ULTRASOUND ABDOMEN LIMITED RIGHT UPPER QUADRANT COMPARISON:  Renal ultrasound dated 12/28/2015 and CT of the abdomen pelvis dated 01/11/2016 FINDINGS: Gallbladder: The gallbladder is filled with stones. No gallbladder wall thickening or pericholecystic fluid.  Negative sonographic Murphy's sign. Common bile duct: Diameter: 3 mm Liver: The liver is unremarkable. Portal vein is patent on color Doppler imaging with normal direction of blood flow towards the liver. Other: None. IMPRESSION: Cholelithiasis without sonographic evidence of acute cholecystitis. Electronically Signed   By: Anner Crete M.D.   On: 08/05/2019 17:08   Medications:  Scheduled Meds:  pantoprazole (PROTONIX) IV  40 mg Intravenous Q12H   Continuous Infusions:  sodium chloride 100 mL/hr at 08/07/19 1657   PRN Meds:.HYDROcodone-acetaminophen, morphine injection, ondansetron **OR** ondansetron (ZOFRAN) IV   Assessment: Right upper quadrant pain   Plan: Patient did not have any imaging findings to correlate to pancreatitis She has now undergone cholecystectomy with Dr. Windell Moment No further interventions needed from GI standpoint GI service will sign off, please page with any questions   LOS: 1 day   Vonda Antigua, MD 08/07/2019, 5:02 PM

## 2019-08-07 NOTE — Progress Notes (Signed)
Patient ID: Gail [name], female   DOB: 07/20/1971, 48 y.o.   MRN: [id number]   [facility] PROGRESS NOTE  Gail [name] [id number] DOB: 12-Jul-1971 DOA: 08/06/2019 PCP: Catheryn Bacon, CNM  HPI/Subjective: Patient seen this morning and she was feeling better.  No further nausea or vomiting.  Only slight abdominal pain.  Objective: Vitals:   08/07/19 1441 08/07/19 1456  BP: (!) 149/110 (!) 134/104  Pulse: 63 63  Resp: 19 19  Temp: (!) 97.5 F (36.4 C)   SpO2: 100% 100%    Intake/Output Summary (Last 24 hours) at 08/07/2019 1510 Last data filed at 08/07/2019 1425 Gross per 24 hour  Intake 3128.26 ml  Output 2810 ml  Net 318.26 ml   Filed Weights   08/05/19 2253 08/06/19 0446  Weight: 45.4 kg 52.7 kg    ROS: Review of Systems  Constitutional: Negative for chills and fever.  Eyes: Negative for blurred vision.  Respiratory: Negative for cough and shortness of breath.   Cardiovascular: Negative for chest pain.  Gastrointestinal: Positive for abdominal pain. Negative for constipation, diarrhea, nausea and vomiting.  Genitourinary: Negative for dysuria.  Musculoskeletal: Negative for joint pain.  Neurological: Negative for dizziness and headaches.   Exam: Physical Exam  Constitutional: She is oriented to person, place, and time.  HENT:  Nose: No mucosal edema.  Mouth/Throat: No oropharyngeal exudate or posterior oropharyngeal edema.  Eyes: Pupils are equal, round, and reactive to light. Conjunctivae, EOM and lids are normal.  Neck: No JVD present. Carotid bruit is not present. No edema present. No thyroid mass and no thyromegaly present.  Cardiovascular: S1 normal and S2 normal. Exam reveals no gallop.  No murmur heard. Pulses:      Dorsalis pedis pulses are 2+ on the right side and 2+ on the left side.  Respiratory: No respiratory distress. She has no wheezes. She has no rhonchi. She has no rales.  GI: Soft. Bowel sounds are normal. There is abdominal  tenderness in the right upper quadrant.  Musculoskeletal:     Right ankle: She exhibits no swelling.     Left ankle: She exhibits no swelling.  Lymphadenopathy:    She has no cervical adenopathy.  Neurological: She is alert and oriented to person, place, and time. No cranial nerve deficit.  Skin: Skin is warm. No rash noted. Nails show no clubbing.  Psychiatric: She has a normal mood and affect.      Data Reviewed: Basic Metabolic Panel: Recent Labs  Lab 08/05/19 1245 08/06/19 0127  NA 142 138  K 3.5 4.0  CL 108 103  CO2 27 28  GLUCOSE 88 118*  BUN 18 13  CREATININE 0.79 0.60  CALCIUM 8.9 8.9   Liver Function Tests: Recent Labs  Lab 08/05/19 1245 08/06/19 0127  AST 14* 32  ALT 24 33  ALKPHOS 61 78  BILITOT 0.7 0.9  PROT 6.5 6.9  ALBUMIN 3.6 4.0   Recent Labs  Lab 08/05/19 1245 08/06/19 0127  LIPASE 366* 102*   CBC: Recent Labs  Lab 08/05/19 1245 08/06/19 0127  WBC 11.7* 12.5*  NEUTROABS  --  10.1*  HGB 13.4 13.5  HCT 41.4 41.2  MCV 88.1 86.9  PLT 253 244     Recent Results (from the past 240 hour(s))  SARS Coronavirus 2 [facility] order, Performed in [facility] hospital lab) Nasopharyngeal Nasopharyngeal Swab     Status: None   Collection Time: 08/06/19  1:27 AM   Specimen: Nasopharyngeal Swab  Result  Value Ref Range Status   SARS Coronavirus 2 NEGATIVE NEGATIVE Final    Comment: (NOTE) If result is NEGATIVE SARS-CoV-2 target nucleic acids are NOT DETECTED. The SARS-CoV-2 RNA is generally detectable in upper and lower  respiratory specimens during the acute phase of infection. The lowest  concentration of SARS-CoV-2 viral copies this assay can detect is 250  copies / mL. A negative result does not preclude SARS-CoV-2 infection  and should not be used as the sole basis for treatment or other  patient management decisions.  A negative result may occur with  improper specimen collection / handling, submission of specimen other  than  nasopharyngeal swab, presence of viral mutation(s) within the  areas targeted by this assay, and inadequate number of viral copies  (<250 copies / mL). A negative result must be combined with clinical  observations, patient history, and epidemiological information. If result is POSITIVE SARS-CoV-2 target nucleic acids are DETECTED. The SARS-CoV-2 RNA is generally detectable in upper and lower  respiratory specimens dur ing the acute phase of infection.  Positive  results are indicative of active infection with SARS-CoV-2.  Clinical  correlation with patient history and other diagnostic information is  necessary to determine patient infection status.  Positive results do  not rule out bacterial infection or co-infection with other viruses. If result is PRESUMPTIVE POSTIVE SARS-CoV-2 nucleic acids MAY BE PRESENT.   A presumptive positive result was obtained on the submitted specimen  and confirmed on repeat testing.  While 2019 novel coronavirus  (SARS-CoV-2) nucleic acids may be present in the submitted sample  additional confirmatory testing may be necessary for epidemiological  and / or clinical management purposes  to differentiate between  SARS-CoV-2 and other Sarbecovirus currently known to infect humans.  If clinically indicated additional testing with an alternate test  methodology [phone]) is advised. The SARS-CoV-2 RNA is generally  detectable in upper and lower respiratory sp ecimens during the acute  phase of infection. The expected result is Negative. Fact Sheet for Patients:  StrictlyIdeas.no Fact Sheet for Healthcare Providers: BankingDealers.co.za This test is not yet approved or cleared by the [place] FDA and has been authorized for detection and/or diagnosis of SARS-CoV-2 by FDA under an Emergency Use Authorization (EUA).  This EUA will remain in effect (meaning this test can be used) for the duration of  the COVID-19 declaration under Section 564(b)(1) of the Act, 21 U.S.C. section 360bbb-3(b)(1), unless the authorization is terminated or revoked sooner. Performed at [facility], [address], [place], [place] [address]      Studies: Mr 3d Recon At Scanner  Result Date: 08/06/2019 CLINICAL DATA:  48 year old female with history of acute pancreatitis (first episode). Abdominal pain. EXAM: MRI ABDOMEN WITHOUT AND WITH CONTRAST (INCLUDING MRCP) TECHNIQUE: Multiplanar multisequence MR imaging of the abdomen was performed both before and after the administration of intravenous contrast. Heavily T2-weighted images of the biliary and pancreatic ducts were obtained, and three-dimensional MRCP images were rendered by post processing. CONTRAST:  5 mL of Gadavist. COMPARISON:  No priors. FINDINGS: Lower chest: Unremarkable. Hepatobiliary: No suspicious cystic or solid hepatic lesions. MRCP images demonstrate no intra or extrahepatic biliary ductal dilatation. Common bile duct is upper limits of normal measuring 6 mm in the porta hepatis. No filling defects in the common bile duct to suggest choledocholithiasis. Numerous filling defects within the gallbladder compatible with gallstones. Gallbladder does not appear distended. No pericholecystic fluid or surrounding inflammatory changes to suggest an acute cholecystitis at this time.  Pancreas: No pancreatic mass. No pancreatic ductal dilatation noted on MRCP images. No pancreatic or peripancreatic fluid collections or inflammatory changes. Pancreatic parenchyma enhances normally. Spleen:  Unremarkable. Adrenals/Urinary Tract: Bilateral kidneys and adrenal glands are normal in appearance. No hydroureteronephrosis in the visualized portions of the abdomen. Stomach/Bowel: Normal appearance of the stomach. Duodenal diverticulum extending cephalad from the third portion of the duodenum incidentally noted. No surrounding inflammatory changes. Remaining  visualized portions of small bowel and colon are grossly unremarkable in appearance. Vascular/Lymphatic: Aortic atherosclerosis, without evidence of aneurysm in the abdominal vasculature. Retroaortic left renal vein (normal anatomical variant) incidentally noted. No lymphadenopathy noted in the abdomen. Other: No significant volume of ascites noted in the visualized portions of the peritoneal cavity. Musculoskeletal: No aggressive appearing osseous lesions are noted in the visualized portions of the skeleton. IMPRESSION: 1. No imaging findings to suggest complicated pancreatitis. 2. No choledocholithiasis or other findings to explain the patient's recent history of acute pancreatitis. 3. Cholelithiasis without evidence of acute cholecystitis. 4. 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 Electronically Signed   By: Vinnie Langton M.D.   On: 08/06/2019 08:11   Mr Abdomen Mrcp Moise Boring Contast  Result Date: 08/06/2019 CLINICAL DATA:  48 year old female with history of acute pancreatitis (first episode). Abdominal pain. EXAM: MRI ABDOMEN WITHOUT AND WITH CONTRAST (INCLUDING MRCP) TECHNIQUE: Multiplanar multisequence MR imaging of the abdomen was performed both before and after the administration of intravenous contrast. Heavily T2-weighted images of the biliary and pancreatic ducts were obtained, and three-dimensional MRCP images were rendered by post processing. CONTRAST:  5 mL of Gadavist. COMPARISON:  No priors. FINDINGS: Lower chest: Unremarkable. Hepatobiliary: No suspicious cystic or solid hepatic lesions. MRCP images demonstrate no intra or extrahepatic biliary ductal dilatation. Common bile duct is upper limits of normal measuring 6 mm in the porta hepatis. No filling defects in the common bile duct to suggest choledocholithiasis. Numerous filling defects within the gallbladder compatible with gallstones. Gallbladder does not appear distended. No pericholecystic fluid  or surrounding inflammatory changes to suggest an acute cholecystitis at this time. Pancreas: No pancreatic mass. No pancreatic ductal dilatation noted on MRCP images. No pancreatic or peripancreatic fluid collections or inflammatory changes. Pancreatic parenchyma enhances normally. Spleen:  Unremarkable. Adrenals/Urinary Tract: Bilateral kidneys and adrenal glands are normal in appearance. No hydroureteronephrosis in the visualized portions of the abdomen. Stomach/Bowel: Normal appearance of the stomach. Duodenal diverticulum extending cephalad from the third portion of the duodenum incidentally noted. No surrounding inflammatory changes. Remaining visualized portions of small bowel and colon are grossly unremarkable in appearance. Vascular/Lymphatic: Aortic atherosclerosis, without evidence of aneurysm in the abdominal vasculature. Retroaortic left renal vein (normal anatomical variant) incidentally noted. No lymphadenopathy noted in the abdomen. Other: No significant volume of ascites noted in the visualized portions of the peritoneal cavity. Musculoskeletal: No aggressive appearing osseous lesions are noted in the visualized portions of the skeleton. IMPRESSION: 1. No imaging findings to suggest complicated pancreatitis. 2. No choledocholithiasis or other findings to explain the patient's recent history of acute pancreatitis. 3. Cholelithiasis without evidence of acute cholecystitis. 4. 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 Electronically Signed   By: Vinnie Langton M.D.   On: 08/06/2019 08:11   US Abdomen Limited Ruq  Result Date: 08/05/2019 CLINICAL DATA:  48 year old female with epigastric pain. EXAM: ULTRASOUND ABDOMEN LIMITED RIGHT UPPER QUADRANT COMPARISON:  Renal ultrasound dated 12/28/2015 and CT of the abdomen pelvis dated 01/11/2016 FINDINGS: Gallbladder:  The gallbladder is filled with stones. No gallbladder wall thickening or pericholecystic fluid.  Negative sonographic Murphy's sign. Common bile duct: Diameter: 3 mm Liver: The liver is unremarkable. Portal vein is patent on color Doppler imaging with normal direction of blood flow towards the liver. Other: None. IMPRESSION: Cholelithiasis without sonographic evidence of acute cholecystitis. Electronically Signed   By: Anner Crete M.D.   On: 08/05/2019 17:08    Scheduled Meds: . fentaNYL      . morphine      . [MAR Hold] pantoprazole (PROTONIX) IV  40 mg Intravenous Q12H   Continuous Infusions: . sodium chloride 100 mL/hr at 08/07/19 0750    Assessment/Plan:  1. Gallstone pancreatitis.  Patient to have laparoscopic cholecystectomy today.  Lipase trended better.  If all goes well potentially can go home tomorrow. 2. Elevated blood pressure likely secondary to pain.  Continue to monitor  Code Status:     Code Status Orders  (From admission, onward)         Start     Ordered   08/06/19 0414  Full code  Continuous     08/06/19 0413        Code Status History    Date Active Date Inactive Code Status Order ID Comments User Context   04/15/2015 1240 04/17/2015 0008 Full Code [id number]  Schermerhorn, Gwen Her, MD Inpatient   Advance Care Planning Activity     Disposition Plan: Reevaluate tomorrow  Consultants:  Gastroenterology  General surgery  Time spent: 25 minutes  [place]

## 2019-08-08 MED ORDER — ONDANSETRON HCL 4 MG PO TABS: 4.0000 mg | ORAL_TABLET | Freq: Three times a day (TID) | ORAL | 0 refills | Status: DC | PRN

## 2019-08-08 MED ORDER — HYDROCODONE-ACETAMINOPHEN 5-325 MG PO TABS: 1.0000 | ORAL_TABLET | Freq: Four times a day (QID) | ORAL | 0 refills | Status: DC | PRN

## 2019-08-08 MED ORDER — PANTOPRAZOLE SODIUM 40 MG PO TBEC: 40.0000 mg | DELAYED_RELEASE_TABLET | Freq: Every day | ORAL | 0 refills | Status: DC

## 2019-08-08 NOTE — Progress Notes (Signed)
Discharge instructions given to patient with Husband in the room. Understanding was expressed in regards to medication and follow-up appointments. IV was removed and patient tolerated it well. Patient was taken to her vehicle by wheelchair by one of our staff.

## 2019-08-08 NOTE — Discharge Instructions (Signed)

## 2019-08-08 NOTE — Discharge Summary (Signed)
[facility] [place] NAME: Gail [name]    MR#:  [id number]  DATE OF BIRTH:  02-11-1971  DATE OF ADMISSION:  08/06/2019 ADMITTING PHYSICIAN: Harrie Foreman, MD  DATE OF DISCHARGE: 08/08/2019  PRIMARY CARE PHYSICIAN: Dr Otilio Miu   ADMISSION DIAGNOSIS:  Pain [R52] Acute pancreatitis, unspecified complication status, unspecified pancreatitis type [K85.90]  DISCHARGE DIAGNOSIS:  Gallstone pancreatitis  SECONDARY DIAGNOSIS:   Past Medical History:  Diagnosis Date  . Abdominal pain 10/08/2013   Last Assessment & Plan:  Abdominal pain seems multifactorial.  There is a component of pelvic floor myalgia on exam.  Her symptoms are also consistent with possible acute GI viral illness.  No evidence of acute emergent pathology to explain pain.  Advised supportive measures for GI symptoms and pain control with oral medications.  As noted in separate problem, renal stone may also be contributing.  Will follow up in 1-2 weeks to assess progress of symptoms.     . Abnormal uterine bleeding 03/24/2015   Last Assessment & Plan:  EMB pathology results still pending.  Plan to follow up with Dr Tressie Ellis as previously planned once results are available.  No bleeding concerns today.     . Anemia   . Calculus of kidney 01/25/2015   Last Assessment & Plan:  While her abdominal pain symptoms are not entirely consistent with the presence of her kidney stone, it is a possible contributor.  Rx provided for motrin and tamulosin to aid with passage of stone.     . Chronic pelvic pain in female 04/06/2015  . Complication of anesthesia    n/v  . 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 Episode of syncope 10/08/2013  . PONV (postoperative nausea and vomiting)     HOSPITAL COURSE:   1.  Gallstone pancreatitis.  Pancreatic enzymes never went that high. MRCP did not show any stones in the ducts.  I asked general surgery too see to evaluate the gallbladder.  Patient  was take for a laparoscopic cholecystectomy by Dr. Windell Moment on 08/07/2019.  Patient tolerated diet and was discharged on 08/08/2019. 2. Elevated blood pressure secondary to pain.  BP now normal. 3. Back pain.  Has follow up as outpatient.  DISCHARGE CONDITIONS:   satisfactory  CONSULTS OBTAINED:  Treatment Team:  Herbert Pun, MD  DRUG ALLERGIES:   Allergies  Allergen Reactions  . Ciprodex [Ciprofloxacin-Dexamethasone] Itching  . Oxycodone Nausea And Vomiting  . Sulfamethoxazole-Trimethoprim Nausea And Vomiting and Rash    DISCHARGE MEDICATIONS:   Allergies as of 08/08/2019      Reactions   Ciprodex [ciprofloxacin-dexamethasone] Itching   Oxycodone Nausea And Vomiting   Sulfamethoxazole-trimethoprim Nausea And Vomiting, Rash      Medication List    STOP taking these medications   benzonatate 200 MG capsule Commonly known as: TESSALON   meloxicam 15 MG tablet Commonly known as: MOBIC     TAKE these medications   acetaminophen 500 MG tablet Commonly known as: TYLENOL Take 500 mg by mouth every 6 (six) hours as needed.   HYDROcodone-acetaminophen 5-325 MG tablet Commonly known as: Norco Take 1 tablet by mouth every 6 (six) hours as needed for moderate pain.   ondansetron 4 MG tablet Commonly known as: Zofran Take 1 tablet (4 mg total) by mouth every 8 (eight) hours as needed.   pantoprazole 40 MG tablet Commonly known as: Protonix Take 1 tablet (40 mg total) by mouth daily.  DISCHARGE INSTRUCTIONS:   F/u PMD 5 days F/u Surgery 2 weeks  If you experience worsening of your admission symptoms, develop shortness of breath, life threatening emergency, suicidal or homicidal thoughts you must seek medical attention immediately by calling 911 or calling your MD immediately  if symptoms less severe.  You Must read complete instructions/literature along with all the possible adverse reactions/side effects for all the Medicines you take and that have  been prescribed to you. Take any new Medicines after you have completely understood and accept all the possible adverse reactions/side effects.   Please note  You were cared for by a hospitalist during your hospital stay. If you have any questions about your discharge medications or the care you received while you were in the hospital after you are discharged, you can call the unit and asked to speak with the hospitalist on call if the hospitalist that took care of you is not available. Once you are discharged, your primary care physician will handle any further medical issues. Please note that NO REFILLS for any discharge medications will be authorized once you are discharged, as it is imperative that you return to your primary care physician (or establish a relationship with a primary care physician if you do not have one) for your aftercare needs so that they can reassess your need for medications and monitor your lab values.    Today   CHIEF COMPLAINT:   Chief Complaint  Patient presents with  . Abdominal Pain    HISTORY OF PRESENT ILLNESS:  Gail [name]  is a 48 y.o. female came in with abdominal pain   VITAL SIGNS:  Blood pressure 122/61, pulse 61, temperature 98.1 F (36.7 C), temperature source Oral, resp. rate 20, height 5\' 2"  (1.575 m), weight 52.7 kg, last menstrual period 04/01/2015, SpO2 99 %.  I/O:    Intake/Output Summary (Last 24 hours) at 08/08/2019 1247 Last data filed at 08/08/2019 1116 Gross per 24 hour  Intake 2635.08 ml  Output 1710 ml  Net 925.08 ml    PHYSICAL EXAMINATION:  GENERAL:  48 y.o.-year-old patient lying in the bed with no acute distress.  EYES: Pupils equal, round, reactive to light and accommodation. No scleral icterus. Extraocular muscles intact.  HEENT: Head atraumatic, normocephalic. Oropharynx and nasopharynx clear.  NECK:  Supple, no jugular venous distention. No thyroid enlargement, no tenderness.  LUNGS: Normal breath sounds  bilaterally, no wheezing, rales,rhonchi or crepitation. No use of accessory muscles of respiration.  CARDIOVASCULAR: S1, S2 normal. No murmurs, rubs, or gallops.  ABDOMEN: Soft, tender, non-distended. Bowel sounds present. No organomegaly or mass.  EXTREMITIES: No pedal edema, cyanosis, or clubbing.  NEUROLOGIC: Cranial nerves II through XII are intact. Muscle strength 5/5 in all extremities. Sensation intact. Gait not checked.  PSYCHIATRIC: The patient is alert and oriented x 3.  SKIN: No obvious rash, lesion, or ulcer.   DATA REVIEW:   CBC Recent Labs  Lab 08/06/19 0127  WBC 12.5*  HGB 13.5  HCT 41.2  PLT 244    Chemistries  Recent Labs  Lab 08/06/19 0127  NA 138  K 4.0  CL 103  CO2 28  GLUCOSE 118*  BUN 13  CREATININE 0.60  CALCIUM 8.9  AST 32  ALT 33  ALKPHOS 78  BILITOT 0.9      Microbiology Results  Results for orders placed or performed during the hospital encounter of 08/06/19  SARS Coronavirus 2 [facility] order, Performed in [facility] hospital lab) Nasopharyngeal Nasopharyngeal Swab  Status: None   Collection Time: 08/06/19  1:27 AM   Specimen: Nasopharyngeal Swab  Result Value Ref Range Status   SARS Coronavirus 2 NEGATIVE NEGATIVE Final    Comment: (NOTE) If result is NEGATIVE SARS-CoV-2 target nucleic acids are NOT DETECTED. The SARS-CoV-2 RNA is generally detectable in upper and lower  respiratory specimens during the acute phase of infection. The lowest  concentration of SARS-CoV-2 viral copies this assay can detect is 250  copies / mL. A negative result does not preclude SARS-CoV-2 infection  and should not be used as the sole basis for treatment or other  patient management decisions.  A negative result may occur with  improper specimen collection / handling, submission of specimen other  than nasopharyngeal swab, presence of viral mutation(s) within the  areas targeted by this assay, and inadequate number of viral copies  (<250 copies /  mL). A negative result must be combined with clinical  observations, patient history, and epidemiological information. If result is POSITIVE SARS-CoV-2 target nucleic acids are DETECTED. The SARS-CoV-2 RNA is generally detectable in upper and lower  respiratory specimens dur ing the acute phase of infection.  Positive  results are indicative of active infection with SARS-CoV-2.  Clinical  correlation with patient history and other diagnostic information is  necessary to determine patient infection status.  Positive results do  not rule out bacterial infection or co-infection with other viruses. If result is PRESUMPTIVE POSTIVE SARS-CoV-2 nucleic acids MAY BE PRESENT.   A presumptive positive result was obtained on the submitted specimen  and confirmed on repeat testing.  While 2019 novel coronavirus  (SARS-CoV-2) nucleic acids may be present in the submitted sample  additional confirmatory testing may be necessary for epidemiological  and / or clinical management purposes  to differentiate between  SARS-CoV-2 and other Sarbecovirus currently known to infect humans.  If clinically indicated additional testing with an alternate test  methodology [phone]) is advised. The SARS-CoV-2 RNA is generally  detectable in upper and lower respiratory sp ecimens during the acute  phase of infection. The expected result is Negative. Fact Sheet for Patients:  StrictlyIdeas.no Fact Sheet for Healthcare Providers: BankingDealers.co.za This test is not yet approved or cleared by the [place] FDA and has been authorized for detection and/or diagnosis of SARS-CoV-2 by FDA under an Emergency Use Authorization (EUA).  This EUA will remain in effect (meaning this test can be used) for the duration of the COVID-19 declaration under Section 564(b)(1) of the Act, 21 U.S.C. section 360bbb-3(b)(1), unless the authorization is terminated or revoked  sooner. Performed at [facility], [address], [place], [place] [address]      Management plans discussed with the patient, family and they are in agreement.  CODE STATUS:     Code Status Orders  (From admission, onward)         Start     Ordered   08/06/19 0414  Full code  Continuous     08/06/19 0413        Code Status History    Date Active Date Inactive Code Status Order ID Comments User Context   04/15/2015 1240 04/17/2015 0008 Full Code [id number]  Schermerhorn, Gwen Her, MD Inpatient   Advance Care Planning Activity      TOTAL TIME TAKING CARE OF THIS PATIENT: 35 minutes.    Loletha Grayer M.D on 08/08/2019 at 12:47 PM  Between 7am to 6pm - Pager - [phone]  After 6pm go to [email or web address] - password  [facility]  [facility] Office  [phone]  CC: Primary care physician; Dr Otilio Miu

## 2019-08-08 NOTE — Progress Notes (Signed)
[facility] Day(s): 2.   Post op day(s): 1 Day Post-Op.   Interval History: Patient seen and examined, no acute events or new complaints overnight. Patient reports feeling much better today.  She reports that the pain has improved significantly.  She has been tolerating diet.  Denies nausea or vomiting.  Wound looks dry and clean.   Vital signs in last 24 hours: [min-max] current  Temp:  [97.2 F (36.2 C)-98.4 F (36.9 C)] 98.1 F (36.7 C) (09/05 0443) Pulse Rate:  [53-70] 61 (09/05 0443) Resp:  [11-20] 20 (09/05 0443) BP: (106-179)/(61-110) 122/61 (09/05 0443) SpO2:  [90 %-100 %] 99 % (09/05 0443)     Height: 5\' 2"  (157.5 cm) Weight: 52.7 kg BMI (Calculated): 21.24   Physical Exam:  Constitutional: alert, cooperative and no distress  Respiratory: breathing non-labored at rest  Cardiovascular: regular rate and sinus rhythm  Gastrointestinal: soft, mild-tender, and non-distended  Labs:  CBC Latest Ref Rng & Units 08/06/2019 08/05/2019 03/03/2019  WBC 4.0 - 10.5 K/uL 12.5(H) 11.7(H) 4.9  Hemoglobin 12.0 - 15.0 g/dL 13.5 13.4 13.1  Hematocrit 36.0 - 46.0 % 41.2 41.4 40.4  Platelets 150 - 400 K/uL 244 253 232   CMP Latest Ref Rng & Units 08/06/2019 08/05/2019 03/03/2019  Glucose 70 - 99 mg/dL 118(H) 88 131(H)  BUN 6 - 20 mg/dL 13 18 17   Creatinine 0.44 - 1.00 mg/dL 0.60 0.79 0.61  Sodium 135 - 145 mmol/L 138 142 141  Potassium 3.5 - 5.1 mmol/L 4.0 3.5 3.5  Chloride 98 - 111 mmol/L 103 108 106  CO2 22 - 32 mmol/L 28 27 27   Calcium 8.9 - 10.3 mg/dL 8.9 8.9 9.2  Total Protein 6.5 - 8.1 g/dL 6.9 6.5 7.1  Total Bilirubin 0.3 - 1.2 mg/dL 0.9 0.7 0.6  Alkaline Phos 38 - 126 U/L 78 61 74  AST 15 - 41 U/L 32 14(L) 28  ALT 0 - 44 U/L 33 24 32    Imaging studies: No new pertinent imaging studies   Assessment/Plan:  48 y.o. female with gallstone pancreatitis 1 Day Post-Op s/p laparoscopic cholecystectomy. Patient recovering well.  Tolerated surgery.  Today with no  fever and no tachycardia.  Abdomen is soft with post operative expected pain.  Pain is controlled with current pain medication.  Patient tolerating diet.  From surgery standpoint patient can be discharged.  I still an order of Norco already prescribed few days ago.  I agree with Norco as the pain management.  She can take ibuprofen as needed.  Arnold Long, MD

## 2019-08-09 ENCOUNTER — Encounter: Payer: Self-pay | Admitting: General Surgery

## 2019-08-12 LAB — SURGICAL PATHOLOGY

## 2019-08-13 NOTE — Anesthesia Postprocedure Evaluation (Signed)
Anesthesia Post Note  Patient: Gail [name]  Procedure(s) Performed: LAPAROSCOPIC CHOLECYSTECTOMY (N/A )  Patient location during evaluation: PACU Anesthesia Type: General Level of consciousness: awake and alert Pain management: pain level controlled Vital Signs Assessment: post-procedure vital signs reviewed and stable Respiratory status: spontaneous breathing, nonlabored ventilation and respiratory function stable Cardiovascular status: blood pressure returned to baseline and stable Postop Assessment: no apparent nausea or vomiting Anesthetic complications: no     Last Vitals:  Vitals:   08/07/19 2012 08/08/19 0443  BP: 139/67 122/61  Pulse: (!) 58 61  Resp: 18 20  Temp: 36.5 C 36.7 C  SpO2: 100% 99%    Last Pain:  Vitals:   08/08/19 1154  TempSrc:   PainSc: [place]

## 2019-09-16 ENCOUNTER — Other Ambulatory Visit: Admission: RE | Admit: 2019-09-16 | Payer: Federal, State, Local not specified - PPO | Source: Ambulatory Visit

## 2019-09-17 ENCOUNTER — Encounter
Admission: RE | Admit: 2019-09-17 | Discharge: 2019-09-17 | Disposition: A | Discharge status: Home / Self Care | Payer: No Typology Code available for payment source | Source: Ambulatory Visit | Attending: Orthopedic Surgery | Admitting: Orthopedic Surgery

## 2019-09-17 ENCOUNTER — Other Ambulatory Visit: Payer: Self-pay

## 2019-09-17 ENCOUNTER — Other Ambulatory Visit
Admission: RE | Admit: 2019-09-17 | Discharge: 2019-09-17 | Disposition: A | Discharge status: Home / Self Care | Payer: No Typology Code available for payment source | Source: Ambulatory Visit | Attending: Orthopedic Surgery | Admitting: Orthopedic Surgery

## 2019-09-17 DIAGNOSIS — Z20828 Contact with and (suspected) exposure to other viral communicable diseases: Secondary | ICD-10-CM | POA: Diagnosis not present

## 2019-09-17 DIAGNOSIS — Z01812 Encounter for preprocedural laboratory examination: Secondary | ICD-10-CM | POA: Diagnosis not present

## 2019-09-17 HISTORY — DX: Pain in unspecified knee: M25.569

## 2019-09-17 NOTE — Patient Instructions (Signed)
Your procedure is scheduled on: 09/21/2019 Mon Report to Same Day Surgery 2nd floor medical mall [facility] Entrance-take elevator on left to 2nd floor.  Check in with surgery information desk.) To find out your arrival time please call [phone] between 1PM - 3PM on 09/18/2019 Fri  Remember: Instructions that are not followed completely may result in serious medical risk, up to and including death, or upon the discretion of your surgeon and anesthesiologist your surgery may need to be rescheduled.    _x___ 1. Do not eat food after midnight the night before your procedure. You may drink clear liquids up to 2 hours before you are scheduled to arrive at the hospital for your procedure.  Do not drink clear liquids within 2 hours of your scheduled arrival to the hospital.  Clear liquids include  --Water or Apple juice without pulp  --Clear carbohydrate beverage such as ClearFast or Gatorade  --Black Coffee or Clear Tea (No milk, no creamers, do not add anything to                  the coffee or Tea Type 1 and type 2 diabetics should only drink water.   ____Ensure clear carbohydrate drink on the way to the hospital for bariatric patients  ____Ensure clear carbohydrate drink 3 hours before surgery.   No gum chewing or hard candies.     __x__ 2. No Alcohol for 24 hours before or after surgery.   __x__3. No Smoking or e-cigarettes for 24 prior to surgery.  Do not use any chewable tobacco products for at least 6 hour prior to surgery   ____  4. Bring all medications with you on the day of surgery if instructed.    __x__ 5. Notify your doctor if there is any change in your medical condition     (cold, fever, infections).    x___6. On the morning of surgery brush your teeth with toothpaste and water.  You may rinse your mouth with mouth wash if you wish.  Do not swallow any toothpaste or mouthwash.   Do not wear jewelry, make-up, hairpins, clips or nail polish.  Do not wear lotions,  powders, or perfumes. You may wear deodorant.  Do not shave 48 hours prior to surgery. Men may shave face and neck.  Do not bring valuables to the hospital.    [facility] is not responsible for any belongings or valuables.               Contacts, dentures or bridgework may not be worn into surgery.  Leave your suitcase in the car. After surgery it may be brought to your room.  For patients admitted to the hospital, discharge time is determined by your                       treatment team.  _  Patients discharged the day of surgery will not be allowed to drive home.  You will need someone to drive you home and stay with you the night of your procedure.    Please read over the following fact sheets that you were given:   [facility] Preparing for Surgery and or MRSA Information   _x___ Take anti-hypertensive listed below, cardiac, seizure, asthma,     anti-reflux and psychiatric medicines. These include:  1. None  2.  3.  4.  5.  6.  ____Fleets enema or Magnesium Citrate as directed.   _x___ Use CHG Soap or  sage wipes as directed on instruction sheet   ____ Use inhalers on the day of surgery and bring to hospital day of surgery  ____ Stop Metformin and Janumet 2 days prior to surgery.    ____ Take 1/2 of usual insulin dose the night before surgery and none on the morning     surgery.   _x___ Follow recommendations from Cardiologist, Pulmonologist or PCP regarding          stopping Aspirin, Coumadin, Plavix ,Eliquis, Effient, or Pradaxa, and Pletal.  X____Stop Anti-inflammatories such as Advil, Aleve, Ibuprofen, Motrin, Naproxen, Naprosyn, Goodies powders or aspirin products. OK to take Tylenol and                          Celebrex.   _x___ Stop supplements until after surgery.  But may continue Vitamin D, Vitamin B,       and multivitamin.   ____ Bring C-Pap to the hospital.

## 2019-09-18 LAB — SARS CORONAVIRUS 2 (TAT 6-24 HRS): SARS Coronavirus 2: NEGATIVE

## 2019-09-21 ENCOUNTER — Encounter: Payer: Self-pay | Admitting: *Deleted

## 2019-09-21 ENCOUNTER — Ambulatory Visit: Payer: No Typology Code available for payment source | Admitting: Anesthesiology

## 2019-09-21 ENCOUNTER — Encounter
Admission: RE | Disposition: A | Discharge status: Home / Self Care | Payer: Self-pay | Source: Home / Self Care | Attending: Orthopedic Surgery

## 2019-09-21 ENCOUNTER — Ambulatory Visit: Payer: No Typology Code available for payment source

## 2019-09-21 ENCOUNTER — Other Ambulatory Visit: Payer: Self-pay

## 2019-09-21 ENCOUNTER — Ambulatory Visit
Admission: RE | Admit: 2019-09-21 | Discharge: 2019-09-21 | Disposition: A | Discharge status: Home / Self Care | Payer: No Typology Code available for payment source | Attending: Orthopedic Surgery | Admitting: Orthopedic Surgery

## 2019-09-21 DIAGNOSIS — Z881 Allergy status to other antibiotic agents status: Secondary | ICD-10-CM | POA: Diagnosis not present

## 2019-09-21 DIAGNOSIS — Z8249 Family history of ischemic heart disease and other diseases of the circulatory system: Secondary | ICD-10-CM | POA: Insufficient documentation

## 2019-09-21 DIAGNOSIS — S83281A Other tear of lateral meniscus, current injury, right knee, initial encounter: Secondary | ICD-10-CM | POA: Insufficient documentation

## 2019-09-21 DIAGNOSIS — M659 Synovitis and tenosynovitis, unspecified: Secondary | ICD-10-CM | POA: Insufficient documentation

## 2019-09-21 DIAGNOSIS — M25861 Other specified joint disorders, right knee: Secondary | ICD-10-CM | POA: Diagnosis not present

## 2019-09-21 DIAGNOSIS — M1711 Unilateral primary osteoarthritis, right knee: Secondary | ICD-10-CM | POA: Diagnosis not present

## 2019-09-21 DIAGNOSIS — Z79899 Other long term (current) drug therapy: Secondary | ICD-10-CM | POA: Diagnosis not present

## 2019-09-21 DIAGNOSIS — X500XXA Overexertion from strenuous movement or load, initial encounter: Secondary | ICD-10-CM | POA: Diagnosis not present

## 2019-09-21 DIAGNOSIS — D649 Anemia, unspecified: Secondary | ICD-10-CM | POA: Diagnosis not present

## 2019-09-21 DIAGNOSIS — Z90710 Acquired absence of both cervix and uterus: Secondary | ICD-10-CM | POA: Insufficient documentation

## 2019-09-21 DIAGNOSIS — Z87442 Personal history of urinary calculi: Secondary | ICD-10-CM | POA: Diagnosis not present

## 2019-09-21 DIAGNOSIS — Z885 Allergy status to narcotic agent status: Secondary | ICD-10-CM | POA: Diagnosis not present

## 2019-09-21 HISTORY — PX: KNEE ARTHROSCOPY WITH LATERAL RELEASE: SHX5649

## 2019-09-21 SURGERY — ARTHROSCOPY, KNEE, WITH LATERAL RETINACULUM RELEASE
Anesthesia: General | Site: Knee | Laterality: Right

## 2019-09-21 MED ORDER — TRAMADOL HCL 50 MG PO TABS: 50.0000 mg | ORAL_TABLET | ORAL | 0 refills | Status: DC | PRN

## 2019-09-21 MED ORDER — HYDROMORPHONE HCL 1 MG/ML IJ SOLN
INTRAMUSCULAR | Status: AC
  Administered 2019-09-21: 15:00:00 0.25 mg via INTRAVENOUS
  Filled 2019-09-21: qty 1

## 2019-09-21 MED ORDER — HYDROMORPHONE HCL 1 MG/ML IJ SOLN
INTRAMUSCULAR | Status: AC
  Administered 2019-09-21: 0.25 mg via INTRAVENOUS
  Filled 2019-09-21: qty 1

## 2019-09-21 MED ORDER — FAMOTIDINE 20 MG PO TABS
ORAL_TABLET | ORAL | Status: AC
  Filled 2019-09-21: qty 1

## 2019-09-21 MED ORDER — FAMOTIDINE 20 MG PO TABS
20.0000 mg | ORAL_TABLET | Freq: Once | ORAL | Status: AC
  Administered 2019-09-21: 10:00:00 20 mg via ORAL

## 2019-09-21 MED ORDER — MORPHINE SULFATE (PF) 4 MG/ML IV SOLN
INTRAVENOUS | Status: AC
  Filled 2019-09-21: qty 1

## 2019-09-21 MED ORDER — SODIUM CHLORIDE FLUSH 0.9 % IV SOLN
INTRAVENOUS | Status: AC
  Filled 2019-09-21: qty 10

## 2019-09-21 MED ORDER — ONDANSETRON 4 MG PO TBDP: 4.0000 mg | ORAL_TABLET | Freq: Three times a day (TID) | ORAL | 0 refills | Status: AC | PRN

## 2019-09-21 MED ORDER — SUGAMMADEX SODIUM 200 MG/2ML IV SOLN
INTRAVENOUS | Status: AC
  Filled 2019-09-21: qty 2

## 2019-09-21 MED ORDER — LIDOCAINE-EPINEPHRINE 1 %-1:100000 IJ SOLN
INTRAMUSCULAR | Status: AC
  Filled 2019-09-21: qty 1

## 2019-09-21 MED ORDER — CEFAZOLIN SODIUM-DEXTROSE 2-4 GM/100ML-% IV SOLN
2.0000 g | Freq: Once | INTRAVENOUS | Status: AC
  Administered 2019-09-21: 2 g via INTRAVENOUS

## 2019-09-21 MED ORDER — SEVOFLURANE IN SOLN
RESPIRATORY_TRACT | Status: AC
  Filled 2019-09-21: qty 250

## 2019-09-21 MED ORDER — KETAMINE HCL 50 MG/ML IJ SOLN
INTRAMUSCULAR | Status: AC
  Filled 2019-09-21: qty 10

## 2019-09-21 MED ORDER — LACTATED RINGERS IV SOLN
INTRAVENOUS | Status: DC | PRN
  Administered 2019-09-21: 4 mL

## 2019-09-21 MED ORDER — BUPIVACAINE HCL (PF) 0.5 % IJ SOLN
INTRAMUSCULAR | Status: AC
  Filled 2019-09-21: qty 30

## 2019-09-21 MED ORDER — LIDOCAINE HCL (PF) 1 % IJ SOLN
INTRAMUSCULAR | Status: AC
  Filled 2019-09-21: qty 5

## 2019-09-21 MED ORDER — HYDROMORPHONE HCL 1 MG/ML IJ SOLN
INTRAMUSCULAR | Status: AC
  Filled 2019-09-21: qty 1

## 2019-09-21 MED ORDER — MORPHINE SULFATE (PF) 4 MG/ML IV SOLN
2.0000 mg | INTRAVENOUS | Status: AC | PRN
  Administered 2019-09-21 (×4): 2 mg via INTRAVENOUS

## 2019-09-21 MED ORDER — CEFAZOLIN SODIUM-DEXTROSE 2-4 GM/100ML-% IV SOLN
INTRAVENOUS | Status: AC
  Filled 2019-09-21: qty 100

## 2019-09-21 MED ORDER — ACETAMINOPHEN 500 MG PO TABS: 1000.0000 mg | ORAL_TABLET | Freq: Three times a day (TID) | ORAL | 2 refills | Status: AC

## 2019-09-21 MED ORDER — KETAMINE HCL 10 MG/ML IJ SOLN
INTRAMUSCULAR | Status: DC | PRN
  Administered 2019-09-21: 50 mg via INTRAVENOUS

## 2019-09-21 MED ORDER — PROMETHAZINE HCL 25 MG/ML IJ SOLN
6.2500 mg | Freq: Once | INTRAMUSCULAR | Status: AC
  Administered 2019-09-21: 14:00:00 6.25 mg via INTRAVENOUS

## 2019-09-21 MED ORDER — KETOROLAC TROMETHAMINE 30 MG/ML IJ SOLN
INTRAMUSCULAR | Status: DC | PRN
  Administered 2019-09-21: 30 mg via INTRAVENOUS

## 2019-09-21 MED ORDER — MIDAZOLAM HCL 2 MG/2ML IJ SOLN
INTRAMUSCULAR | Status: AC
  Filled 2019-09-21: qty 2

## 2019-09-21 MED ORDER — ROPIVACAINE HCL 5 MG/ML IJ SOLN
INTRAMUSCULAR | Status: AC
  Filled 2019-09-21: qty 30

## 2019-09-21 MED ORDER — DEXAMETHASONE SODIUM PHOSPHATE 10 MG/ML IJ SOLN
INTRAMUSCULAR | Status: DC | PRN
  Administered 2019-09-21: 10 mg via INTRAVENOUS

## 2019-09-21 MED ORDER — EPINEPHRINE PF 1 MG/ML IJ SOLN
INTRAMUSCULAR | Status: AC
Start: 2019-09-21 — End: ?
  Filled 2019-09-21: qty 4

## 2019-09-21 MED ORDER — BUPIVACAINE HCL (PF) 0.5 % IJ SOLN
INTRAMUSCULAR | Status: DC | PRN
  Administered 2019-09-21: 8 mL

## 2019-09-21 MED ORDER — DEXAMETHASONE SODIUM PHOSPHATE 10 MG/ML IJ SOLN
INTRAMUSCULAR | Status: AC
Start: 2019-09-21 — End: ?
  Filled 2019-09-21: qty 1

## 2019-09-21 MED ORDER — ACETAMINOPHEN 500 MG PO TABS
1000.0000 mg | ORAL_TABLET | Freq: Once | ORAL | Status: AC
  Administered 2019-09-21: 14:00:00 1000 mg via ORAL
  Filled 2019-09-21: qty 2

## 2019-09-21 MED ORDER — SUGAMMADEX SODIUM 200 MG/2ML IV SOLN
INTRAVENOUS | Status: DC | PRN
  Administered 2019-09-21: 102 mg via INTRAVENOUS

## 2019-09-21 MED ORDER — PROMETHAZINE HCL 25 MG/ML IJ SOLN
INTRAMUSCULAR | Status: AC
  Filled 2019-09-21: qty 1

## 2019-09-21 MED ORDER — PROPOFOL 10 MG/ML IV BOLUS
INTRAVENOUS | Status: AC
  Filled 2019-09-21: qty 20

## 2019-09-21 MED ORDER — LACTATED RINGERS IV SOLN
INTRAVENOUS | Status: DC
  Administered 2019-09-21: 10:00:00 via INTRAVENOUS

## 2019-09-21 MED ORDER — IBUPROFEN 800 MG PO TABS: 800.0000 mg | ORAL_TABLET | Freq: Three times a day (TID) | ORAL | 1 refills | Status: AC

## 2019-09-21 MED ORDER — ASPIRIN EC 325 MG PO TBEC: 325.0000 mg | DELAYED_RELEASE_TABLET | Freq: Every day | ORAL | 0 refills | Status: AC

## 2019-09-21 MED ORDER — ONDANSETRON HCL 4 MG/2ML IJ SOLN
INTRAMUSCULAR | Status: AC
  Filled 2019-09-21: qty 2

## 2019-09-21 MED ORDER — MIDAZOLAM HCL 2 MG/2ML IJ SOLN
INTRAMUSCULAR | Status: DC | PRN
  Administered 2019-09-21: 2 mg via INTRAVENOUS

## 2019-09-21 MED ORDER — HYDROMORPHONE HCL 1 MG/ML IJ SOLN
0.2500 mg | INTRAMUSCULAR | Status: AC | PRN
  Administered 2019-09-21 (×8): 0.25 mg via INTRAVENOUS

## 2019-09-21 MED ORDER — FENTANYL CITRATE (PF) 100 MCG/2ML IJ SOLN
INTRAMUSCULAR | Status: AC
  Filled 2019-09-21: qty 2

## 2019-09-21 MED ORDER — KETOROLAC TROMETHAMINE 30 MG/ML IJ SOLN
INTRAMUSCULAR | Status: AC
  Filled 2019-09-21: qty 1

## 2019-09-21 MED ORDER — FENTANYL CITRATE (PF) 100 MCG/2ML IJ SOLN
INTRAMUSCULAR | Status: DC | PRN
  Administered 2019-09-21 (×2): 50 ug via INTRAVENOUS

## 2019-09-21 MED ORDER — ROCURONIUM BROMIDE 100 MG/10ML IV SOLN
INTRAVENOUS | Status: DC | PRN
  Administered 2019-09-21: 40 mg via INTRAVENOUS

## 2019-09-21 MED ORDER — ACETAMINOPHEN 500 MG PO TABS
ORAL_TABLET | ORAL | Status: AC
  Administered 2019-09-21: 14:00:00 1000 mg via ORAL
  Filled 2019-09-21: qty 2

## 2019-09-21 MED ORDER — ONDANSETRON HCL 4 MG/2ML IJ SOLN
INTRAMUSCULAR | Status: DC | PRN
  Administered 2019-09-21: 4 mg via INTRAVENOUS

## 2019-09-21 MED ORDER — LIDOCAINE HCL (CARDIAC) PF 100 MG/5ML IV SOSY
PREFILLED_SYRINGE | INTRAVENOUS | Status: DC | PRN
  Administered 2019-09-21: 60 mg via INTRAVENOUS

## 2019-09-21 MED ORDER — PROPOFOL 10 MG/ML IV BOLUS
INTRAVENOUS | Status: DC | PRN
  Administered 2019-09-21: 150 mg via INTRAVENOUS

## 2019-09-21 MED ORDER — LIDOCAINE-EPINEPHRINE 1 %-1:100000 IJ SOLN
INTRAMUSCULAR | Status: DC | PRN
  Administered 2019-09-21: 8 mL

## 2019-09-21 SURGICAL SUPPLY — 57 items
"PENCIL ELECTRO HAND CTR " (MISCELLANEOUS) ×1 IMPLANT
ADAPTER IRRIG TUBE 2 SPIKE SOL (ADAPTER) ×6 IMPLANT
BLADE SURG SZ11 CARB STEEL (BLADE) ×3 IMPLANT
BNDG ELASTIC 6X5.8 VLCR STR LF (GAUZE/BANDAGES/DRESSINGS) ×3 IMPLANT
BNDG ESMARK 6X12 TAN STRL LF (GAUZE/BANDAGES/DRESSINGS) ×3 IMPLANT
BUR RADIUS 3.5 (BURR) ×3 IMPLANT
BUR RADIUS 4.0X18.5 (BURR) ×3 IMPLANT
CAST PADDING 6X4YD ST 30248 (SOFTGOODS) ×2
CHLORAPREP W/TINT 26 (MISCELLANEOUS) ×3 IMPLANT
CLOSURE WOUND 1/2 X4 (GAUZE/BANDAGES/DRESSINGS) ×1
COOLER POLAR GLACIER W/PUMP (MISCELLANEOUS) ×3 IMPLANT
COVER WAND RF STERILE (DRAPES) ×3 IMPLANT
CUFF TOURN SGL QUICK 24 (TOURNIQUET CUFF)
CUFF TOURN SGL QUICK 30 (TOURNIQUET CUFF) ×2
CUFF TRNQT CYL 24X4X16.5-23 (TOURNIQUET CUFF) IMPLANT
CUFF TRNQT CYL 30X4X21-28X (TOURNIQUET CUFF) IMPLANT
DEVICE SUCT BLK HOLE OR FLOOR (MISCELLANEOUS) ×1 IMPLANT
DRAPE C-ARM XRAY 36X54 (DRAPES) IMPLANT
DRAPE LEGGINS SURG 28X43 STRL (DRAPES) ×1 IMPLANT
DRAPE SPLIT 6X30 W/TAPE (DRAPES) ×3 IMPLANT
ELECT REM PT RETURN 9FT ADLT (ELECTROSURGICAL)
ELECTRODE REM PT RTRN 9FT ADLT (ELECTROSURGICAL) ×1 IMPLANT
GAUZE SPONGE 4X4 12PLY STRL (GAUZE/BANDAGES/DRESSINGS) ×3 IMPLANT
GLOVE BIOGEL PI IND STRL 8 (GLOVE) ×1 IMPLANT
GLOVE BIOGEL PI INDICATOR 8 (GLOVE) ×2
GLOVE SURG ORTHO 8.0 STRL STRW (GLOVE) ×6 IMPLANT
GOWN STRL REUS W/ TWL LRG LVL3 (GOWN DISPOSABLE) ×1 IMPLANT
GOWN STRL REUS W/ TWL XL LVL3 (GOWN DISPOSABLE) ×1 IMPLANT
GOWN STRL REUS W/TWL LRG LVL3 (GOWN DISPOSABLE) ×2
GOWN STRL REUS W/TWL XL LVL3 (GOWN DISPOSABLE) ×2
IV LACTATED RINGER IRRG 3000ML (IV SOLUTION) ×10
IV LR IRRIG 3000ML ARTHROMATIC (IV SOLUTION) ×4 IMPLANT
KIT TURNOVER KIT A (KITS) ×3 IMPLANT
MANIFOLD NEPTUNE II (INSTRUMENTS) ×3 IMPLANT
MAT ABSORB  FLUID 56X50 GRAY (MISCELLANEOUS) ×2
MAT ABSORB FLUID 56X50 GRAY (MISCELLANEOUS) ×2 IMPLANT
NDL MAYO CATGUT SZ5 (NEEDLE)
NDL SUT 5 .5 CRC TPR PNT MAYO (NEEDLE) IMPLANT
NEEDLE HYPO 22GX1.5 SAFETY (NEEDLE) ×3 IMPLANT
PACK ARTHROSCOPY KNEE (MISCELLANEOUS) ×3 IMPLANT
PAD ABD DERMACEA PRESS 5X9 (GAUZE/BANDAGES/DRESSINGS) ×3 IMPLANT
PAD WRAPON POLAR KNEE (MISCELLANEOUS) ×1 IMPLANT
PADDING CAST COTTON 6X4 ST (SOFTGOODS) ×1 IMPLANT
PENCIL ELECTRO HAND CTR (MISCELLANEOUS) IMPLANT
SET TUBE SUCT SHAVER OUTFL 24K (TUBING) ×3 IMPLANT
SET TUBE TIP INTRA-ARTICULAR (MISCELLANEOUS) ×3 IMPLANT
STRIP CLOSURE SKIN 1/2X4 (GAUZE/BANDAGES/DRESSINGS) ×2 IMPLANT
SUT ETHILON 3-0 FS-10 30 BLK (SUTURE) ×3
SUT MNCRL AB 4-0 PS2 18 (SUTURE) ×3 IMPLANT
SUT VIC AB 0 CT2 27 (SUTURE) IMPLANT
SUT VIC AB 2-0 CT2 27 (SUTURE) IMPLANT
SUTURE EHLN 3-0 FS-10 30 BLK (SUTURE) ×1 IMPLANT
TOWEL OR 17X26 4PK STRL BLUE (TOWEL DISPOSABLE) ×4 IMPLANT
TUBING ARTHRO INFLOW-ONLY STRL (TUBING) ×3 IMPLANT
WAND HAND CNTRL MULTIVAC 50 (MISCELLANEOUS) ×1 IMPLANT
WAND WEREWOLF FLOW 90D (MISCELLANEOUS) ×3 IMPLANT
WRAPON POLAR PAD KNEE (MISCELLANEOUS) ×3

## 2019-09-21 NOTE — Progress Notes (Signed)
RN contacted Dr. Posey Pronto regarding patients pain level. Patient has had multiple medications to manage pain since surgery and had a nerve block. Patient is complaining of pain rating 5/10 currently. Dr. Posey Pronto aware and asked to reinforce teaching of taking prescribed medications once block starts to wear off. Informed Dr. Posey Pronto that she can distinguish between hot and cold and can feel RN touching lower leg.

## 2019-09-21 NOTE — Transfer of Care (Signed)
Immediate Anesthesia Transfer of Care Note  Patient: Gail [name]  Procedure(s) Performed: KNEE ARTHROSCOPY WITH LATERAL RELEASE, MEDIAL MENISECTOMY, HOFFA'S PAD DEBRIDEMENT (Right Knee)  Patient Location: PACU  Anesthesia Type:General  Level of Consciousness: drowsy  Airway & Oxygen Therapy: Patient Spontanous Breathing and Patient connected to face mask oxygen  Post-op Assessment: Report given to RN and Post -op Vital signs reviewed and stable  Post vital signs: Reviewed and stable  Last Vitals:  Vitals Value Taken Time  BP 170/77 09/21/19 1202  Temp 36.3 C 09/21/19 1159  Pulse 71 09/21/19 1203  Resp 12 09/21/19 1203  SpO2 100 % 09/21/19 1203  Vitals shown include unvalidated device data.  Last Pain:  Vitals:   09/21/19 1159  TempSrc:   PainSc: Asleep         Complications: No apparent anesthesia complications

## 2019-09-21 NOTE — Anesthesia Post-op Follow-up Note (Signed)
Anesthesia QCDR form completed.        

## 2019-09-21 NOTE — Anesthesia Preprocedure Evaluation (Addendum)
Anesthesia Evaluation  Patient identified by MRN, date of birth, ID band Patient awake    Reviewed: Allergy & Precautions, H&P , NPO status , Patient's Chart, lab work & pertinent test results  History of Anesthesia Complications (+) PONV and history of anesthetic complications  Airway Mallampati: II  TM Distance: >3 FB Neck ROM: full    Dental   Crowns:   Pulmonary neg pulmonary ROS, neg shortness of breath, neg COPD, neg recent URI,           Cardiovascular (-) angina(-) Past MI and (-) Cardiac Stents negative cardio ROS  (-) dysrhythmias      Neuro/Psych Since her fall, she repots back pain, and numbness/tingling from right lower back all the way down right leg negative psych ROS   GI/Hepatic negative GI ROS, Neg liver ROS,   Endo/Other  negative endocrine ROS  Renal/GU stones     Musculoskeletal   Abdominal   Peds  Hematology  (+) Blood dyscrasia, anemia ,   Anesthesia Other Findings Past Medical History: 10/08/2013: Abdominal pain     Comment:  Last Assessment & Plan:  Abdominal pain seems               multifactorial.  There is a component of pelvic floor               myalgia on exam.  Her symptoms are also consistent with               possible acute GI viral illness.  No evidence of acute               emergent pathology to explain pain.  Advised supportive               measures for GI symptoms and pain control with oral               medications.  As noted in separate problem, renal stone               may also be contributing.  Will follow up in 1-2 weeks to              assess progress of symptoms.    03/24/2015: Abnormal uterine bleeding     Comment:  Last Assessment & Plan:  EMB pathology results still               pending.  Plan to follow up with Dr Tressie Ellis as previously               planned once results are available.  No bleeding concerns              today.    No date: Anemia 01/25/2015:  Calculus of kidney     Comment:  Last Assessment & Plan:  While her abdominal pain               symptoms are not entirely consistent with the presence of              her kidney stone, it is a possible contributor.  Rx               provided for motrin and tamulosin to aid with passage of               stone.    04/06/2015: Chronic pelvic pain in female No date: Complication of anesthesia     Comment:  n/v 04/06/2015: Dermoid cyst of ovary  Comment:  right  10/08/2013: Episode of syncope No date: PONV (postoperative nausea and vomiting)  Past Surgical History: No date: ABDOMINAL HYSTERECTOMY No date: CESAREAN SECTION 04/15/2015: CYSTOSCOPY; N/A     Comment:  Procedure: CYSTOSCOPY;  Surgeon: Boykin Nearing,               MD;  Location: [facility] ORS;  Service: Gynecology;                Laterality: N/A; 12/28/2015: CYSTOSCOPY WITH RETROGRADE PYELOGRAM, URETEROSCOPY AND  STENT PLACEMENT; Left     Comment:  Procedure: [place],               URETEROSCOPY AND STENT PLACEMENT;  Surgeon: Festus Aloe, MD;  Location: [facility] OR[facility];  Service: Urology;                Laterality: Left; 01/18/2016: CYSTOSCOPY WITH STENT PLACEMENT; Left     Comment:  Procedure: CYSTOSCOPY WITH STENT REMOVAL;  Surgeon:               Nickie Retort, MD;  Location: [facility];  Service:               Urology;  Laterality: Left; No date: HAND TENODESIS 04/15/2015: LAPAROSCOPIC ASSISTED VAGINAL HYSTERECTOMY; N/A     Comment:  Procedure: LAPAROSCOPIC ASSISTED VAGINAL HYSTERECTOMY;                Surgeon: Boykin Nearing, MD;  Location: [facility] OR[facility];               Service: Gynecology;  Laterality: N/A; 04/15/2015: LAPAROSCOPIC SALPINGO OOPHERECTOMY; Bilateral     Comment:  Procedure: LAPAROSCOPIC SALPINGO OOPHORECTOMY;  Surgeon:              Boykin Nearing, MD;  Location: [facility] OR[facility];  Service:              Gynecology;  Laterality: Bilateral; 01/18/2016: URETEROSCOPY  WITH HOLMIUM LASER LITHOTRIPSY; Left     Comment:  Procedure: URETEROSCOPY WITH STONE EXTRACTION;  Surgeon:              Nickie Retort, MD;  Location: [facility];  Service:               Urology;  Laterality: Left;  BMI    Body Mass Index: 21.25 kg/m      Reproductive/Obstetrics negative OB ROS                            Anesthesia Physical  Anesthesia Plan  ASA: II  Anesthesia Plan: General ETT   Post-op Pain Management:    Induction:   PONV Risk Score and Plan: Ondansetron, Dexamethasone, Midazolam and Treatment may vary due to age or medical condition  Airway Management Planned:   Additional Equipment:   Intra-op Plan:   Post-operative Plan:   Informed Consent: I have reviewed the patients History and Physical, chart, labs and discussed the procedure including the risks, benefits and alternatives for the proposed anesthesia with the patient or authorized representative who has indicated his/her understanding and acceptance.     Dental Advisory Given  Plan Discussed with: Anesthesiologist and CRNA  Anesthesia Plan Comments: (Consented for post op PRN adductor canal block.  KLF)       Anesthesia Quick Evaluation

## 2019-09-21 NOTE — Discharge Instructions (Signed)
Arthroscopic Knee Surgery   Post-Op Instructions   1. Bracing or crutches: Crutches will be provided at the time of discharge from the surgery center if you do not already have them.   2. Ice: You may be provided with a device [facility]) that allows you to ice the affected area effectively. Otherwise you can ice manually.    3. Driving:  Plan on not driving for at least one week. Please note that you are advised NOT to drive while taking narcotic pain medications as you may be impaired and unsafe to drive.   4. Activity: Ankle pumps several times an hour while awake to prevent blood clots. Weight bearing: as tolerated. Use crutches for as needed (usually ~1 week or less) until pain allows you to ambulate without a limp. Bending and straightening the knee is unlimited. Elevate knee above heart level as much as possible for one week. Avoid standing more than 5 minutes (consecutively) for the first week.  Avoid long distance travel for 2 weeks.   5. Medications:  - You have been provided a prescription for narcotic pain medicine. After surgery, take 1-2 narcotic tablets every 4 hours if needed for severe pain.  - You may take up to 3000mg /day of tylenol (acetaminophen). You can take 1000mg  3x/day. Please check your narcotic. If you have acetaminophen in your narcotic (each tablet will be 325mg ), be careful not to exceed a total of 3000mg /day of acetaminophen.  - A prescription for anti-nausea medication will be provided in case the narcotic medicine causes nausea - take 1 tablet every 6 hours only if nauseated.  - Take ibuprofen 800 mg every 8 hours WITH food to reduce post-operative knee swelling. DO NOT STOP IBUPROFEN POST-OP UNTIL INSTRUCTED TO DO SO at first post-op office visit (10-14 days after surgery). However, please discontinue if you have any abdominal discomfort after taking this.  - Take enteric coated aspirin 325 mg once daily for 2 weeks to prevent blood clots.   6. Bandages: The  physical therapist should change the bandages at the first post-op appointment. If needed, the dressing supplies have been provided to you.   7. Physical Therapy: 1-2 times per week for 6 weeks. Therapy typically starts on post operative Day 3 or 4. You have been provided an order for physical therapy. The therapist will provide home exercises.   8. Work: May return to full work usually around 2 weeks after 1st post-operative visit. May do light duty/desk job in approximately 1-2 weeks when off of narcotics, pain is well-controlled, and swelling has decreased. Labor intensive jobs may require up to 6-12 weeks to return.      9. Post-Op Appointments: Your first post-op appointment will be with Dr. Posey Pronto in approximately 2 weeks time.    If you find that they have not been scheduled please call the Orthopaedic Appointment front desk at [phone].     AMBULATORY SURGERY  DISCHARGE INSTRUCTIONS  1) The drugs that you were given will stay in your system until tomorrow so for the next 24 hours you should not: A) Drive an automobile B) Make any legal decisions C) Drink any alcoholic beverage  2) You may resume regular meals tomorrow.  Today it is better to start with liquids and gradually work up to solid foods. You may eat anything you prefer, but it is better to start with liquids, then soup and crackers, and gradually work up to solid foods.  3) Please notify your doctor immediately if you have  any unusual bleeding, trouble breathing, redness and pain at the surgery site, drainage, fever, or pain not relieved by medication.  4) Additional Instructions:   Please contact your physician with any problems or Same Day Surgery at [phone], Monday through Friday 6 am to 4 pm, or [place] at [facility] number at [phone].

## 2019-09-21 NOTE — Anesthesia Procedure Notes (Signed)
Procedure Name: Intubation Date/Time: 09/21/2019 10:36 AM Performed by: Jerrye Noble, CRNA Pre-anesthesia Checklist: Patient identified, Emergency Drugs available, Suction available, Patient being monitored and Timeout performed Patient Re-evaluated:Patient Re-evaluated prior to induction Oxygen Delivery Method: Circle system utilized Preoxygenation: Pre-oxygenation with 100% oxygen Induction Type: IV induction Ventilation: Mask ventilation without difficulty Laryngoscope Size: Mac and 3 Grade View: Grade I Tube type: Oral Tube size: 7.0 mm Number of attempts: 1 Airway Equipment and Method: Stylet Placement Confirmation: ETT inserted through vocal cords under direct vision,  positive ETCO2 and breath sounds checked- equal and bilateral Secured at: 21 cm Tube secured with: Tape Dental Injury: Teeth and Oropharynx as per pre-operative assessment

## 2019-09-21 NOTE — Anesthesia Procedure Notes (Signed)
Anesthesia Regional Block: Adductor canal block   Pre-Anesthetic Checklist: ,, timeout performed, Correct Patient, Correct Site, Correct Laterality, Correct Procedure, Correct Position, site marked, Risks and benefits discussed, Surgical consent,  Pre-op evaluation,  Post-op pain management  Laterality: Right  Prep: chloraprep       Needles:  Injection technique: Single-shot  Needle Type: Stimiplex     Needle Length: 9cm  Needle Gauge: 21     Additional Needles:   Procedures:,,,, ultrasound used (permanent image in chart),,,,  Narrative:  Start time: 09/21/2019 1:15 PM End time: 09/21/2019 1:19 PM  Performed by: Personally  Anesthesiologist: Durenda Hurt, MD  Additional Notes: Negative aspiration.  Negative paresthesia on injection.  Dose given in divided aliquots under ultrasound guidance.

## 2019-09-21 NOTE — Progress Notes (Signed)
Reassessed patients pain level. Patient states pain is 5/10 and pain was over 10/10 prior to surgery. This 5/10 is tolerable for her and she feels like she is ready to go home. RN to assist her to get dressed.

## 2019-09-21 NOTE — Op Note (Signed)
Operative Note   SURGERY DATE: 09/21/2019  PRE-OP DIAGNOSIS:  1.  Right knee lateral meniscus tear 2.  Right knee synovitis 3.  Right knee thickened Hoffa's fat pad  POST-OP DIAGNOSIS: 1. Right knee synovitis 2. Right knee thickened Hoffa's fat pad 3.  Right knee lateral compartment degenerative changes  PROCEDURES:  1. Right knee arthroscopy with partial synovectomy 2.  Right knee arthroscopic chondroplasty of the lateral tibial plateau  SURGEON: Cato Mulligan, MD  ANESTHESIA: Gen  ESTIMATED BLOOD LOSS:minimal  TOTAL IV FLUIDS: per anesthesia  INDICATION(S):  Gail [name] is a 48 y.o. female who twisted her knee approximately 4 months ago. She had failed nonoperative management in the form of activity modifications, medications, and corticosteroid injection without improvement in her symptoms.  MRI was suggestive of possible lateral meniscus tear, synovitic changes about the knee and a thickened Hoffa's fat pad.herefore, after discussion of risks, benefits, and alternatives to surgery, the patient elected to proceed.  OPERATIVE FINDINGS:   Examination under anesthesia:A careful examination under anesthesia was performed. Passive range of motion was: Hyperextension: 2. Extension: 0. Flexion: 140. Lachman: normal. Pivot Shift: normal. Posterior drawer: normal. Varus stability in full extension: normal. Varus stability in 30 degrees of flexion: normal. Valgus stability in full extension: normal. Valgus stability in 30 degrees of flexion: normal.  Intra-operative findings:A thorough arthroscopic examination of the knee was performed. The findings are: 1. Suprapatellar pouch: Mild synovitic changes 2. Undersurface of median ridge: Grade 1 softening 3. Medial patellar facet: Normal  4. Lateral patellar facet: Normal 5. Trochlea: Normal 6. Lateral gutter/popliteus tendon: Normal  7. Hoffa's fat pad: Inflamed and thickened, especially laterally with  some patellofemoral impingement 8. Medial gutter/plica: Normal 9. ACL: Intact and normal except synovitic changes about the ACL 10. PCL: Normal 11. Medial meniscus: Normal 12. Medial compartment cartilage: Normal 13. Lateral meniscus: Normal 14. Lateral compartment cartilage: Grade 2 degenerative changes of the tibial plateau  OPERATIVE REPORT:   I identified Gail [name] the pre-operative holding area. I marked theoperativeknee with my initials. I reviewed the risks and benefits of the proposed surgical intervention and the patient (and/or patient's guardian) wished to proceed. The patient was transferred to the operative suite and placed in the supine position with all bony prominences padded. Anesthesia was administered. Appropriate IV antibioticswere administered prior to incision. The extremity was then prepped and draped in standard fashion. A time out was performed confirming the correct extremity, correct patient, and correct procedure.  Local anesthetic was injected to the planned portal sites. The anterolateral portalwasestablished with an 11blade.   The arthroscope was placed in the anterolateral portal and theninto the suprapatellar pouch. A diagnostic knee scope was completed with the above findings.   Next the medial portal was established under needle localization.  First, partial synovectomy was performed.  Hoffa's fat pad was thoroughly debrided with a combination of an oscillating shaver and ArthroCare wand such that there was no significant remaining impingement.  Additionally partial synovectomy of the patellofemoral compartment was performed using commendation of ArthroCare wand and oscillating shaver.  Finally, areas around the ACL that were synovitic were debrided with an ArthroCare wand.  A gentle chondroplasty of the lateral tibial plateau was performed with an ArthroCare wand such that there were no loose flaps of cartilage with stable cartilage  edges.  The menisci were extensively debrided and probed, and a 70 degree arthroscope was used to evaluate the lateral meniscus as well, confirming no tear to either meniscus.  The  joint was then drained of fluid.   Fluoroscopy was used to visualize the proximal tibia. A gentle manipulation under anesthesia was performed. Post-manipulation range of motion was:  The portals were closed with 4-0 Nylon suture.  Local anesthetic was injected into the joint.  Sterile dressings included Xeroform, 4x4s, Sof-Rol, and Bias wrap. A Polarcare was placed.  The patient was then awakened and taken to the PACU hemodynamically stable without complication.   POSTOPERATIVE PLAN: The patient will be discharged home today once they meet PACU criteria. Aspirin 325 mg daily was prescribed for 2 weeks for DVT prophylaxis. Physical therapy will start on POD#3-4 and continue 2 times weekly for at least 6 weeks.Weight-bearing as tolerated. Follow up as outpatient in 2 weeks.

## 2019-09-21 NOTE — H&P (Signed)
Paper H&P to be scanned into permanent record. H&P reviewed. No significant changes noted.  

## 2019-09-22 ENCOUNTER — Encounter: Payer: Self-pay | Admitting: Orthopedic Surgery

## 2019-09-22 NOTE — Anesthesia Postprocedure Evaluation (Signed)
Anesthesia Post Note  Patient: Gail [name]  Procedure(s) Performed: KNEE ARTHROSCOPY WITH LATERAL RELEASE, MEDIAL MENISECTOMY, HOFFA'S PAD DEBRIDEMENT (Right Knee)  Patient location during evaluation: PACU Anesthesia Type: General Level of consciousness: awake and alert Pain management: pain level controlled Vital Signs Assessment: post-procedure vital signs reviewed and stable Respiratory status: spontaneous breathing, nonlabored ventilation and respiratory function stable Cardiovascular status: blood pressure returned to baseline and stable Postop Assessment: no apparent nausea or vomiting Anesthetic complications: no     Last Vitals:  Vitals:   09/21/19 1534 09/21/19 1641  BP: (!) 167/80 (!) 147/81  Pulse: 67 72  Resp: 16 16  Temp: (!) 36.3 C 36.6 C  SpO2: 100% 100%    Last Pain:  Vitals:   09/21/19 1641  TempSrc:   PainSc: [place]

## 2020-01-14 ENCOUNTER — Other Ambulatory Visit: Payer: Self-pay | Admitting: Internal Medicine

## 2020-01-14 DIAGNOSIS — Z1231 Encounter for screening mammogram for malignant neoplasm of breast: Secondary | ICD-10-CM

## 2020-01-14 DIAGNOSIS — M5441 Lumbago with sciatica, right side: Secondary | ICD-10-CM | POA: Insufficient documentation

## 2020-01-14 DIAGNOSIS — S83281A Other tear of lateral meniscus, current injury, right knee, initial encounter: Secondary | ICD-10-CM | POA: Insufficient documentation

## 2020-01-14 DIAGNOSIS — M25561 Pain in right knee: Secondary | ICD-10-CM | POA: Insufficient documentation

## 2020-01-14 DIAGNOSIS — M549 Dorsalgia, unspecified: Secondary | ICD-10-CM | POA: Insufficient documentation

## 2020-03-29 ENCOUNTER — Encounter: Payer: Self-pay | Admitting: *Deleted

## 2020-03-29 ENCOUNTER — Other Ambulatory Visit: Payer: Self-pay

## 2020-03-29 ENCOUNTER — Emergency Department: Payer: Federal, State, Local not specified - PPO

## 2020-03-29 DIAGNOSIS — R0602 Shortness of breath: Secondary | ICD-10-CM | POA: Diagnosis not present

## 2020-03-29 DIAGNOSIS — M549 Dorsalgia, unspecified: Secondary | ICD-10-CM | POA: Diagnosis present

## 2020-03-29 DIAGNOSIS — Z79899 Other long term (current) drug therapy: Secondary | ICD-10-CM | POA: Diagnosis not present

## 2020-03-29 DIAGNOSIS — M5416 Radiculopathy, lumbar region: Secondary | ICD-10-CM | POA: Diagnosis not present

## 2020-03-29 LAB — CBC
HCT: 40.5 % (ref 36.0–46.0)
Hemoglobin: 13.6 g/dL (ref 12.0–15.0)
MCH: 29.1 pg (ref 26.0–34.0)
MCHC: 33.6 g/dL (ref 30.0–36.0)
MCV: 86.7 fL (ref 80.0–100.0)
Platelets: 265 10*3/uL (ref 150–400)
RBC: 4.67 MIL/uL (ref 3.87–5.11)
RDW: 13.1 % (ref 11.5–15.5)
WBC: 7.4 10*3/uL (ref 4.0–10.5)
nRBC: 0 % (ref 0.0–0.2)

## 2020-03-29 LAB — BASIC METABOLIC PANEL
Anion gap: 10 (ref 5–15)
BUN: 14 mg/dL (ref 6–20)
CO2: 27 mmol/L (ref 22–32)
Calcium: 9.8 mg/dL (ref 8.9–10.3)
Chloride: 104 mmol/L (ref 98–111)
Creatinine, Ser: 0.66 mg/dL (ref 0.44–1.00)
GFR calc Af Amer: >60 mL/min (ref >60)
GFR calc non Af Amer: >60 mL/min (ref >60)
Glucose, Bld: 100 mg/dL — ABNORMAL HIGH (ref 70–99)
Potassium: 3.8 mmol/L (ref 3.5–5.1)
Sodium: 141 mmol/L (ref 135–145)

## 2020-03-29 LAB — TROPONIN I (HIGH SENSITIVITY): Troponin I (High Sensitivity): 4 ng/L (ref <18)

## 2020-03-29 MED ORDER — SODIUM CHLORIDE 0.9% FLUSH: 3.0000 mL | Freq: Once | INTRAVENOUS | Status: DC

## 2020-03-29 NOTE — ED Triage Notes (Signed)
Pt ambulatory to triage.  Pt has lower back pain and has sob.  No chest pain.  No distress in triage.  No n/v/d.  Non smoker.  No cough.  Pt alert  Speech clear.

## 2020-03-30 ENCOUNTER — Emergency Department
Admission: EM | Admit: 2020-03-30 | Discharge: 2020-03-30 | Disposition: A | Discharge status: Home / Self Care | Payer: Federal, State, Local not specified - PPO | Attending: Emergency Medicine | Admitting: Emergency Medicine

## 2020-03-30 DIAGNOSIS — M5416 Radiculopathy, lumbar region: Secondary | ICD-10-CM

## 2020-03-30 LAB — URINALYSIS, COMPLETE (UACMP) WITH MICROSCOPIC
Bacteria, UA: NONE SEEN
Bilirubin Urine: NEGATIVE
Glucose, UA: NEGATIVE mg/dL
Hgb urine dipstick: NEGATIVE
Ketones, ur: NEGATIVE mg/dL
Leukocytes,Ua: NEGATIVE
Nitrite: NEGATIVE
Protein, ur: NEGATIVE mg/dL
Specific Gravity, Urine: 1.006 (ref 1.005–1.030)
Squamous Epithelial / HPF: NONE SEEN (ref 0–5)
WBC, UA: NONE SEEN WBC/hpf (ref 0–5)
pH: 6 (ref 5.0–8.0)

## 2020-03-30 MED ORDER — ONDANSETRON 4 MG PO TBDP: 4.0000 mg | ORAL_TABLET | Freq: Three times a day (TID) | ORAL | 0 refills | Status: DC | PRN

## 2020-03-30 MED ORDER — KETOROLAC TROMETHAMINE 30 MG/ML IJ SOLN
30.0000 mg | Freq: Once | INTRAMUSCULAR | Status: AC
  Administered 2020-03-30: 30 mg via INTRAMUSCULAR
  Filled 2020-03-30: qty 1

## 2020-03-30 MED ORDER — CYCLOBENZAPRINE HCL 10 MG PO TABS
10.0000 mg | ORAL_TABLET | Freq: Three times a day (TID) | ORAL | 0 refills | Status: DC | PRN
Start: 2020-03-30 — End: 2020-06-13

## 2020-03-30 MED ORDER — ONDANSETRON 4 MG PO TBDP: 4.0000 mg | ORAL_TABLET | Freq: Once | ORAL | Status: AC

## 2020-03-30 MED ORDER — LORATADINE 10 MG PO TABS
10.0000 mg | ORAL_TABLET | Freq: Every day | ORAL | 0 refills | Status: AC
Start: 2020-03-30 — End: 2021-03-30

## 2020-03-30 MED ORDER — ONDANSETRON 4 MG PO TBDP
ORAL_TABLET | ORAL | Status: AC
  Administered 2020-03-30: 4 mg via ORAL
  Filled 2020-03-30: qty 1

## 2020-03-30 MED ORDER — CYCLOBENZAPRINE HCL 10 MG PO TABS
10.0000 mg | ORAL_TABLET | Freq: Once | ORAL | Status: AC
  Administered 2020-03-30: 10 mg via ORAL
  Filled 2020-03-30: qty 1

## 2020-03-30 NOTE — ED Notes (Signed)
Pt reporting nausea. When asked how long she has had this she reported multiple days. Pt denies feeling capable of taking PO medication without nausea medication first. MD made aware.

## 2020-03-30 NOTE — ED Provider Notes (Signed)
[facility] Emergency Department Provider Note  ____________________________________________   First MD Initiated Contact with Patient 03/30/20 0001     (approximate)  I have reviewed the triage vital signs and the nursing notes.   HISTORY  Chief Complaint Back Pain and Shortness of Breath    HPI Gail [name] is a 49 y.o. female with below list of previous medical conditions presents to the emergency department secondary to chronic low back pain x1 year status post transforaminal epidural steroid injection x3 presents to the emergency department secondary to worsening low pain following her third injection on 03/14/2020.  Patient denies any fever.  Patient denies any lower extremity weakness numbness.  Patient was seen by physical medicine rehab yesterday and was informed that she would be referred to a pain management clinic.  Patient states her current pain score is 10 out of 10 worse with movement and walking.        Past Medical History:  Diagnosis Date  . Abdominal pain 10/08/2013   Last Assessment & Plan:  Abdominal pain seems multifactorial.  There is a component of pelvic floor myalgia on exam.  Her symptoms are also consistent with possible acute GI viral illness.  No evidence of acute emergent pathology to explain pain.  Advised supportive measures for GI symptoms and pain control with oral medications.  As noted in separate problem, renal stone may also be contributing.  Will follow up in 1-2 weeks to assess progress of symptoms.     . Abnormal uterine bleeding 03/24/2015   Last Assessment & Plan:  EMB pathology results still pending.  Plan to follow up with Dr Tressie Ellis as previously planned once results are available.  No bleeding concerns today.     . Anemia   . Calculus of kidney 01/25/2015   Last Assessment & Plan:  While her abdominal pain symptoms are not entirely consistent with the presence of her kidney stone, it is a possible  contributor.  Rx provided for motrin and tamulosin to aid with passage of stone.     . Chronic pelvic pain in female 04/06/2015  . 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 Episode of syncope 10/08/2013  . Knee pain     Patient Active Problem List   Diagnosis Date Noted  . Pancreatitis 08/06/2019  . Post-operative state 04/15/2015  . Menorrhagia 04/06/2015  . Chronic pelvic pain in female 04/06/2015  . 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 Dyspareunia 04/06/2015  . Abnormal uterine bleeding 03/24/2015  . Calculus of kidney 01/25/2015  . 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 Abdominal pain 10/08/2013  . Episode of syncope 10/08/2013  . Irregular bleeding 02/25/2013    Past Surgical History:  Procedure Laterality Date  . ABDOMINAL HYSTERECTOMY    . CESAREAN SECTION    . CHOLECYSTECTOMY N/A 08/07/2019   Procedure: LAPAROSCOPIC CHOLECYSTECTOMY;  Surgeon: Herbert Pun, MD;  Location: [facility] OR[facility];  Service: General;  Laterality: N/A;  . CYSTOSCOPY N/A 04/15/2015   Procedure: Consuela Mimes;  Surgeon: Boykin Nearing, MD;  Location: [facility];  Service: Gynecology;  Laterality: N/A;  . CYSTOSCOPY WITH RETROGRADE PYELOGRAM, URETEROSCOPY AND STENT PLACEMENT Left 12/28/2015   Procedure: CYSTOSCOPY WITH RETROGRADE PYELOGRAM, URETEROSCOPY AND STENT PLACEMENT;  Surgeon: Festus Aloe, MD;  Location: [facility];  Service: Urology;  Laterality: Left;  . CYSTOSCOPY WITH STENT PLACEMENT Left 01/18/2016   Procedure: CYSTOSCOPY WITH STENT REMOVAL;  Surgeon: Nickie Retort, MD;  Location: [facility];  Service:  Urology;  Laterality: Left;  . HAND TENODESIS    . KNEE ARTHROSCOPY WITH LATERAL RELEASE Right 09/21/2019   Procedure: KNEE ARTHROSCOPY WITH LATERAL RELEASE, MEDIAL MENISECTOMY, HOFFA'S PAD DEBRIDEMENT;  Surgeon: Leim Fabry, MD;  Location: [facility];  Service: Orthopedics;  Laterality: Right;  . LAPAROSCOPIC ASSISTED VAGINAL HYSTERECTOMY N/A 04/15/2015   Procedure: LAPAROSCOPIC ASSISTED VAGINAL  HYSTERECTOMY;  Surgeon: Boykin Nearing, MD;  Location: [facility];  Service: Gynecology;  Laterality: N/A;  . LAPAROSCOPIC SALPINGO OOPHERECTOMY Bilateral 04/15/2015   Procedure: LAPAROSCOPIC SALPINGO OOPHORECTOMY;  Surgeon: Boykin Nearing, MD;  Location: [facility];  Service: Gynecology;  Laterality: Bilateral;  . URETEROSCOPY WITH HOLMIUM LASER LITHOTRIPSY Left 01/18/2016   Procedure: URETEROSCOPY WITH STONE EXTRACTION;  Surgeon: Nickie Retort, MD;  Location: [facility];  Service: Urology;  Laterality: Left;    Prior to Admission medications   Medication Sig Start Date End Date Taking? Authorizing Provider  acetaminophen (TYLENOL) 500 MG tablet Take 2 tablets (1,000 mg total) by mouth every 8 (eight) hours. 09/21/19 09/20/20  Leim Fabry, MD  Ascorbic Acid (VITAMIN C) 1000 MG tablet Take 1,000 mg by mouth daily.    [provider]  Multiple Vitamin (MULTIVITAMIN WITH MINERALS) TABS tablet Take 1 tablet by mouth daily.    [provider]  ondansetron (ZOFRAN ODT) 4 MG disintegrating tablet Take 1 tablet (4 mg total) by mouth every 8 (eight) hours as needed for nausea or vomiting. 09/21/19   Leim Fabry, MD  traMADol (ULTRAM) 50 MG tablet Take 1 tablet (50 mg total) by mouth every 4 (four) hours as needed. 09/21/19 09/20/20  Leim Fabry, MD    Allergies Ciprodex [ciprofloxacin-dexamethasone], Hydrocodone, Oxycodone, and Sulfamethoxazole-trimethoprim  No family history on file.  Social History Social History   Tobacco Use  . Smoking status: Never Smoker  . Smokeless tobacco: Never Used  Substance Use Topics  . Alcohol use: No  . Drug use: No    Review of Systems Constitutional: No fever/chills Eyes: No visual changes. ENT: No sore throat. Cardiovascular: Denies chest pain. Respiratory: Denies shortness of breath. Gastrointestinal: No abdominal pain.  No nausea, no vomiting.  No diarrhea.  No constipation. Genitourinary: Negative for  dysuria. Musculoskeletal: Negative for neck pain.  Positive for back pain. Integumentary: Negative for rash. Neurological: Negative for headaches, focal weakness or numbness.  ____________________________________________   PHYSICAL EXAM:  VITAL SIGNS: ED Triage Vitals  Enc Vitals Group     BP 03/29/20 2049 140/80     Pulse Rate 03/29/20 2049 78     Resp 03/29/20 2049 18     Temp 03/29/20 2049 98.3 F (36.8 C)     Temp Source 03/29/20 2049 Oral     SpO2 03/29/20 2049 99 %     Weight 03/29/20 2050 54.4 kg (120 lb)     Height 03/29/20 2050 1.575 m (5\' 2" )     Head Circumference --      Peak Flow --      Pain Score 03/29/20 2050 10     Pain Loc --      Pain Edu? --      Excl. in [place]? --     Constitutional: Alert and oriented.  Eyes: Conjunctivae are normal.  Mouth/Throat: Patient is wearing a mask. Neck: No stridor.  No meningeal signs.   Cardiovascular: Normal rate, regular rhythm. Good peripheral circulation. Grossly normal heart sounds. Respiratory: Normal respiratory effort.  No retractions. Gastrointestinal: Soft and nontender. No distention.  Musculoskeletal: No lower extremity tenderness  nor edema. No gross deformities of extremities.  Pain with lumbar paraspinal muscle palpation.   Neurologic:  Normal speech and language. No gross focal neurologic deficits are appreciated.  Skin:  Skin is warm, dry and intact. Psychiatric: Mood and affect are normal. Speech and behavior are normal.  ____________________________________________   LABS (all labs ordered are listed, but only abnormal results are displayed)  Labs Reviewed  BASIC METABOLIC PANEL - Abnormal; Notable for the following components:      Result Value   Glucose, Bld 100 (*)    All other components within normal limits  URINALYSIS, COMPLETE (UACMP) WITH MICROSCOPIC - Abnormal; Notable for the following components:   Color, Urine STRAW (*)    APPearance CLEAR (*)    All other components within normal  limits  CBC  TROPONIN I (HIGH SENSITIVITY)    RADIOLOGY I, [place] Sharrieff Spratlin, personally viewed and evaluated these images (plain radiographs) as part of my medical decision making, as well as reviewing the written report by the radiologist.  ED MD interpretation: No acute abnormality noted on chest x-ray per radiologist.  Official radiology report(s): DG Chest 2 View  Result Date: 03/29/2020 CLINICAL DATA:  Shortness of breath and chest pain EXAM: CHEST - 2 VIEW COMPARISON:  03/03/2019 FINDINGS: Cardiac shadows within normal limits. The lungs are well aerated bilaterally. No focal infiltrate or sizable effusion is seen. No bony abnormality is noted. IMPRESSION: No acute abnormality seen. Electronically Signed   By: Inez Catalina M.D.   On: 03/29/2020 21:19     Procedures   ____________________________________________   INITIAL IMPRESSION / MDM / ASSESSMENT AND PLAN / ED COURSE  As part of my medical decision making, I reviewed the following data within the electronic MEDICAL RECORD NUMBER   49 year old female presenting with above-stated history and physical exam differential diagnosis including chronic low back pain secondary to degenerative disc disease less likely kidney stones.  Patient urinalysis unremarkable.   ____________________________________________  FINAL CLINICAL IMPRESSION(S) / ED DIAGNOSES  Final diagnoses:  Lumbar radiculopathy     MEDICATIONS GIVEN DURING THIS VISIT:  Medications  sodium chloride flush (NS) 0.9 % injection 3 mL (3 mLs Intravenous Not Given 03/30/20 0044)  cyclobenzaprine (FLEXERIL) tablet 10 mg (10 mg Oral Given 03/30/20 0054)  ondansetron (ZOFRAN-ODT) disintegrating tablet 4 mg (4 mg Oral Given 03/30/20 0055)     ED Discharge Orders    None      *Please note:  Gail [name] was evaluated in Emergency Department on 03/30/2020 for the symptoms described in the history of present illness. She was evaluated in the context of the global  COVID-19 pandemic, which necessitated consideration that the patient might be at risk for infection with the SARS-CoV-2 virus that causes COVID-19. Institutional protocols and algorithms that pertain to the evaluation of patients at risk for COVID-19 are in a state of rapid change based on information released by regulatory bodies including the CDC and federal and state organizations. These policies and algorithms were followed during the patient's care in the ED.  Some ED evaluations and interventions may be delayed as a result of limited staffing during the pandemic.*  Note:  This document was prepared using Dragon voice recognition software and may include unintentional dictation errors.   Gregor Hams, MD 03/30/20 0130

## 2020-04-12 ENCOUNTER — Other Ambulatory Visit: Payer: Self-pay | Admitting: Internal Medicine

## 2020-04-12 DIAGNOSIS — M5442 Lumbago with sciatica, left side: Secondary | ICD-10-CM

## 2020-04-12 DIAGNOSIS — M5441 Lumbago with sciatica, right side: Secondary | ICD-10-CM

## 2020-04-21 ENCOUNTER — Ambulatory Visit
Admission: RE | Admit: 2020-04-21 | Discharge: 2020-04-21 | Disposition: A | Discharge status: Home / Self Care | Payer: Federal, State, Local not specified - PPO | Source: Ambulatory Visit | Attending: Internal Medicine | Admitting: Internal Medicine

## 2020-04-21 ENCOUNTER — Other Ambulatory Visit: Payer: Self-pay

## 2020-04-21 DIAGNOSIS — M5442 Lumbago with sciatica, left side: Secondary | ICD-10-CM | POA: Insufficient documentation

## 2020-04-21 DIAGNOSIS — M5441 Lumbago with sciatica, right side: Secondary | ICD-10-CM | POA: Diagnosis present

## 2020-05-04 ENCOUNTER — Ambulatory Visit
Admission: RE | Admit: 2020-05-04 | Discharge: 2020-05-04 | Disposition: A | Discharge status: Home / Self Care | Payer: Federal, State, Local not specified - PPO | Attending: Pain Medicine | Admitting: Pain Medicine

## 2020-05-04 ENCOUNTER — Encounter: Payer: Self-pay | Admitting: Pain Medicine

## 2020-05-04 ENCOUNTER — Ambulatory Visit (HOSPITAL_BASED_OUTPATIENT_CLINIC_OR_DEPARTMENT_OTHER): Payer: Federal, State, Local not specified - PPO | Admitting: Pain Medicine

## 2020-05-04 ENCOUNTER — Ambulatory Visit
Admission: RE | Admit: 2020-05-04 | Discharge: 2020-05-04 | Disposition: A | Discharge status: Home / Self Care | Payer: Federal, State, Local not specified - PPO | Source: Ambulatory Visit | Attending: Pain Medicine | Admitting: Pain Medicine

## 2020-05-04 ENCOUNTER — Other Ambulatory Visit
Admission: RE | Admit: 2020-05-04 | Discharge: 2020-05-04 | Disposition: A | Discharge status: Home / Self Care | Payer: Federal, State, Local not specified - PPO | Source: Home / Self Care | Attending: Pain Medicine | Admitting: Pain Medicine

## 2020-05-04 ENCOUNTER — Other Ambulatory Visit: Payer: Self-pay

## 2020-05-04 VITALS — BP 161/80 | HR 62 | Temp 97.7°F | Resp 16 | Ht 62.0 in | Wt 120.0 lb

## 2020-05-04 DIAGNOSIS — M79604 Pain in right leg: Secondary | ICD-10-CM

## 2020-05-04 DIAGNOSIS — Z79899 Other long term (current) drug therapy: Secondary | ICD-10-CM

## 2020-05-04 DIAGNOSIS — Z881 Allergy status to other antibiotic agents status: Secondary | ICD-10-CM | POA: Diagnosis not present

## 2020-05-04 DIAGNOSIS — M5417 Radiculopathy, lumbosacral region: Secondary | ICD-10-CM | POA: Insufficient documentation

## 2020-05-04 DIAGNOSIS — M542 Cervicalgia: Secondary | ICD-10-CM

## 2020-05-04 DIAGNOSIS — M5136 Other intervertebral disc degeneration, lumbar region: Secondary | ICD-10-CM | POA: Insufficient documentation

## 2020-05-04 DIAGNOSIS — M899 Disorder of bone, unspecified: Secondary | ICD-10-CM | POA: Insufficient documentation

## 2020-05-04 DIAGNOSIS — M5412 Radiculopathy, cervical region: Secondary | ICD-10-CM | POA: Insufficient documentation

## 2020-05-04 DIAGNOSIS — M533 Sacrococcygeal disorders, not elsewhere classified: Secondary | ICD-10-CM | POA: Insufficient documentation

## 2020-05-04 DIAGNOSIS — G8929 Other chronic pain: Secondary | ICD-10-CM | POA: Insufficient documentation

## 2020-05-04 DIAGNOSIS — M5441 Lumbago with sciatica, right side: Secondary | ICD-10-CM

## 2020-05-04 DIAGNOSIS — G894 Chronic pain syndrome: Secondary | ICD-10-CM | POA: Insufficient documentation

## 2020-05-04 DIAGNOSIS — R102 Pelvic and perineal pain: Secondary | ICD-10-CM | POA: Insufficient documentation

## 2020-05-04 DIAGNOSIS — M79602 Pain in left arm: Secondary | ICD-10-CM

## 2020-05-04 DIAGNOSIS — M4697 Unspecified inflammatory spondylopathy, lumbosacral region: Secondary | ICD-10-CM

## 2020-05-04 DIAGNOSIS — Z791 Long term (current) use of non-steroidal anti-inflammatories (NSAID): Secondary | ICD-10-CM | POA: Insufficient documentation

## 2020-05-04 DIAGNOSIS — Z7952 Long term (current) use of systemic steroids: Secondary | ICD-10-CM | POA: Diagnosis not present

## 2020-05-04 DIAGNOSIS — Z885 Allergy status to narcotic agent status: Secondary | ICD-10-CM | POA: Insufficient documentation

## 2020-05-04 DIAGNOSIS — R200 Anesthesia of skin: Secondary | ICD-10-CM | POA: Insufficient documentation

## 2020-05-04 DIAGNOSIS — M47816 Spondylosis without myelopathy or radiculopathy, lumbar region: Secondary | ICD-10-CM

## 2020-05-04 DIAGNOSIS — R937 Abnormal findings on diagnostic imaging of other parts of musculoskeletal system: Secondary | ICD-10-CM | POA: Insufficient documentation

## 2020-05-04 DIAGNOSIS — M25512 Pain in left shoulder: Secondary | ICD-10-CM | POA: Diagnosis not present

## 2020-05-04 DIAGNOSIS — Z789 Other specified health status: Secondary | ICD-10-CM

## 2020-05-04 DIAGNOSIS — M79605 Pain in left leg: Secondary | ICD-10-CM | POA: Insufficient documentation

## 2020-05-04 DIAGNOSIS — M25519 Pain in unspecified shoulder: Secondary | ICD-10-CM

## 2020-05-04 LAB — COMPREHENSIVE METABOLIC PANEL WITH GFR
ALT: 20 U/L (ref 0–44)
AST: 23 U/L (ref 15–41)
Albumin: 4.3 g/dL (ref 3.5–5.0)
Alkaline Phosphatase: 72 U/L (ref 38–126)
Anion gap: 10 (ref 5–15)
BUN: 15 mg/dL (ref 6–20)
CO2: 27 mmol/L (ref 22–32)
Calcium: 9.2 mg/dL (ref 8.9–10.3)
Chloride: 103 mmol/L (ref 98–111)
Creatinine, Ser: 0.88 mg/dL (ref 0.44–1.00)
GFR calc Af Amer: >60 mL/min
GFR calc non Af Amer: >60 mL/min
Glucose, Bld: 95 mg/dL (ref 70–99)
Potassium: 4.1 mmol/L (ref 3.5–5.1)
Sodium: 140 mmol/L (ref 135–145)
Total Bilirubin: 0.7 mg/dL (ref 0.3–1.2)
Total Protein: 7.4 g/dL (ref 6.5–8.1)

## 2020-05-04 LAB — SEDIMENTATION RATE: Sed Rate: 15 mm/hr (ref 0–30)

## 2020-05-04 LAB — VITAMIN D 25 HYDROXY (VIT D DEFICIENCY, FRACTURES): Vit D, 25-Hydroxy: 34.78 ng/mL (ref 30–100)

## 2020-05-04 LAB — VITAMIN B12: Vitamin B-12: 447 pg/mL (ref 180–914)

## 2020-05-04 LAB — MAGNESIUM: Magnesium: 1.9 mg/dL (ref 1.7–2.4)

## 2020-05-04 MED ORDER — KETOROLAC TROMETHAMINE 60 MG/2ML IM SOLN
60.0000 mg | Freq: Once | INTRAMUSCULAR | Status: AC
  Administered 2020-05-04: 60 mg via INTRAMUSCULAR

## 2020-05-04 MED ORDER — ORPHENADRINE CITRATE 30 MG/ML IJ SOLN
60.0000 mg | Freq: Once | INTRAMUSCULAR | Status: AC
  Administered 2020-05-04: 60 mg via INTRAMUSCULAR

## 2020-05-04 MED ORDER — PREDNISONE 20 MG PO TABS: ORAL_TABLET | ORAL | 0 refills | Status: AC

## 2020-05-04 MED ORDER — ORPHENADRINE CITRATE 30 MG/ML IJ SOLN
INTRAMUSCULAR | Status: AC
  Filled 2020-05-04: qty 2

## 2020-05-04 MED ORDER — KETOROLAC TROMETHAMINE 60 MG/2ML IM SOLN
INTRAMUSCULAR | Status: AC
  Filled 2020-05-04: qty 2

## 2020-05-04 NOTE — Progress Notes (Signed)
Patient: Gail [name]  Service Category: E/M  Provider: Gaspar Cola, MD  DOB: [birth year]  DOS: 05/04/2020  Referring Provider: Harvest Dark, FNP  MRN: [id number]  Setting: Ambulatory outpatient  PCP: Gladstone Lighter, MD  Type: New Patient  Specialty: Interventional Pain Management    Location: Office  Delivery: Face-to-face     Primary Reason(s) for Visit: Encounter for initial evaluation of one or more chronic problems (new to examiner) potentially causing chronic pain, and posing a threat to normal musculoskeletal function. (Level of risk: High) CC: Back Pain (low , at tailbone)  HPI  Gail [name] is a 49 y.o. year old, female patient, who comes today to see [place] for the first time for an initial evaluation of her chronic pain. She has Menorrhagia; Chronic pelvic pain in female; Dermoid cyst of right ovary; Dyspareunia; Post-operative state; Abdominal pain; Abnormal uterine bleeding; Neoplasm of ovary; Irregular menstrual cycle; Kidney stone on left side; Syncope; Pancreatitis; Nonspecific pain in the lumbar region; Acute lateral meniscus tear of right knee; Acute midline low back pain with bilateral sciatica; Acute pain of right knee; Pain in right knee; Chronic pain syndrome; Pharmacologic therapy; Disorder of skeletal system; Problems influencing health status; Chronic low back pain (1ry area of Pain) (Bilateral) (R>L) w/ sciatica (Right); Lumbosacral radiculopathy at L5 (Right); Cervicalgia; Neck and shoulder pain (Left); Cervical radiculitis (C6) (Left); Lumbar facet syndrome (Bilateral) (R>L); Chronic sacroiliac joint pain (Bilateral); 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 Today she comes in for evaluation of her Back Pain (low , at tailbone)  Pain  Assessment: Location: Lower Back Radiating:  Down into the right lower extremity Onset: More than a month ago Duration:  More than 3 months Quality: Constant, Burning, Aching Severity: 8 /10 (subjective, self-reported pain score)  Note: Reported level is compatible with observation.                         When using our objective Pain Scale, levels between 6 and 10/10 are said to belong in an emergency room, as it progressively worsens from a 6/10, described as severely limiting, requiring emergency care not usually available at an outpatient pain management facility. At a 6/10 level, communication becomes difficult and requires great effort. Assistance to reach the emergency department may be required. Facial flushing and profuse sweating along with potentially dangerous increases in heart rate and blood pressure will be evident. Effect on ADL:  Decreased Timing: Constant Modifying factors: positioning BP: (!) 161/80  HR: 62  Onset and Duration: Date of onset: 05/21/2019 Cause of pain: fall Severity: Getting worse Timing: Morning, Evening and During activity or exercise Aggravating Factors: Bending, Lifiting, Prolonged sitting, Prolonged standing, Walking downhill and Working Alleviating Factors: Bending, Lying down, Sitting, Sleeping and Using a brace Associated Problems: Day-time cramps, Night-time cramps, Inability to control bladder (urine), Nausea, Numbness, Tingling, Vomiting , Weakness and Pain that does not allow patient to sleep Quality of Pain: Burning, Constant, Sharp, Stabbing, Tingling and Uncomfortable Previous Examinations or Tests: MRI scan and X-rays Previous Treatments: Physical Therapy and Stretching exercises  The patient comes into the clinics today for the first time for a chronic pain management evaluation.  According to the referral, the patient was being sent for an evaluation of a possible spinal cord stimulator trial.  However, when we spoke to the patient about  this, they indicated not knowing anything about any type of implant trial and they were very concerned about [place] jumping into that right away.    According to the patient everything started with a work-related fall where she injured her back.  The primary area of pain is described to be that of the lower back, bilaterally with the right side being worse than the left.  The patient denies any surgeries, physical therapy, but does admit to having recently had an MRI and also having had some nerve blocks done by Dr. Sharlet Salina.  According to the available records, on on 07/21/2019 the patient underwent a bilateral L5-S1 transforaminal epidural steroid injection described to have given the patient moderate to good relief of the pain.  The same procedure was repeated on 11/10/2019 with moderate relief of the pain for 1 month and then again on 02/19/2020 with no relief.  On 03/14/2020 the patient had a left L5 trigger point injection, also with no relief of the pain.  The patient secondary area pain is that of the lower extremities, bilaterally, but with the primary pain being on the right side (R>L).  In the case of the right lower extremity the pain goes all the way to the top of her foot and the big toe following an L5 dermatomal distribution.  The patient indicates that the low back pain is significantly worse than the lower extremity pain.  The patient denies any neurosurgical evaluations.  The patient's third area pain is that of the neck on the left side with pain being referred towards her left shoulder, upper back, and going down the left upper extremity to her thumb and index finger following a C6 dermatomal distribution.  The patient indicates having numbness in her hand and weakness.  She also indicated that this pain is worse on the left side, but she occasionally will also experience pain and numbness in the same distribution on the right side.  She also states that this particular pain started  approximately 2 weeks ago.  The patient has been treated with Flexeril and meloxicam.  She denies having had any nerve conduction test and she also indicated that she is considering applying for disability.  Today I had an opportunity to review her lumbar MRI which is significant for an L4-5 disc degeneration with circumferential bulging and a posterior annular tear.  Provocative testing today was positive for pain going down to her lower back, primarily in the midline but also referring pain towards both sides on hyperextension.  Hyperextension and rotation on the other hand trigger pain ipsilaterally, on both sides.  The Patrick maneuver was positive bilaterally for pain being referred towards the SI joint but also for significant midline low back pain.  Lateral bending seem to trigger pain to the contralateral side of her back, but did not trigger any lower extremity pain indicating low possibility of foraminal involvement.  Today I took the time to provide the patient with information regarding my pain practice. The patient was informed that my practice is divided into two sections: an interventional pain management section, as well as a completely separate and distinct medication management section. I explained that I have procedure days for my interventional therapies, and evaluation days for follow-ups and medication management. Because of the amount of documentation required during both, they are kept separated. This means that there is the possibility that she may be scheduled for a procedure on one day,  and medication management the next. I have also informed her that because of staffing and facility limitations, I no longer take patients for medication management only. To illustrate the reasons for this, I gave the patient the example of surgeons, and how inappropriate it would be to refer a patient to his/her care, just to write for the post-surgical antibiotics on a surgery done by a different  surgeon.   Because interventional pain management is my board-certified specialty, the patient was informed that joining my practice means that they are open to any and all interventional therapies. I made it clear that this does not mean that they will be forced to have any procedures done. What this means is that I believe interventional therapies to be essential part of the diagnosis and proper management of chronic pain conditions. Therefore, patients not interested in these interventional alternatives will be better served under the care of a different practitioner.  The patient was also made aware of my Comprehensive Pain Management Safety Guidelines where by joining my practice, they limit all of their nerve blocks and joint injections to those done by our practice, for as long as we are retained to manage their care.   Historic Controlled Substance Pharmacotherapy Review  PMP and historical list of controlled substances: Zolpidem; tramadol; hydrocodone/APAP 5/325 Highest opioid analgesic regimen found: Tramadol 50 mg 1 tab PO 5x/day (250 mg/day of tramadol) (25 MME/day) Most recent opioid analgesic: None since 11/17/2019 Current opioid analgesics:  None Highest recorded MME/day: 25 mg/day MME/day: 0 mg/day  Medications: Patient brought medications to be checked, as requested Pharmacodynamics: Desired effects: Analgesia: The patient reports >50% benefit. Reported improvement in function: The patient reports medication allows her to accomplish basic ADLs. Clinically meaningful improvement in function (CMIF): Sustained CMIF goals met Perceived effectiveness: Described as relatively effective, allowing for increase in activities of daily living (ADL) Undesirable effects: Side-effects or Adverse reactions: None reported Historical Monitoring: The patient  reports no history of drug use. List of all UDS Test(s): No results found. List of other Serum/Urine Drug Screening Test(s):  No  results found. Historical Background Evaluation: [place] PMP: PDMP reviewed during this encounter. Six (6) year initial data search conducted.             PMP NARX Score Report:  Narcotic: 150 Sedative: 121 Stimulant: 000 [place] Department of public safety, offender search: Editor, commissioning Information) Non-contributory Risk Assessment Profile: Aberrant behavior: None observed or detected today Risk factors for fatal opioid overdose: None identified today PMP NARX Overdose Risk Score: 230 Fatal overdose hazard ratio (HR): Calculation deferred Non-fatal overdose hazard ratio (HR): Calculation deferred Risk of opioid abuse or dependence: 0.7-3.0% with doses ? 36 MME/day and 6.1-26% with doses ? 120 MME/day. Substance use disorder (SUD) risk level: See below Personal History of Substance Abuse (SUD-Substance use disorder):  Alcohol: Negative  Illegal Drugs: Negative  Rx Drugs: Negative  ORT Risk Level calculation: Low Risk Opioid Risk Tool - 05/04/20 0859      Family History of Substance Abuse   Alcohol  Negative    Illegal Drugs  Negative    Rx Drugs  Negative      Personal History of Substance Abuse   Alcohol  Negative    Illegal Drugs  Negative    Rx Drugs  Negative      Age   Age between 59-45 years   No      History of Preadolescent Sexual Abuse   History of Preadolescent Sexual Abuse  Negative or  Female      Psychological Disease   Psychological Disease  Negative    Depression  Negative      Total Score   Opioid Risk Tool Scoring  0    Opioid Risk Interpretation  Low Risk      ORT Scoring interpretation table:  Score <3 = Low Risk for SUD  Score between 4-7 = Moderate Risk for SUD  Score >8 = High Risk for Opioid Abuse   PHQ-2 Depression Scale:  Total score: 0  PHQ-2 Scoring interpretation table: (Score and probability of major depressive disorder)  Score 0 = No depression  Score 1 = 15.4% Probability  Score 2 = 21.1% Probability  Score 3 = 38.4% Probability  Score 4 =  45.5% Probability  Score 5 = 56.4% Probability  Score 6 = 78.6% Probability   PHQ-9 Depression Scale:  Total score: 0  PHQ-9 Scoring interpretation table:  Score 0-4 = No depression  Score 5-9 = Mild depression  Score 10-14 = Moderate depression  Score 15-19 = Moderately severe depression  Score 20-27 = Severe depression (2.4 times higher risk of SUD and 2.89 times higher risk of overuse)   Pharmacologic Plan: As per protocol, I have not taken over any controlled substance management, pending the results of ordered tests and/or consults.            Initial impression: Pending review of available data and ordered tests.  Meds   Current Outpatient Medications:  .  acetaminophen (TYLENOL) 500 MG tablet, Take 2 tablets (1,000 mg total) by mouth every 8 (eight) hours., Disp: 90 tablet, Rfl: 2 .  Ascorbic Acid (VITAMIN C) 1000 MG tablet, Take 1,000 mg by mouth daily., Disp: , Rfl:  .  cyclobenzaprine (FLEXERIL) 10 MG tablet, Take 1 tablet (10 mg total) by mouth 3 (three) times daily as needed., Disp: 30 tablet, Rfl: 0 .  DULoxetine (CYMBALTA) 20 MG capsule, Take 20 mg by mouth daily., Disp: , Rfl:  .  loratadine (CLARITIN) 10 MG tablet, Take 1 tablet (10 mg total) by mouth daily., Disp: 30 tablet, Rfl: 0 .  meloxicam (MOBIC) 15 MG tablet, Take 15 mg by mouth daily., Disp: , Rfl:  .  metaxalone (SKELAXIN) 800 MG tablet, Take 800 mg by mouth in the morning and at bedtime., Disp: , Rfl:  .  Multiple Vitamin (MULTIVITAMIN WITH MINERALS) TABS tablet, Take 1 tablet by mouth daily., Disp: , Rfl:  .  omeprazole (PRILOSEC) 20 MG capsule, Take 20 mg by mouth daily., Disp: , Rfl:  .  ondansetron (ZOFRAN ODT) 4 MG disintegrating tablet, Take 1 tablet (4 mg total) by mouth every 8 (eight) hours as needed for nausea or vomiting., Disp: 20 tablet, Rfl: 0 .  zolpidem (AMBIEN) 5 MG tablet, Take 5 mg by mouth at bedtime as needed for sleep., Disp: , Rfl:  .  predniSONE (DELTASONE) 20 MG tablet, Take 3 tablets  (60 mg total) by mouth daily with breakfast for 3 days, THEN 2 tablets (40 mg total) daily with breakfast for 3 days, THEN 1 tablet (20 mg total) daily with breakfast for 3 days., Disp: 18 tablet, Rfl: 0  Imaging Review  Lumbosacral Imaging: Lumbar MR wo contrast:  Results for orders placed during the hospital encounter of 04/21/20  MR LUMBAR SPINE WO CONTRAST   Narrative CLINICAL DATA:  49 year old female with low back pain since a fall last year. Prior spinal injections.  EXAM: MRI LUMBAR SPINE WITHOUT CONTRAST  TECHNIQUE: Multiplanar, multisequence MR  imaging of the lumbar spine was performed. No intravenous contrast was administered.  COMPARISON:  Lumbar MRI 06/30/2019.  Lumbar radiographs 04/06/2019.  FINDINGS: Segmentation: Otherwise hypoplastic ribs assumed normal lumbar segmentation at T12 with, and this is the same numbering system used on the prior MRI.  Alignment:  Stable lumbar lordosis.  Vertebrae: No marrow edema or evidence of acute osseous abnormality. Visualized bone marrow signal is within normal limits. Intact visible sacrum and SI joints.  Conus medullaris and cauda equina: Conus extends to the T12-L1 level. No lower spinal cord or conus signal abnormality.  Paraspinal and other soft tissues: Negative.  Disc levels:  T11-T12 through L3-L4 remain negative.  L4-L5: Chronic disc desiccation and circumferential disc bulge. Chronic posterior annular fissure of the disc, although less apparent on axial images today. And the posterior component of disc appears less eccentric to the left. Borderline to mild facet hypertrophy. Left lateral recess patency appears improved since last year with no spinal or convincing lateral recess stenosis now. No foraminal stenosis.  L5-S1:  Negative.  IMPRESSION: Isolated lumbar disc degeneration at L4-L5 appears mildly improved from the MRI last year. No convincing neural impingement at this time.   Electronically  Signed   By: Genevie Ann M.D.   On: 04/21/2020 22:57    Lumbar DG 2-3 views:  Results for orders placed during the hospital encounter of 04/06/19  DG Lumbar Spine 2-3 Views   Narrative CLINICAL DATA:  49 year old female with lower back pain  EXAM: LUMBAR SPINE - 2-3 VIEW  COMPARISON:  CT 07/29/2015  FINDINGS: Lumbar Spine:  Lumbar vertebral elements maintain normal alignment without evidence of anterolisthesis, retrolisthesis, subluxation.  No acute fracture line identified.  Vertebral body heights maintained.  Disc space heights maintained with no significant degenerative disc disease or endplate changes.  No significant facet changes.  Unremarkable appearance of the visualized abdomen.  IMPRESSION: Negative for acute fracture or malalignment of the lumbar spine   Electronically Signed   By: Corrie Mckusick D.O.   On: 04/06/2019 12:01    Wrist Imaging: Wrist-R DG Complete:  Results for orders placed during the hospital encounter of 10/12/15  DG Wrist Complete Right   Narrative CLINICAL DATA:  Injured right wrist at work 2 weeks ago. Complaining of pain and swelling.  EXAM: RIGHT WRIST - COMPLETE 3+ VIEW  COMPARISON:  None.  FINDINGS: There is no evidence of fracture or dislocation. There is no evidence of arthropathy or other focal bone abnormality. Soft tissues are unremarkable.  IMPRESSION: Negative.   Electronically Signed   By: Lajean Manes M.D.   On: 10/12/2015 16:02    Complexity Note: Imaging results reviewed. Results shared with Ms. Whitfill, using Layman's terms.                        ROS  Cardiovascular: Chest pain Pulmonary or Respiratory: No reported pulmonary signs or symptoms such as wheezing and difficulty taking a deep full breath (Asthma), difficulty blowing air out (Emphysema), coughing up mucus (Bronchitis), persistent dry cough, or temporary stoppage of breathing during sleep Neurological: No reported neurological signs or  symptoms such as seizures, abnormal skin sensations, urinary and/or fecal incontinence, being born with an abnormal open spine and/or a tethered spinal cord Psychological-Psychiatric: No reported psychological or psychiatric signs or symptoms such as difficulty sleeping, anxiety, depression, delusions or hallucinations (schizophrenial), mood swings (bipolar disorders) or suicidal ideations or attempts Gastrointestinal: No reported gastrointestinal signs or symptoms such as vomiting or  evacuating blood, reflux, heartburn, alternating episodes of diarrhea and constipation, inflamed or scarred liver, or pancreas or irrregular and/or infrequent bowel movements Genitourinary: No reported renal or genitourinary signs or symptoms such as difficulty voiding or producing urine, peeing blood, non-functioning kidney, kidney stones, difficulty emptying the bladder, difficulty controlling the flow of urine, or chronic kidney disease Hematological: No reported hematological signs or symptoms such as prolonged bleeding, low or poor functioning platelets, bruising or bleeding easily, hereditary bleeding problems, low energy levels due to low hemoglobin or being anemic Endocrine: No reported endocrine signs or symptoms such as high or low blood sugar, rapid heart rate due to high thyroid levels, obesity or weight gain due to slow thyroid or thyroid disease Rheumatologic: No reported rheumatological signs and symptoms such as fatigue, joint pain, tenderness, swelling, redness, heat, stiffness, decreased range of motion, with or without associated rash Musculoskeletal: Negative for myasthenia gravis, muscular dystrophy, multiple sclerosis or malignant hyperthermia Work History: Out on work excuse, Out of work due to pain and Quit going to work on his/her own 05/21/2019  Allergies  Ms. Devol is allergic to ciprodex [ciprofloxacin-dexamethasone]; hydrocodone; oxycodone; and sulfamethoxazole-trimethoprim.  Laboratory  Chemistry Profile   Renal Lab Results  Component Value Date   BUN 15 05/04/2020   CREATININE 0.88 05/04/2020   GFRAA >60 05/04/2020   GFRNONAA >60 05/04/2020   SPECGRAV >1.030 (H) 01/16/2016   PHUR 5.5 01/16/2016   PROTEINUR NEGATIVE 03/30/2020     Electrolytes Lab Results  Component Value Date   NA 140 05/04/2020   K 4.1 05/04/2020   CL 103 05/04/2020   CALCIUM 9.2 05/04/2020   MG 1.9 05/04/2020     Hepatic Lab Results  Component Value Date   AST 23 05/04/2020   ALT 20 05/04/2020   ALBUMIN 4.3 05/04/2020   ALKPHOS 72 05/04/2020   LIPASE 102 (H) 08/06/2019     ID Lab Results  Component Value Date   SARSCOV2NAA NEGATIVE 09/17/2019   PREGTESTUR NEGATIVE 04/15/2015     Bone Lab Results  Component Value Date   VD25OH 34.78 05/04/2020     Endocrine Lab Results  Component Value Date   GLUCOSE 95 05/04/2020   GLUCOSEU NEGATIVE 03/30/2020   HGBA1C 5.9 (H) 08/06/2019   TSH 0.801 08/06/2019     Neuropathy Lab Results  Component Value Date   VITAMINB12 447 05/04/2020   HGBA1C 5.9 (H) 08/06/2019     CNS No results found for: COLORCSF, APPEARCSF, RBCCOUNTCSF, WBCCSF, POLYSCSF, LYMPHSCSF, EOSCSF, PROTEINCSF, GLUCCSF, JCVIRUS, CSFOLI, IGGCSF, LABACHR, ACETBL, LABACHR, ACETBL   Inflammation (CRP: Acute  ESR: Chronic) Lab Results  Component Value Date   ESRSEDRATE 15 05/04/2020     Rheumatology No results found for: RF, ANA, LABURIC, URICUR, LYMEIGGIGMAB, LYMEABIGMQN, HLAB27   Coagulation Lab Results  Component Value Date   PLT 265 03/29/2020     Cardiovascular Lab Results  Component Value Date   TROPONINI <0.03 03/03/2019   HGB 13.6 03/29/2020   HCT 40.5 03/29/2020     Screening Lab Results  Component Value Date   SARSCOV2NAA NEGATIVE 09/17/2019   PREGTESTUR NEGATIVE 04/15/2015     Cancer No results found for: CEA, CA125, LABCA2   Allergens No results found for: ALMOND, APPLE, ASPARAGUS, AVOCADO, BANANA, BARLEY, BASIL, BAYLEAF, GREENBEAN,  LIMABEAN, WHITEBEAN, BEEFIGE, REDBEET, BLUEBERRY, BROCCOLI, CABBAGE, MELON, CARROT, CASEIN, CASHEWNUT, CAULIFLOWER, CELERY     Note: Lab results reviewed.  PFSH  Drug: Ms. Canny  reports no history of drug use. Alcohol:  reports no history  of alcohol use. Tobacco:  reports that she has never smoked. She has never used smokeless tobacco. Medical:  has a past medical history of Abdominal pain (10/08/2013), Abnormal uterine bleeding (03/24/2015), Anemia, Calculus of kidney (01/25/2015), Chronic pelvic pain in female (04/06/2015), Dermoid cyst of ovary (04/06/2015), Episode of syncope (10/08/2013), and Knee pain. Family: family history is not on file.  Past Surgical History:  Procedure Laterality Date  . ABDOMINAL HYSTERECTOMY    . CESAREAN SECTION    . CHOLECYSTECTOMY N/A 08/07/2019   Procedure: LAPAROSCOPIC CHOLECYSTECTOMY;  Surgeon: Herbert Pun, MD;  Location: [facility];  Service: General;  Laterality: N/A;  . CYSTOSCOPY N/A 04/15/2015   Procedure: Consuela Mimes;  Surgeon: Boykin Nearing, MD;  Location: [facility];  Service: Gynecology;  Laterality: N/A;  . CYSTOSCOPY WITH RETROGRADE PYELOGRAM, URETEROSCOPY AND STENT PLACEMENT Left 12/28/2015   Procedure: CYSTOSCOPY WITH RETROGRADE PYELOGRAM, URETEROSCOPY AND STENT PLACEMENT;  Surgeon: Festus Aloe, MD;  Location: [facility];  Service: Urology;  Laterality: Left;  . CYSTOSCOPY WITH STENT PLACEMENT Left 01/18/2016   Procedure: CYSTOSCOPY WITH STENT REMOVAL;  Surgeon: Nickie Retort, MD;  Location: [facility];  Service: Urology;  Laterality: Left;  . HAND TENODESIS    . KNEE ARTHROSCOPY WITH LATERAL RELEASE Right 09/21/2019   Procedure: KNEE ARTHROSCOPY WITH LATERAL RELEASE, MEDIAL MENISECTOMY, HOFFA'S PAD DEBRIDEMENT;  Surgeon: Leim Fabry, MD;  Location: [facility];  Service: Orthopedics;  Laterality: Right;  . LAPAROSCOPIC ASSISTED VAGINAL HYSTERECTOMY N/A 04/15/2015   Procedure: LAPAROSCOPIC ASSISTED VAGINAL HYSTERECTOMY;  Surgeon:  Boykin Nearing, MD;  Location: [facility];  Service: Gynecology;  Laterality: N/A;  . LAPAROSCOPIC SALPINGO OOPHERECTOMY Bilateral 04/15/2015   Procedure: LAPAROSCOPIC SALPINGO OOPHORECTOMY;  Surgeon: Boykin Nearing, MD;  Location: [facility];  Service: Gynecology;  Laterality: Bilateral;  . URETEROSCOPY WITH HOLMIUM LASER LITHOTRIPSY Left 01/18/2016   Procedure: URETEROSCOPY WITH STONE EXTRACTION;  Surgeon: Nickie Retort, MD;  Location: [facility];  Service: Urology;  Laterality: Left;   Active Ambulatory Problems    Diagnosis Date Noted  . Menorrhagia 04/06/2015  . Chronic pelvic pain in female 04/06/2015  . Dermoid cyst of right ovary 01/23/2015  . Dyspareunia 04/06/2015  . Post-operative state 04/15/2015  . Abdominal pain 10/08/2013  . Abnormal uterine bleeding 10/08/2013  . 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 Irregular menstrual cycle 02/25/2013  . Kidney stone on left side 01/25/2015  . Syncope 10/08/2013  . Pancreatitis 08/06/2019  . Nonspecific pain in the lumbar region 06/16/2019  . Acute lateral meniscus tear of right knee 01/14/2020  . Acute midline low back pain with bilateral sciatica 01/14/2020  . Acute pain of right knee 01/14/2020  . Pain in right knee 06/16/2019  . Chronic pain syndrome 05/04/2020  . Pharmacologic therapy 05/04/2020  . Disorder of skeletal system 05/04/2020  . Problems influencing health status 05/04/2020  . Chronic low back pain (1ry area of Pain) (Bilateral) (R>L) w/ sciatica (Right) 05/04/2020  . Lumbosacral radiculopathy at L5 (Right) 05/04/2020  . Cervicalgia 05/04/2020  . Neck and shoulder pain (Left) 05/04/2020  . Cervical radiculitis (C6) (Left) 05/04/2020  . Lumbar facet syndrome (Bilateral) (R>L) 05/04/2020  . Chronic sacroiliac joint pain (Bilateral) 05/04/2020  . Discogenic low back pain (L4-5) 05/04/2020  . 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 Chronic lower extremity pain (2ry area of Pain)  (Bilateral) (R>L) 05/04/2020  . Pain and numbness of upper extremity (Left) 05/04/2020  . Abnormal MRI, lumbar spine (04/21/2020) 05/04/2020   Resolved Ambulatory Problems  Diagnosis Date Noted  . No Resolved Ambulatory Problems   Past Medical History:  Diagnosis Date  . Anemia   . Calculus of kidney 01/25/2015  . 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 Episode of syncope 10/08/2013  . Knee pain    Constitutional Exam  General appearance: Well nourished, well developed, and well hydrated. In no apparent acute distress Vitals:   05/04/20 0845  BP: (!) 161/80  Pulse: 62  Resp: 16  Temp: 97.7 F (36.5 C)  TempSrc: Temporal  SpO2: 100%  Weight: 120 lb (54.4 kg)  Height: _0  (1.575 m)   BMI Assessment: Estimated body mass index is 21.95 kg/m as calculated from the following:   Height as of this encounter: _1  (1.575 m).   Weight as of this encounter: 120 lb (54.4 kg).  BMI interpretation table: BMI level Category Range association with higher incidence of chronic pain  <18 kg/m2 Underweight   18.5-24.9 kg/m2 Ideal body weight   25-29.9 kg/m2 Overweight Increased incidence by 20%  30-34.9 kg/m2 Obese (Class I) Increased incidence by 68%  35-39.9 kg/m2 Severe obesity (Class II) Increased incidence by 136%  >40 kg/m2 Extreme obesity (Class III) Increased incidence by 254%   Patient's current BMI Ideal Body weight  Body mass index is 21.95 kg/m. Ideal body weight: 50.1 kg (110 lb 7.2 oz) Adjusted ideal body weight: 51.8 kg (114 lb 4.3 oz)   BMI Readings from Last 4 Encounters:  05/04/20 21.95 kg/m  03/29/20 21.95 kg/m  09/21/19 20.56 kg/m  09/17/19 20.85 kg/m   Wt Readings from Last 4 Encounters:  05/04/20 120 lb (54.4 kg)  03/29/20 120 lb (54.4 kg)  09/21/19 112 lb 7 oz (51 kg)  09/17/19 114 lb (51.7 kg)    Psych/Mental status: Alert, oriented x 3 (person, place, & time)       Eyes: PERLA Respiratory: No evidence of acute respiratory distress  Cervical  Spine Exam  Skin & Axial Inspection: No masses, redness, edema, swelling, or associated skin lesions Alignment: Symmetrical Functional ROM: Decreased ROM, bilaterally Stability: No instability detected Muscle Tone/Strength: Guarding detected Sensory (Neurological): Movement-associated discomfort Palpation: Tender              Upper Extremity (UE) Exam    Side: Right upper extremity  Side: Left upper extremity  Skin & Extremity Inspection: Skin color, temperature, and hair growth are WNL. No peripheral edema or cyanosis. No masses, redness, swelling, asymmetry, or associated skin lesions. No contractures.  Skin & Extremity Inspection: Skin color, temperature, and hair growth are WNL. No peripheral edema or cyanosis. No masses, redness, swelling, asymmetry, or associated skin lesions. No contractures.  Functional ROM: Unrestricted ROM          Functional ROM: Unrestricted ROM          Muscle Tone/Strength: Functionally intact. No obvious neuro-muscular anomalies detected.  Muscle Tone/Strength: Functionally intact. No obvious neuro-muscular anomalies detected.  Sensory (Neurological): Unimpaired          Sensory (Neurological): Unimpaired          Palpation: No palpable anomalies              Palpation: No palpable anomalies              Provocative Test(s):  Phalen's test: deferred Tinel's test: deferred Apley's scratch test (touch opposite shoulder):  Action 1 (Across chest): deferred Action 2 (Overhead): deferred Action 3 (LB reach): deferred   Provocative Test(s):  Phalen's test: deferred Tinel's test:  deferred Apley's scratch test (touch opposite shoulder):  Action 1 (Across chest): deferred Action 2 (Overhead): deferred Action 3 (LB reach): deferred    Thoracic Spine Area Exam  Skin & Axial Inspection: No masses, redness, or swelling Alignment: Symmetrical Functional ROM: Unrestricted ROM Stability: No instability detected Muscle Tone/Strength: Functionally intact. No obvious  neuro-muscular anomalies detected. Sensory (Neurological): Unimpaired Muscle strength & Tone: No palpable anomalies  Lumbar Exam  Skin & Axial Inspection: No masses, redness, or swelling Alignment: Symmetrical Functional ROM: Decreased ROM affecting both sides Stability: No instability detected Muscle Tone/Strength: Increased muscle tone over affected area Sensory (Neurological): Movement-associated pain Palpation: Complains of area being tender to palpation       Provocative Tests: Hyperextension/rotation test: (+) bilaterally for facet joint pain. Lumbar quadrant test (Kemp's test): (-) bilateral for foraminal stenosis Lateral bending test: (-) Negative for reproduction of the lower extremity pain Patrick's Maneuver: (+) for bilateral S-I arthralgia and midline pain likely to be discogenic in nature FABER* test: Significant midline pain as well as bilateral pain in the area of the SI joints.  No pain in the hip areas.                   S-I anterior distraction/compression test: deferred today         S-I lateral compression test: deferred today         S-I Thigh-thrust test: deferred today         S-I Gaenslen's test: deferred today         *(Flexion, ABduction and External Rotation)  Gait & Posture Assessment  Ambulation: Limited Gait: Antalgic Posture: Difficulty standing up straight, due to pain   Lower Extremity Exam    Side: Right lower extremity  Side: Left lower extremity  Stability: No instability observed          Stability: No instability observed          Skin & Extremity Inspection: Skin color, temperature, and hair growth are WNL. No peripheral edema or cyanosis. No masses, redness, swelling, asymmetry, or associated skin lesions. No contractures.  Skin & Extremity Inspection: Skin color, temperature, and hair growth are WNL. No peripheral edema or cyanosis. No masses, redness, swelling, asymmetry, or associated skin lesions. No contractures.  Functional ROM: Guarding          Limited SLR (straight leg raise) with midline lower back pain  Functional ROM: Guarding         Limited SLR (straight leg raise) with pain being triggered in the midline of the lower back.  Muscle Tone/Strength: Able to Toe-walk & Heel-walk without problems, however the patient indicates perceiving weakness in her lower extremity with the right being worse than the left  Muscle Tone/Strength: Able to Toe-walk & Heel-walk without problems  Sensory (Neurological): Dermatomal pain pattern top of foot & big toe (L5)  Sensory (Neurological): Dermatomal pain pattern top of foot (L5)  DTR: Patellar: deferred today Achilles: deferred today Plantar: deferred today  DTR: Patellar: deferred today Achilles: deferred today Plantar: deferred today  Palpation: No palpable anomalies  Palpation: No palpable anomalies   Assessment  Primary Diagnosis & Pertinent Problem List: The primary encounter diagnosis was Chronic pain syndrome. Diagnoses of Chronic low back pain (1ry area of Pain) (Bilateral) (R>L) w/ sciatica (Right), Discogenic low back pain (L4-5), Inflammatory spondylopathy of lumbosacral region [facility]), Lumbar facet syndrome (Bilateral) (R>L), Chronic lower extremity pain (2ry area of Pain) (Bilateral) (R>L), Lumbosacral radiculopathy at L5 (Right), Cervicalgia, Neck and  shoulder pain (Left), Cervical radiculitis (C6) (Left), Pain and numbness of upper extremity (Left), Chronic sacroiliac joint pain (Bilateral), Abnormal MRI, lumbar spine (04/21/2020), Pharmacologic therapy, Disorder of skeletal system, and Problems influencing health status were also pertinent to this visit.  Visit Diagnosis (New problems to examiner): 1. Chronic pain syndrome   2. Chronic low back pain (1ry area of Pain) (Bilateral) (R>L) w/ sciatica (Right)   3. Discogenic low back pain (L4-5)   4. Inflammatory spondylopathy of lumbosacral region ([place])   5. Lumbar facet syndrome (Bilateral) (R>L)   6. Chronic lower extremity  pain (2ry area of Pain) (Bilateral) (R>L)   7. Lumbosacral radiculopathy at L5 (Right)   8. Cervicalgia   9. Neck and shoulder pain (Left)   10. Cervical radiculitis (C6) (Left)   11. Pain and numbness of upper extremity (Left)   12. Chronic sacroiliac joint pain (Bilateral)   13. Abnormal MRI, lumbar spine (04/21/2020)   14. Pharmacologic therapy   15. Disorder of skeletal system   16. Problems influencing health status    Plan of Care (Initial workup plan)  Note: Ms. Edling was reminded that as per protocol, today's visit has been an evaluation only. We have not taken over the patient's controlled substance management.  Problem-specific plan: No problem-specific Assessment & Plan notes found for this encounter.   Lab Orders     Compliance Drug Analysis, Ur     Comp. Metabolic Panel (12)     Magnesium     Vitamin B12     Sedimentation rate     25-Hydroxy vitamin D Lcms D2+D3     C-reactive protein  Imaging Orders     DG Cervical Spine With Flex & Extend     DG Lumbar Spine Complete W/Bend Referral Orders  No referral(s) requested today   Procedure Orders    No procedure(s) ordered today   Pharmacotherapy (current): Medications ordered:  Meds ordered this encounter  Medications  . predniSONE (DELTASONE) 20 MG tablet    Sig: Take 3 tablets (60 mg total) by mouth daily with breakfast for 3 days, THEN 2 tablets (40 mg total) daily with breakfast for 3 days, THEN 1 tablet (20 mg total) daily with breakfast for 3 days.    Dispense:  18 tablet    Refill:  0  . ketorolac (TORADOL) injection 60 mg  . orphenadrine (NORFLEX) injection 60 mg   Medications administered during this visit: We administered ketorolac and orphenadrine.   Pharmacological management options:  Opioid Analgesics: The patient was informed that there is no guarantee that she would be a candidate for opioid analgesics. The decision will be made following CDC guidelines. This decision will be based on the  results of diagnostic studies, as well as Ms. Bogdon's risk profile.   Membrane stabilizer: To be determined at a later time  Muscle relaxant: To be determined at a later time  NSAID: To be determined at a later time  Other analgesic(s): To be determined at a later time   Interventional management options: Ms. Pflieger was informed that there is no guarantee that she would be a candidate for interventional therapies. The decision will be based on the results of diagnostic studies, as well as Ms. Harvel's risk profile.  Procedure(s) under consideration:  Today we have provided the patient with a steroid taper pack. In addition, we did provide the patient with an IM injection of Toradol 60 mg and Norflex 60 mg to help break the pain cycle since she was  very uncomfortable after the physical exam. Diagnostic right-sided L4-5 LESI  Diagnostic L4-5 provocative discogram  Possible L4-5 IDET (intradiscal electrothermal coagulation)  Possible radiofrequency denervation of the L4-5 intervertebral disc    Provider-requested follow-up: Return for (F2F), (2V), (s/p Tests).  Future Appointments  Date Time Provider [place]  06/07/2020  2:15 PM Milinda Pointer, MD [facility] None    Note by: Gaspar Cola, MD Date: 05/04/2020; Time: 8:08 PM

## 2020-05-04 NOTE — Progress Notes (Signed)
Safety precautions to be maintained throughout the outpatient stay will include: orient to surroundings, keep bed in low position, maintain call bell within reach at all times, provide assistance with transfer out of bed and ambulation.  

## 2020-05-10 LAB — COMPLIANCE DRUG ANALYSIS, UR

## 2020-06-03 DIAGNOSIS — R202 Paresthesia of skin: Secondary | ICD-10-CM | POA: Insufficient documentation

## 2020-06-03 DIAGNOSIS — R200 Anesthesia of skin: Secondary | ICD-10-CM | POA: Insufficient documentation

## 2020-06-07 ENCOUNTER — Ambulatory Visit: Payer: Federal, State, Local not specified - PPO | Admitting: Pain Medicine

## 2020-06-12 NOTE — Progress Notes (Signed)
PROVIDER NOTE: Information contained herein reflects review and annotations entered in association with encounter. Interpretation of such information and data should be left to medically-trained personnel. Information provided to patient can be located elsewhere in the medical record under "Patient Instructions". Document created using STT-dictation technology, any transcriptional errors that may result from process are unintentional.    Patient: Gail [name]  Service Category: E/M  Provider: Gaspar Cola, MD  DOB: [birth year]  DOS: 06/13/2020  Specialty: Interventional Pain Management  MRN: [id number]  Setting: Ambulatory outpatient  PCP: Gladstone Lighter, MD  Type: Established Patient    Referring Provider: Gladstone Lighter, MD  Location: Office  Delivery: Face-to-face     Primary Reason(s) for Visit: Encounter for evaluation before starting new chronic pain management plan of care (Level of risk: moderate) CC: Back Pain (lower)  HPI  Gail [name] is a 49 y.o. year old, female patient, who comes today for a follow-up evaluation to review the test results and decide on a treatment plan. She has Menorrhagia; Chronic pelvic pain in female; Dermoid cyst of right ovary; Dyspareunia; Post-operative state; Abdominal pain; Abnormal uterine bleeding; Neoplasm of ovary; Irregular menstrual cycle; Kidney stone on left side; Syncope; Pancreatitis; Nonspecific pain in the lumbar region; Acute lateral meniscus tear of right knee; Acute midline low back pain with bilateral sciatica; Acute pain of right knee; Pain in right knee; Chronic pain syndrome; Pharmacologic therapy; Disorder of skeletal system; Problems influencing health status; Chronic low back pain (1ry area of Pain) (Bilateral) (R>L) w/ sciatica (Right); Lumbosacral radiculopathy at L5 (Right); Cervicalgia; Neck and shoulder pain (Left); Cervical radiculitis (C6) (Left); Lumbar facet syndrome (Bilateral) (R>L); Chronic sacroiliac joint pain  (Bilateral); Discogenic low back pain (L4-5); Inflammatory spondylopathy of lumbosacral region [facility]); Chronic lower extremity pain (2ry area of Pain) (Bilateral) (R>L); Pain and numbness of upper extremity (Left); Abnormal MRI, lumbar spine (04/21/2020); Back pain with sciatica; and Numbness and tingling on their problem list. Her primarily concern today is the Back Pain (lower)  Pain Assessment: Location: Lower, Right Back Radiating: buttocks Onset: More than a month ago Duration: Chronic pain Quality: Aching, Sharp, Shooting Severity: 5 /10 (subjective, self-reported pain score)  Note: Reported level is compatible with observation.                         When using our objective Pain Scale, levels between 6 and 10/10 are said to belong in an emergency room, as it progressively worsens from a 6/10, described as severely limiting, requiring emergency care not usually available at an outpatient pain management facility. At a 6/10 level, communication becomes difficult and requires great effort. Assistance to reach the emergency department may be required. Facial flushing and profuse sweating along with potentially dangerous increases in heart rate and blood pressure will be evident. Effect on ADL: unable to stand prolonged Timing: Constant Modifying factors: medication, heat BP: (!) 155/67  HR: 74  Gail [name] comes in today for a follow-up visit after her initial evaluation on 05/04/2020. Today we went over the results of her tests. These were explained in "Layman's terms". During today's appointment we went over my diagnostic impression, as well as the proposed treatment plan.  According to the referral, the patient was being sent for an evaluation of a possible spinal cord stimulator trial.  However, when we spoke to the patient about this, they indicated not knowing anything about any type of implant trial and they were very concerned about [place] jumping  into that right away.    According to the  patient everything started with a work-related fall where she injured her back.  The primary area of pain is described to be that of the lower back, bilaterally with the right side being worse than the left.  The patient denies any surgeries, physical therapy, but does admit to having recently had an MRI and also having had some nerve blocks done by Dr. Sharlet Salina.  According to the available records, on on 07/21/2019 the patient underwent a bilateral L5-S1 transforaminal epidural steroid injection described to have given the patient moderate to good relief of the pain.  The same procedure was repeated on 11/10/2019 with moderate relief of the pain for 1 month and then again on 02/19/2020 with no relief.  On 03/14/2020 the patient had a left L5 trigger point injection, also with no relief of the pain.  The patient secondary area pain is that of the lower extremities, bilaterally, but with the primary pain being on the right side (R>L).  In the case of the right lower extremity the pain goes all the way to the top of her foot and the big toe following an L5 dermatomal distribution.  The patient indicates that the low back pain is significantly worse than the lower extremity pain.  The patient denies any neurosurgical evaluations.  The patient's third area pain is that of the neck on the left side with pain being referred towards her left shoulder, upper back, and going down the left upper extremity to her thumb and index finger following a C6 dermatomal distribution.  The patient indicates having numbness in her hand and weakness.  She also indicated that this pain is worse on the left side, but she occasionally will also experience pain and numbness in the same distribution on the right side.  She also states that this particular pain started approximately 2 weeks ago.  The patient has been treated with Flexeril and meloxicam.  She denies having had any nerve conduction test and she also indicated that  she is considering applying for disability.  Today we reviewed the results of her lab work and x-rays as well as her nerve conduction test which indicates that she has evidence of a chronic lower lumbosacral radiculopathy.  She is still having difficulty walking on her right leg.  At this point, I will go ahead and bring her in for an interlaminar LESI at the L4-5 level.  She has already had some transforaminal epidural steroid injections with very limited results.  She had a series of 3 done by Dr. Sharlet Salina.  Today we also talked about some other possible alternatives including a provocative discogram, IDET, and some possible ways to treat this.  Controlled Substance Pharmacotherapy Assessment REMS (Risk Evaluation and Mitigation Strategy)  Analgesic: None Highest recorded MME/day: 25 mg/day MME/day: 0 mg/day  Pill Count: None expected due to no prior prescriptions written by our practice. No notes on file Pharmacokinetics: Liberation and absorption (onset of action): WNL Distribution (time to peak effect): WNL Metabolism and excretion (duration of action): WNL         Pharmacodynamics: Desired effects: Analgesia: Ms. Delbridge reports >50% benefit. Functional ability: Patient reports that medication allows her to accomplish basic ADLs Clinically meaningful improvement in function (CMIF): Sustained CMIF goals met Perceived effectiveness: Described as relatively effective, allowing for increase in activities of daily living (ADL) Undesirable effects: Side-effects or Adverse reactions: None reported Monitoring: [place] PMP: PDMP reviewed during this encounter. Online  review of the past 93-monthperiod previously conducted. Not applicable at this point since we have not taken over the patient's medication management yet. List of other Serum/Urine Drug Screening Test(s):  No results found. List of all UDS test(s) done:  Lab Results  Component Value Date   SUMMARY Note 05/04/2020   Last UDS on  record: Summary  Date Value Ref Range Status  05/04/2020 Note  Final    Comment:    ==================================================================== Compliance Drug Analysis, Ur ==================================================================== Test                             Result       Flag       Units Drug Present not Declared for Prescription Verification   Zolpidem Acid                  PRESENT      UNEXPECTED    Zolpidem acid is an expected metabolite of zolpidem. Drug Absent but Declared for Prescription Verification   Cyclobenzaprine                Not Detected UNEXPECTED   Metaxalone                     Not Detected UNEXPECTED   Duloxetine                     Not Detected UNEXPECTED   Acetaminophen                  Not Detected UNEXPECTED    Acetaminophen, as indicated in the declared medication list, is not    always detected even when used as directed. ==================================================================== Test                      Result    Flag   Units      Ref Range   Creatinine              179              mg/dL      >=20 ==================================================================== Declared Medications:  The flagging and interpretation on this report are based on the  following declared medications.  Unexpected results may arise from  inaccuracies in the declared medications.  **Note: The testing scope of this panel includes these medications:  Cyclobenzaprine (Flexeril)  Duloxetine (Cymbalta)  Metaxalone (Skelaxin)  **Note: The testing scope of this panel does not include small to  moderate amounts of these reported medications:  Acetaminophen (Tylenol)  **Note: The testing scope of this panel does not include the  following reported medications:  Loratadine (Claritin)  Meloxicam (Mobic)  Multivitamin  Omeprazole (Prilosec)  Ondansetron (Zofran)  Prednisone (Deltasone)  Vitamin  C ==================================================================== For clinical consultation, please call ([phone] ====================================================================    UDS interpretation: No unexpected findings.          Medication Assessment Form: Patient introduced to form today Treatment compliance: Treatment may start today if patient agrees with proposed plan. Evaluation of compliance is not applicable at this point Risk Assessment Profile: Aberrant behavior: See initial evaluations. None observed or detected today Comorbid factors increasing risk of overdose: See initial evaluation. No additional risks detected today Opioid risk tool (ORT):  Opioid Risk  05/04/2020  Alcohol 0  Illegal Drugs 0  Rx Drugs 0  Alcohol 0  Illegal Drugs 0  Rx Drugs 0  Age between 55-45 years  0  History of Preadolescent Sexual Abuse 0  Psychological Disease 0  Depression 0  Opioid Risk Tool Scoring 0  Opioid Risk Interpretation Low Risk    ORT Scoring interpretation table:  Score <3 = Low Risk for SUD  Score between 4-7 = Moderate Risk for SUD  Score >8 = High Risk for Opioid Abuse   Risk of substance use disorder (SUD): Low  Risk Mitigation Strategies:  Patient opioid safety counseling: Completed today. Counseling provided to patient as per "Patient Counseling Document". Document signed by patient, attesting to counseling and understanding Patient-Prescriber Agreement (PPA): Obtained today.  Controlled substance notification to other providers: Written and sent today.  Pharmacologic Plan: Today we may be taking over the patient's pharmacological regimen. See below.             Laboratory Chemistry Profile   Renal Lab Results  Component Value Date   BUN 15 05/04/2020   CREATININE 0.88 05/04/2020   GFRAA >60 05/04/2020   GFRNONAA >60 05/04/2020   SPECGRAV >1.030 (H) 01/16/2016   PHUR 5.5 01/16/2016   PROTEINUR NEGATIVE 03/30/2020     Electrolytes Lab  Results  Component Value Date   NA 140 05/04/2020   K 4.1 05/04/2020   CL 103 05/04/2020   CALCIUM 9.2 05/04/2020   MG 1.9 05/04/2020     Hepatic Lab Results  Component Value Date   AST 23 05/04/2020   ALT 20 05/04/2020   ALBUMIN 4.3 05/04/2020   ALKPHOS 72 05/04/2020   LIPASE 102 (H) 08/06/2019     ID Lab Results  Component Value Date   SARSCOV2NAA NEGATIVE 09/17/2019   PREGTESTUR NEGATIVE 04/15/2015     Bone Lab Results  Component Value Date   VD25OH 34.78 05/04/2020     Endocrine Lab Results  Component Value Date   GLUCOSE 95 05/04/2020   GLUCOSEU NEGATIVE 03/30/2020   HGBA1C 5.9 (H) 08/06/2019   TSH 0.801 08/06/2019     Neuropathy Lab Results  Component Value Date   VITAMINB12 447 05/04/2020   HGBA1C 5.9 (H) 08/06/2019     CNS No results found.   Inflammation (CRP: Acute  ESR: Chronic) Lab Results  Component Value Date   ESRSEDRATE 15 05/04/2020     Rheumatology No results found.   Coagulation Lab Results  Component Value Date   PLT 265 03/29/2020     Cardiovascular Lab Results  Component Value Date   TROPONINI <0.03 03/03/2019   HGB 13.6 03/29/2020   HCT 40.5 03/29/2020     Screening Lab Results  Component Value Date   SARSCOV2NAA NEGATIVE 09/17/2019   PREGTESTUR NEGATIVE 04/15/2015     Cancer No results found.   Allergens No results found.     Note: Lab results reviewed.  Recent Diagnostic Imaging Review  Cervical Imaging: Cervical DG Bending/F/E views: Results for orders placed during the hospital encounter of 05/04/20  DG Cervical Spine With Flex & Extend  Narrative CLINICAL DATA:  Neck pain radiating to the left, no known injury, initial encounter  EXAM: CERVICAL SPINE COMPLETE WITH FLEXION AND EXTENSION VIEWS  COMPARISON:  None.  FINDINGS: Seven cervical segments are well visualized. Vertebral body height is well maintained. Alignment is within normal limits. Flexion and extension views show no evidence of  instability. The neural foramina are widely patent bilaterally. The odontoid is within normal limits. No acute fracture is seen. No soft tissue abnormality is noted.  IMPRESSION: No acute abnormality noted. No instability on flexion  and extension.   Electronically Signed By: Inez Catalina M.D. On: 05/04/2020 20:48  Lumbosacral Imaging: Lumbar MR wo contrast: Results for orders placed during the hospital encounter of 04/21/20  MR LUMBAR SPINE WO CONTRAST  Narrative CLINICAL DATA:  49 year old female with low back pain since a fall last year. Prior spinal injections.  EXAM: MRI LUMBAR SPINE WITHOUT CONTRAST  TECHNIQUE: Multiplanar, multisequence MR imaging of the lumbar spine was performed. No intravenous contrast was administered.  COMPARISON:  Lumbar MRI 06/30/2019.  Lumbar radiographs 04/06/2019.  FINDINGS: Segmentation: Otherwise hypoplastic ribs assumed normal lumbar segmentation at T12 with, and this is the same numbering system used on the prior MRI.  Alignment:  Stable lumbar lordosis.  Vertebrae: No marrow edema or evidence of acute osseous abnormality. Visualized bone marrow signal is within normal limits. Intact visible sacrum and SI joints.  Conus medullaris and cauda equina: Conus extends to the T12-L1 level. No lower spinal cord or conus signal abnormality.  Paraspinal and other soft tissues: Negative.  Disc levels:  T11-T12 through L3-L4 remain negative.  L4-L5: Chronic disc desiccation and circumferential disc bulge. Chronic posterior annular fissure of the disc, although less apparent on axial images today. And the posterior component of disc appears less eccentric to the left. Borderline to mild facet hypertrophy. Left lateral recess patency appears improved since last year with no spinal or convincing lateral recess stenosis now. No foraminal stenosis.  L5-S1:  Negative.  IMPRESSION: Isolated lumbar disc degeneration at L4-L5 appears mildly  improved from the MRI last year. No convincing neural impingement at this time.   Electronically Signed By: Genevie Ann M.D. On: 04/21/2020 22:57  Results for orders placed during the hospital encounter of 06/30/19  MR LUMBAR SPINE WO CONTRAST  Narrative CLINICAL DATA:  Initial evaluation for low back pain with bilateral leg pain for 2 months. History of fall and back injury.  EXAM: MRI LUMBAR SPINE WITHOUT CONTRAST  TECHNIQUE: Multiplanar, multisequence MR imaging of the lumbar spine was performed. No intravenous contrast was administered.  COMPARISON:  Prior radiograph from 04/06/2019.  FINDINGS: Segmentation: Standard. Lowest well-formed disc labeled the L5-S1 level.  Alignment: Physiologic with preservation of the normal lumbar lordosis. No listhesis.  Vertebrae: Vertebral body height maintained without evidence for acute or chronic fracture. Bone marrow signal intensity mildly heterogeneous but within normal limits. No discrete or worrisome osseous lesions. No abnormal marrow edema.  Conus medullaris and cauda equina: Conus extends to the L1 level. Conus and cauda equina appear normal.  Paraspinal and other soft tissues: Paraspinous soft tissues within normal limits. Visualized visceral structures unremarkable.  Disc levels:  L1-2:  Unremarkable.  L2-3: Mild annular disc bulge with disc desiccation. Mild bilateral facet hypertrophy. No canal or foraminal stenosis.  L3-4: Negative interspace. Mild facet hypertrophy. No canal or foraminal stenosis.  L4-5: Mild diffuse disc bulge with disc desiccation. Disc bulging slightly asymmetric to the left. Superimposed central annular fissure. Mild facet and ligament flavum hypertrophy. Resultant mild to moderate bilateral subarticular stenosis, slightly worse on the left. Central canal remains patent. No significant foraminal encroachment.  L5-S1: Negative interspace. Minimal bilateral facet hypertrophy. No canal  or foraminal stenosis. No impingement.  IMPRESSION: 1. Mild disc bulging with facet hypertrophy at L4-5 with resultant mild to moderate left greater than right lateral recess stenosis. Either of the descending L5 nerve roots could be affected. 2. Additional minor noncompressive disc bulging at L2-3 without stenosis or impingement. 3. Mild bilateral facet hypertrophy at L2-3 through L5-S1, which could  contribute to lower back pain.   Electronically Signed By: Jeannine Boga M.D. On: 07/01/2019 05:29  Lumbar DG 2-3 views: Results for orders placed during the hospital encounter of 04/06/19  DG Lumbar Spine 2-3 Views  Narrative CLINICAL DATA:  49 year old female with lower back pain  EXAM: LUMBAR SPINE - 2-3 VIEW  COMPARISON:  CT 07/29/2015  FINDINGS: Lumbar Spine:  Lumbar vertebral elements maintain normal alignment without evidence of anterolisthesis, retrolisthesis, subluxation.  No acute fracture line identified.  Vertebral body heights maintained.  Disc space heights maintained with no significant degenerative disc disease or endplate changes.  No significant facet changes.  Unremarkable appearance of the visualized abdomen.  IMPRESSION: Negative for acute fracture or malalignment of the lumbar spine   Electronically Signed By: Corrie Mckusick D.O. On: 04/06/2019 12:01  Lumbar DG Bending views: Results for orders placed during the hospital encounter of 05/04/20  DG Lumbar Spine Complete W/Bend  Narrative CLINICAL DATA:  Low back pain  EXAM: LUMBAR SPINE - COMPLETE WITH BENDING VIEWS  COMPARISON:  MRI 04/21/2020  FINDINGS: There is no evidence of lumbar spine fracture. Alignment is normal. No dynamic listhesis with flexion or extension. Intervertebral disc spaces are maintained. No significant facet joint arthropathy.  IMPRESSION: Negative.   Electronically Signed By: Davina Poke D.O. On: 05/05/2020 20:55         Wrist  Imaging: Wrist-R DG Complete: Results for orders placed during the hospital encounter of 10/12/15  DG Wrist Complete Right  Narrative CLINICAL DATA:  Injured right wrist at work 2 weeks ago. Complaining of pain and swelling.  EXAM: RIGHT WRIST - COMPLETE 3+ VIEW  COMPARISON:  None.  FINDINGS: There is no evidence of fracture or dislocation. There is no evidence of arthropathy or other focal bone abnormality. Soft tissues are unremarkable.  IMPRESSION: Negative.   Electronically Signed By: Lajean Manes M.D. On: 10/12/2015 16:02  Complexity Note: Imaging results reviewed. Results shared with Ms. Martinec, using Layman's terms.                        Meds   Current Outpatient Medications:  .  acetaminophen (TYLENOL) 500 MG tablet, Take 2 tablets (1,000 mg total) by mouth every 8 (eight) hours., Disp: 90 tablet, Rfl: 2 .  Ascorbic Acid (VITAMIN C) 1000 MG tablet, Take 1,000 mg by mouth daily., Disp: , Rfl:  .  DULoxetine (CYMBALTA) 20 MG capsule, Take 20 mg by mouth daily., Disp: , Rfl:  .  loratadine (CLARITIN) 10 MG tablet, Take 1 tablet (10 mg total) by mouth daily., Disp: 30 tablet, Rfl: 0 .  meloxicam (MOBIC) 15 MG tablet, Take 15 mg by mouth daily., Disp: , Rfl:  .  metaxalone (SKELAXIN) 800 MG tablet, Take 800 mg by mouth in the morning and at bedtime., Disp: , Rfl:  .  Multiple Vitamin (MULTIVITAMIN WITH MINERALS) TABS tablet, Take 1 tablet by mouth daily., Disp: , Rfl:  .  omeprazole (PRILOSEC) 20 MG capsule, Take 20 mg by mouth daily., Disp: , Rfl:  .  ondansetron (ZOFRAN ODT) 4 MG disintegrating tablet, Take 1 tablet (4 mg total) by mouth every 8 (eight) hours as needed for nausea or vomiting., Disp: 20 tablet, Rfl: 0 .  zolpidem (AMBIEN) 5 MG tablet, Take 5 mg by mouth at bedtime as needed for sleep., Disp: , Rfl:   ROS  Constitutional: Denies any fever or chills Gastrointestinal: No reported hemesis, hematochezia, vomiting, or acute GI distress Musculoskeletal:  Denies any acute onset joint swelling, redness, loss of ROM, or weakness Neurological: No reported episodes of acute onset apraxia, aphasia, dysarthria, agnosia, amnesia, paralysis, loss of coordination, or loss of consciousness  Allergies  Ms. Crossen is allergic to ciprodex [ciprofloxacin-dexamethasone], hydrocodone, oxycodone, and sulfamethoxazole-trimethoprim.  PFSH  Drug: Ms. Fogarty  reports no history of drug use. Alcohol:  reports no history of alcohol use. Tobacco:  reports that she has never smoked. She has never used smokeless tobacco. Medical:  has a past medical history of Abdominal pain (10/08/2013), Abnormal uterine bleeding (03/24/2015), Anemia, Calculus of kidney (01/25/2015), Chronic pelvic pain in female (04/06/2015), Dermoid cyst of ovary (04/06/2015), Episode of syncope (10/08/2013), and Knee pain. Surgical: Ms. Loughney  has a past surgical history that includes Cesarean section; Hand Tenodesis; Laparoscopic assisted vaginal hysterectomy (N/A, 04/15/2015); Laparoscopic salpingo oophorectomy (Bilateral, 04/15/2015); Cystoscopy (N/A, 04/15/2015); Cystoscopy with retrograde pyelogram, ureteroscopy and stent placement (Left, 12/28/2015); Abdominal hysterectomy; Ureteroscopy with holmium laser lithotripsy (Left, 01/18/2016); Cystoscopy with stent placement (Left, 01/18/2016); Cholecystectomy (N/A, 08/07/2019); and Knee arthroscopy with lateral release (Right, 09/21/2019). Family: family history is not on file.  Constitutional Exam  General appearance: Well nourished, well developed, and well hydrated. In no apparent acute distress Vitals:   06/13/20 1352  BP: (!) 155/67  Pulse: 74  Resp: 17  Temp: 98.6 F (37 C)  TempSrc: Temporal  SpO2: 100%  Weight: 120 lb (54.4 kg)  Height: 5' 2" (1.575 m)   BMI Assessment: Estimated body mass index is 21.95 kg/m as calculated from the following:   Height as of this encounter: 5' 2" (1.575 m).   Weight as of this encounter: 120 lb (54.4 kg).  BMI  interpretation table: BMI level Category Range association with higher incidence of chronic pain  <18 kg/m2 Underweight   18.5-24.9 kg/m2 Ideal body weight   25-29.9 kg/m2 Overweight Increased incidence by 20%  30-34.9 kg/m2 Obese (Class I) Increased incidence by 68%  35-39.9 kg/m2 Severe obesity (Class II) Increased incidence by 136%  >40 kg/m2 Extreme obesity (Class III) Increased incidence by 254%   Patient's current BMI Ideal Body weight  Body mass index is 21.95 kg/m. Ideal body weight: 50.1 kg (110 lb 7.2 oz) Adjusted ideal body weight: 51.8 kg (114 lb 4.3 oz)   BMI Readings from Last 4 Encounters:  06/13/20 21.95 kg/m  05/04/20 21.95 kg/m  03/29/20 21.95 kg/m  09/21/19 20.56 kg/m   Wt Readings from Last 4 Encounters:  06/13/20 120 lb (54.4 kg)  05/04/20 120 lb (54.4 kg)  03/29/20 120 lb (54.4 kg)  09/21/19 112 lb 7 oz (51 kg)   Psych/Mental status: Alert, oriented x 3 (person, place, & time)       Eyes: PERLA Respiratory: No evidence of acute respiratory distress  Assessment & Plan  Primary Diagnosis & Pertinent Problem List: The primary encounter diagnosis was Chronic pain syndrome. Diagnoses of Chronic low back pain (1ry area of Pain) (Bilateral) (R>L) w/ sciatica (Right), Chronic lower extremity pain (2ry area of Pain) (Bilateral) (R>L), and Inflammatory spondylopathy of lumbosacral region [facility]) were also pertinent to this visit.  Visit Diagnosis: 1. Chronic pain syndrome   2. Chronic low back pain (1ry area of Pain) (Bilateral) (R>L) w/ sciatica (Right)   3. Chronic lower extremity pain (2ry area of Pain) (Bilateral) (R>L)   4. Inflammatory spondylopathy of lumbosacral region [facility])    Problems updated and reviewed during this visit: No problems updated.  Plan of Care  Pharmacotherapy (Medications Ordered): No orders of the  defined types were placed in this encounter.   Procedure Orders     Lumbar Epidural Injection Lab Orders  No laboratory test(s)  ordered today   Imaging Orders  No imaging studies ordered today   Referral Orders  No referral(s) requested today   Pharmacological management options:  Opioid Analgesics: I will not be prescribing any opioids at this time Membrane stabilizer: None prescribed at this time Muscle relaxant: None prescribed at this time NSAID: None prescribed at this time Other analgesic(s): None prescribed at this time    Interventional management options: Planned, scheduled, and/or pending:    Diagnostic right-sided L4-5 LESI #1 under fluoroscopic guidance and IV sedation   Considering:   Diagnostic right L4-5 LESI  Diagnostic L4-5 provocative discogram  Possible L4-5 IDET (intradiscal electrothermal coagulation)  Possible radiofrequency denervation of the L4-5 intervertebral disc    PRN Procedures:   None at this time    Provider-requested follow-up: Return for Procedure (w/ sedation): (R) L4-5 LESI #1. Recent Visits Date Type Provider Dept  05/04/20 Office Visit Milinda Pointer, MD [facility]  Showing recent visits within past 90 days and meeting all other requirements Today's Visits Date Type Provider Dept  06/13/20 Office Visit Milinda Pointer, MD [facility]  Showing today's visits and meeting all other requirements Future Appointments Date Type Provider Dept  06/21/20 Appointment Milinda Pointer, MD [facility]  Showing future appointments within next 90 days and meeting all other requirements  Primary Care Physician: Gladstone Lighter, MD Note by: Gaspar Cola, MD Date: 06/13/2020; Time: 3:56 PM

## 2020-06-13 ENCOUNTER — Ambulatory Visit: Payer: Federal, State, Local not specified - PPO | Attending: Pain Medicine | Admitting: Pain Medicine

## 2020-06-13 ENCOUNTER — Other Ambulatory Visit: Payer: Self-pay

## 2020-06-13 ENCOUNTER — Encounter: Payer: Self-pay | Admitting: Pain Medicine

## 2020-06-13 VITALS — BP 155/67 | HR 74 | Temp 98.6°F | Resp 17 | Ht 62.0 in | Wt 120.0 lb

## 2020-06-13 DIAGNOSIS — M4697 Unspecified inflammatory spondylopathy, lumbosacral region: Secondary | ICD-10-CM | POA: Diagnosis present

## 2020-06-13 DIAGNOSIS — M79604 Pain in right leg: Secondary | ICD-10-CM | POA: Insufficient documentation

## 2020-06-13 DIAGNOSIS — G894 Chronic pain syndrome: Secondary | ICD-10-CM | POA: Insufficient documentation

## 2020-06-13 DIAGNOSIS — M79605 Pain in left leg: Secondary | ICD-10-CM | POA: Insufficient documentation

## 2020-06-13 DIAGNOSIS — G8929 Other chronic pain: Secondary | ICD-10-CM | POA: Insufficient documentation

## 2020-06-13 DIAGNOSIS — M5441 Lumbago with sciatica, right side: Secondary | ICD-10-CM | POA: Diagnosis present

## 2020-06-13 NOTE — Patient Instructions (Signed)
____________________________________________________________________________________________  Preparing for Procedure with Sedation  Procedure appointments are limited to planned procedures: . No Prescription Refills. . No disability issues will be discussed. . No medication changes will be discussed.  Instructions: . Oral Intake: Do not eat or drink anything for at least 8 hours prior to your procedure. (Exception: Blood Pressure Medication. See below.) . Transportation: Unless otherwise stated by your physician, you may drive yourself after the procedure. . Blood Pressure Medicine: Do not forget to take your blood pressure medicine with a sip of water the morning of the procedure. If your Diastolic (lower reading)is above 100 mmHg, elective cases will be cancelled/rescheduled. . Blood thinners: These will need to be stopped for procedures. Notify our staff if you are taking any blood thinners. Depending on which one you take, there will be specific instructions on how and when to stop it. . Diabetics on insulin: Notify the staff so that you can be scheduled 1st case in the morning. If your diabetes requires high dose insulin, take only  of your normal insulin dose the morning of the procedure and notify the staff that you have done so. . Preventing infections: Shower with an antibacterial soap the morning of your procedure. . Build-up your immune system: Take 1000 mg of Vitamin C with every meal (3 times a day) the day prior to your procedure. . Antibiotics: Inform the staff if you have a condition or reason that requires you to take antibiotics before dental procedures. . Pregnancy: If you are pregnant, call and cancel the procedure. . Sickness: If you have a cold, fever, or any active infections, call and cancel the procedure. . Arrival: You must be in the facility at least 30 minutes prior to your scheduled procedure. . Children: Do not bring children with you. . Dress appropriately:  Bring dark clothing that you would not mind if they get stained. . Valuables: Do not bring any jewelry or valuables.  Reasons to call and reschedule or cancel your procedure: (Following these recommendations will minimize the risk of a serious complication.) . Surgeries: Avoid having procedures within 2 weeks of any surgery. (Avoid for 2 weeks before or after any surgery). . Flu Shots: Avoid having procedures within 2 weeks of a flu shots or . (Avoid for 2 weeks before or after immunizations). . Barium: Avoid having a procedure within 7-10 days after having had a radiological study involving the use of radiological contrast. (Myelograms, Barium swallow or enema study). . Heart attacks: Avoid any elective procedures or surgeries for the initial 6 months after a "Myocardial Infarction" (Heart Attack). . Blood thinners: It is imperative that you stop these medications before procedures. Let us know if you if you take any blood thinner.  . Infection: Avoid procedures during or within two weeks of an infection (including chest colds or gastrointestinal problems). Symptoms associated with infections include: Localized redness, fever, chills, night sweats or profuse sweating, burning sensation when voiding, cough, congestion, stuffiness, runny nose, sore throat, diarrhea, nausea, vomiting, cold or Flu symptoms, recent or current infections. It is specially important if the infection is over the area that we intend to treat. . Heart and lung problems: Symptoms that may suggest an active cardiopulmonary problem include: cough, chest pain, breathing difficulties or shortness of breath, dizziness, ankle swelling, uncontrolled high or unusually low blood pressure, and/or palpitations. If you are experiencing any of these symptoms, cancel your procedure and contact your primary care physician for an evaluation.  Remember:  Regular Business hours are:    Monday to Thursday 8:00 AM to 4:00 PM  Provider's  Schedule: Jermar Colter, MD:  Procedure days: Tuesday and Thursday 7:30 AM to 4:00 PM  Bilal Lateef, MD:  Procedure days: Monday and Wednesday 7:30 AM to 4:00 PM ____________________________________________________________________________________________    

## 2020-06-21 ENCOUNTER — Encounter: Payer: Self-pay | Admitting: Pain Medicine

## 2020-06-21 ENCOUNTER — Ambulatory Visit (HOSPITAL_BASED_OUTPATIENT_CLINIC_OR_DEPARTMENT_OTHER): Payer: Federal, State, Local not specified - PPO | Admitting: Pain Medicine

## 2020-06-21 ENCOUNTER — Other Ambulatory Visit: Payer: Self-pay

## 2020-06-21 ENCOUNTER — Ambulatory Visit
Admission: RE | Admit: 2020-06-21 | Discharge: 2020-06-21 | Disposition: A | Discharge status: Home / Self Care | Payer: Federal, State, Local not specified - PPO | Source: Ambulatory Visit | Attending: Pain Medicine | Admitting: Pain Medicine

## 2020-06-21 VITALS — BP 147/82 | HR 60 | Temp 96.8°F | Resp 18 | Ht 62.0 in | Wt 120.0 lb

## 2020-06-21 DIAGNOSIS — G8929 Other chronic pain: Secondary | ICD-10-CM | POA: Insufficient documentation

## 2020-06-21 DIAGNOSIS — M79604 Pain in right leg: Secondary | ICD-10-CM | POA: Diagnosis present

## 2020-06-21 DIAGNOSIS — M5136 Other intervertebral disc degeneration, lumbar region: Secondary | ICD-10-CM | POA: Diagnosis present

## 2020-06-21 DIAGNOSIS — M5417 Radiculopathy, lumbosacral region: Secondary | ICD-10-CM | POA: Insufficient documentation

## 2020-06-21 DIAGNOSIS — M5441 Lumbago with sciatica, right side: Secondary | ICD-10-CM | POA: Insufficient documentation

## 2020-06-21 DIAGNOSIS — M79605 Pain in left leg: Secondary | ICD-10-CM | POA: Insufficient documentation

## 2020-06-21 DIAGNOSIS — M5137 Other intervertebral disc degeneration, lumbosacral region: Secondary | ICD-10-CM | POA: Diagnosis not present

## 2020-06-21 MED ORDER — FENTANYL CITRATE (PF) 100 MCG/2ML IJ SOLN
INTRAMUSCULAR | Status: AC
  Filled 2020-06-21: qty 2

## 2020-06-21 MED ORDER — SODIUM CHLORIDE (PF) 0.9 % IJ SOLN
INTRAMUSCULAR | Status: AC
  Filled 2020-06-21: qty 10

## 2020-06-21 MED ORDER — LIDOCAINE HCL 2 % IJ SOLN
20.0000 mL | Freq: Once | INTRAMUSCULAR | Status: AC
  Administered 2020-06-21: 400 mg

## 2020-06-21 MED ORDER — ROPIVACAINE HCL 2 MG/ML IJ SOLN
2.0000 mL | Freq: Once | INTRAMUSCULAR | Status: AC
  Administered 2020-06-21: 2 mL via EPIDURAL

## 2020-06-21 MED ORDER — SODIUM CHLORIDE 0.9% FLUSH: 2.0000 mL | Freq: Once | INTRAVENOUS | Status: DC

## 2020-06-21 MED ORDER — LIDOCAINE HCL 2 % IJ SOLN
INTRAMUSCULAR | Status: AC
  Filled 2020-06-21: qty 20

## 2020-06-21 MED ORDER — FENTANYL CITRATE (PF) 100 MCG/2ML IJ SOLN
25.0000 ug | INTRAMUSCULAR | Status: DC | PRN
  Administered 2020-06-21: 50 ug via INTRAVENOUS

## 2020-06-21 MED ORDER — MIDAZOLAM HCL 5 MG/5ML IJ SOLN
1.0000 mg | INTRAMUSCULAR | Status: DC | PRN
  Administered 2020-06-21: 1 mg via INTRAVENOUS

## 2020-06-21 MED ORDER — IOHEXOL 180 MG/ML  SOLN
10.0000 mL | Freq: Once | INTRAMUSCULAR | Status: AC
  Administered 2020-06-21: 10 mL via EPIDURAL

## 2020-06-21 MED ORDER — ROPIVACAINE HCL 2 MG/ML IJ SOLN
INTRAMUSCULAR | Status: AC
  Filled 2020-06-21: qty 10

## 2020-06-21 MED ORDER — LACTATED RINGERS IV SOLN
1000.0000 mL | Freq: Once | INTRAVENOUS | Status: AC
  Administered 2020-06-21: 1000 mL via INTRAVENOUS

## 2020-06-21 MED ORDER — MIDAZOLAM HCL 5 MG/5ML IJ SOLN
INTRAMUSCULAR | Status: AC
  Filled 2020-06-21: qty 5

## 2020-06-21 MED ORDER — TRIAMCINOLONE ACETONIDE 40 MG/ML IJ SUSP
40.0000 mg | Freq: Once | INTRAMUSCULAR | Status: AC
  Administered 2020-06-21: 40 mg

## 2020-06-21 MED ORDER — TRIAMCINOLONE ACETONIDE 40 MG/ML IJ SUSP
INTRAMUSCULAR | Status: AC
  Filled 2020-06-21: qty 1

## 2020-06-21 NOTE — Progress Notes (Signed)
Safety precautions to be maintained throughout the outpatient stay will include: orient to surroundings, keep bed in low position, maintain call bell within reach at all times, provide assistance with transfer out of bed and ambulation.  

## 2020-06-21 NOTE — Progress Notes (Signed)
PROVIDER NOTE: Information contained herein reflects review and annotations entered in association with encounter. Interpretation of such information and data should be left to medically-trained personnel. Information provided to patient can be located elsewhere in the medical record under "Patient Instructions". Document created using STT-dictation technology, any transcriptional errors that may result from process are unintentional.    Patient: Gail [name]  Service Category: Procedure  Provider: Gaspar Cola, MD  DOB: 1971/02/25  DOS: 06/21/2020  Location: [facility]  MRN: [id number]  Setting: Ambulatory - outpatient  Referring Provider: Gladstone Lighter, MD  Type: Established Patient  Specialty: Interventional Pain Management  PCP: Gladstone Lighter, MD   Primary Reason for Visit: Interventional Pain Management Treatment. CC: Back Pain (lower)  Procedure:          Anesthesia, Analgesia, Anxiolysis:  Type: Therapeutic Inter-Laminar Epidural Steroid Injection  #1  Region: Lumbar Level: L4-5 Level. Laterality: Right-Sided Paramedial  Type: Moderate (Conscious) Sedation combined with Local Anesthesia Indication(s): Analgesia and Anxiety Route: Intravenous (IV) IV Access: Secured Sedation: Meaningful verbal contact was maintained at all times during the procedure  Local Anesthetic: Lidocaine 1-2%  Position: Prone with head of the table was raised to facilitate breathing.   Indications: 1. DDD (degenerative disc disease), lumbosacral   2. Lumbosacral radiculopathy at L5 (Right)   3. Chronic low back pain (1ry area of Pain) (Bilateral) (R>L) w/ sciatica (Right)   4. Chronic lower extremity pain (2ry area of Pain) (Bilateral) (R>L)   5. 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 (year old), female patient, seen today for interventional treatment. She   has a past surgical history that includes Cesarean section; Hand Tenodesis; Laparoscopic assisted vaginal hysterectomy (N/A, 04/15/2015); Laparoscopic salpingo oophorectomy (Bilateral, 04/15/2015); Cystoscopy (N/A, 04/15/2015); Cystoscopy with retrograde pyelogram, ureteroscopy and stent placement (Left, 12/28/2015); Abdominal hysterectomy; Ureteroscopy with holmium laser lithotripsy (Left, 01/18/2016); Cystoscopy with stent placement (Left, 01/18/2016); Cholecystectomy (N/A, 08/07/2019); and Knee arthroscopy with lateral release (Right, 09/21/2019). Ms. Boehlke has a current medication list which includes the following prescription(s): acetaminophen, vitamin c, duloxetine, loratadine, meloxicam, metaxalone, multivitamin with minerals, omeprazole, ondansetron, and zolpidem, and the following Facility-Administered Medications: fentanyl, midazolam, and sodium chloride flush. Her primarily concern today is the Back Pain (lower)  Initial Vital Signs:  Pulse/HCG Rate: (!) 58ECG Heart Rate: (!) 59 Temp: (!) 97.3 F (36.3 C) Resp: 14 BP: (!) 163/73 SpO2: 100 %  BMI: Estimated body mass index is 21.95 kg/m as calculated from the following:   Height as of this encounter: 5\' 2"  (1.575 m).   Weight as of this encounter: 120 lb (54.4 kg).  Risk Assessment: Allergies: Reviewed. She is allergic to ciprodex [ciprofloxacin-dexamethasone], hydrocodone, oxycodone, and sulfamethoxazole-trimethoprim.  Allergy Precautions: None required Coagulopathies: Reviewed. None identified.  Blood-thinner therapy: None at this time Active Infection(s): Reviewed. None identified. Gail [name] is afebrile  Site Confirmation: Ms. Leaks was asked to confirm the procedure and laterality before marking the site Procedure checklist: Completed Consent: Before the procedure and under the influence of no sedative(s), amnesic(s), or anxiolytics, the patient was informed of the treatment options, risks and possible complications. To fulfill  our ethical and legal obligations, as recommended by the American Medical Association's Code of Ethics, I have informed the patient of my clinical impression; the nature and purpose of the treatment or procedure; the risks, benefits, and possible complications of the intervention; the alternatives, including  doing nothing; the risk(s) and benefit(s) of the alternative treatment(s) or procedure(s); and the risk(s) and benefit(s) of doing nothing. The patient was provided information about the general risks and possible complications associated with the procedure. These may include, but are not limited to: failure to achieve desired goals, infection, bleeding, organ or nerve damage, allergic reactions, paralysis, and death. In addition, the patient was informed of those risks and complications associated to Spine-related procedures, such as failure to decrease pain; infection (i.e.: Meningitis, epidural or intraspinal abscess); bleeding (i.e.: epidural hematoma, subarachnoid hemorrhage, or any other type of intraspinal or peri-dural bleeding); organ or nerve damage (i.e.: Any type of peripheral nerve, nerve root, or spinal cord injury) with subsequent damage to sensory, motor, and/or autonomic systems, resulting in permanent pain, numbness, and/or weakness of one or several areas of the body; allergic reactions; (i.e.: anaphylactic reaction); and/or death. Furthermore, the patient was informed of those risks and complications associated with the medications. These include, but are not limited to: allergic reactions (i.e.: anaphylactic or anaphylactoid reaction(s)); adrenal axis suppression; blood sugar elevation that in diabetics may result in ketoacidosis or comma; water retention that in patients with history of congestive heart failure may result in shortness of breath, pulmonary edema, and decompensation with resultant heart failure; weight gain; swelling or edema; medication-induced neural toxicity;  particulate matter embolism and blood vessel occlusion with resultant organ, and/or nervous system infarction; and/or aseptic necrosis of one or more joints. Finally, the patient was informed that Medicine is not an exact science; therefore, there is also the possibility of unforeseen or unpredictable risks and/or possible complications that may result in a catastrophic outcome. The patient indicated having understood very clearly. We have given the patient no guarantees and we have made no promises. Enough time was given to the patient to ask questions, all of which were answered to the patient's satisfaction. Ms. Wilcher has indicated that she wanted to continue with the procedure. Attestation: I, the ordering provider, attest that I have discussed with the patient the benefits, risks, side-effects, alternatives, likelihood of achieving goals, and potential problems during recovery for the procedure that I have provided informed consent. Date  Time: 06/21/2020  8:27 AM  Pre-Procedure Preparation:  Monitoring: As per clinic protocol. Respiration, ETCO2, SpO2, BP, heart rate and rhythm monitor placed and checked for adequate function Safety Precautions: Patient was assessed for positional comfort and pressure points before starting the procedure. Time-out: I initiated and conducted the "Time-out" before starting the procedure, as per protocol. The patient was asked to participate by confirming the accuracy of the "Time Out" information. Verification of the correct person, site, and procedure were performed and confirmed by me, the nursing staff, and the patient. "Time-out" conducted as per Joint Commission's Universal Protocol (UP.01.01.01). Time: 8341  Description of Procedure:          Target Area: The interlaminar space, initially targeting the lower laminar border of the superior vertebral body. Approach: Paramedial approach. Area Prepped: Entire Posterior Lumbar Region DuraPrep (Iodine Povacrylex  [0.7% available iodine] and Isopropyl Alcohol, 74% w/w) Safety Precautions: Aspiration looking for blood return was conducted prior to all injections. At no point did we inject any substances, as a needle was being advanced. No attempts were made at seeking any paresthesias. Safe injection practices and needle disposal techniques used. Medications properly checked for expiration dates. SDV (single dose vial) medications used. Description of the Procedure: Protocol guidelines were followed. The procedure needle was introduced through the skin, ipsilateral to the reported  pain, and advanced to the target area. Bone was contacted and the needle walked caudad, until the lamina was cleared. The epidural space was identified using "loss-of-resistance technique" with 2-3 ml of PF-NaCl (0.9% NSS), in a 5cc LOR glass syringe.  Vitals:   06/21/20 0903 06/21/20 0913 06/21/20 0923 06/21/20 0934  BP: (!) 148/90 (!) 151/88 (!) 148/77 (!) 147/82  Pulse: 60     Resp:  15 16 18   Temp:    (!) 96.8 F (36 C)  TempSrc:    Temporal  SpO2: 98% 99% 99% 99%  Weight:      Height:        Start Time: 0857 hrs. End Time: 0902 hrs.  Materials:  Needle(s) Type: Epidural needle Gauge: 17G Length: 3.5-in Medication(s): Please see orders for medications and dosing details.  Imaging Guidance (Spinal):          Type of Imaging Technique: Fluoroscopy Guidance (Spinal) Indication(s): Assistance in needle guidance and placement for procedures requiring needle placement in or near specific anatomical locations not easily accessible without such assistance. Exposure Time: Please see nurses notes. Contrast: Before injecting any contrast, we confirmed that the patient did not have an allergy to iodine, shellfish, or radiological contrast. Once satisfactory needle placement was completed at the desired level, radiological contrast was injected. Contrast injected under live fluoroscopy. No contrast complications. See chart for  type and volume of contrast used. Fluoroscopic Guidance: I was personally present during the use of fluoroscopy. "Tunnel Vision Technique" used to obtain the best possible view of the target area. Parallax error corrected before commencing the procedure. "Direction-depth-direction" technique used to introduce the needle under continuous pulsed fluoroscopy. Once target was reached, antero-posterior, oblique, and lateral fluoroscopic projection used confirm needle placement in all planes. Images permanently stored in EMR. Interpretation: I personally interpreted the imaging intraoperatively. Adequate needle placement confirmed in multiple planes. Appropriate spread of contrast into desired area was observed. No evidence of afferent or efferent intravascular uptake. No intrathecal or subarachnoid spread observed. Permanent images saved into the patient's record.  Antibiotic Prophylaxis:   Anti-infectives (From admission, onward)   None     Indication(s): None identified  Post-operative Assessment:  Post-procedure Vital Signs:  Pulse/HCG Rate: 60(!) 55 Temp: (!) 96.8 F (36 C) Resp: 18 BP: (!) 147/82 SpO2: 99 %  EBL: None  Complications: No immediate post-treatment complications observed by team, or reported by patient.  Note: The patient tolerated the entire procedure well. A repeat set of vitals were taken after the procedure and the patient was kept under observation following institutional policy, for this type of procedure. Post-procedural neurological assessment was performed, showing return to baseline, prior to discharge. The patient was provided with post-procedure discharge instructions, including a section on how to identify potential problems. Should any problems arise concerning this procedure, the patient was given instructions to immediately contact us, at any time, without hesitation. In any case, we plan to contact the patient by telephone for a follow-up status report regarding  this interventional procedure.  Comments:  No additional relevant information.  Plan of Care  Orders:  Orders Placed This Encounter  Procedures  . Lumbar Epidural Injection    Scheduling Instructions:     Procedure: Interlaminar LESI L4-5     Laterality: Right-sided     Sedation: Patient's choice     Timeframe:  Today    Order Specific Question:   Where will this procedure be performed?    Answer:   [facility]  .  [facility] C-ARM 1-60 MIN NO REPORT    Intraoperative interpretation by procedural physician at [place].    Standing Status:   Standing    Number of Occurrences:   1    Order Specific Question:   Reason for exam:    Answer:   Assistance in needle guidance and placement for procedures requiring needle placement in or near specific anatomical locations not easily accessible without such assistance.  . Informed Consent Details: Physician/Practitioner Attestation; Transcribe to consent form and obtain patient signature    Provider Attestation: I, [place] Dossie Arbour, MD, (Pain Management Specialist), the physician/practitioner, attest that I have discussed with the patient the benefits, risks, side effects, alternatives, likelihood of achieving goals and potential problems during recovery for the procedure that I have provided informed consent.    Scheduling Instructions:     Procedure: Lumbar epidural steroid injection under fluoroscopic guidance     Indications: Low back and/or lower extremity pain secondary to lumbar radiculitis     Note: Always confirm laterality of pain with Ms. Gravett, before procedure.     Transcribe to consent form and obtain patient signature.  . Provide equipment / supplies at bedside    Equipment required: Single use, disposable, "Epidural Tray" Epidural Catheter: NOT required    Standing Status:   Standing    Number of Occurrences:   1    Order Specific Question:   Specify    Answer:   Epidural Tray   Chronic Opioid  Analgesic:  None Highest recorded MME/day: 25 mg/day MME/day: 0 mg/day   Medications ordered for procedure: Meds ordered this encounter  Medications  . iohexol (OMNIPAQUE) 180 MG/ML injection 10 mL    Must be Myelogram-compatible. If not available, you may substitute with a water-soluble, non-ionic, hypoallergenic, myelogram-compatible radiological contrast medium.  Marland Kitchen lidocaine (XYLOCAINE) 2 % (with pres) injection 400 mg  . lactated ringers infusion 1,000 mL  . midazolam (VERSED) 5 MG/5ML injection 1-2 mg    Make sure Flumazenil is available in the pyxis when using this medication. If oversedation occurs, administer 0.2 mg IV over 15 sec. If after 45 sec no response, administer 0.2 mg again over 1 min; may repeat at 1 min intervals; not to exceed 4 doses (1 mg)  . fentaNYL (SUBLIMAZE) injection 25-50 mcg    Make sure Narcan is available in the pyxis when using this medication. In the event of respiratory depression (RR< 8/min): Titrate NARCAN (naloxone) in increments of 0.1 to 0.2 mg IV at 2-3 minute intervals, until desired degree of reversal.  . sodium chloride flush (NS) 0.9 % injection 2 mL  . ropivacaine (PF) 2 mg/mL (0.2%) (NAROPIN) injection 2 mL  . triamcinolone acetonide (KENALOG-40) injection 40 mg   Medications administered: We administered iohexol, lidocaine, lactated ringers, midazolam, fentaNYL, ropivacaine (PF) 2 mg/mL (0.2%), and triamcinolone acetonide.  See the medical record for exact dosing, route, and time of administration.  Follow-up plan:   Return in about 2 weeks (around 07/05/2020) for 20-min, F2F, PP-(on eval day).       Interventional management options: Planned, scheduled, and/or pending:    Diagnostic right-sided L4-5 LESI #1 under fluoroscopic guidance and IV sedation   Considering:   Diagnostic right L4-5 LESI  Diagnostic L4-5 provocative discogram  Possible L4-5 IDET (intradiscal electrothermal coagulation)  Possible radiofrequency denervation of the  L4-5 intervertebral disc    PRN Procedures:   None at this time     Recent Visits Date Type Provider Dept  06/13/20 Office Visit Milinda Pointer, MD [facility]  05/04/20 Office Visit Milinda Pointer, MD [facility]  Showing recent visits within past 90 days and meeting all other requirements Today's Visits Date Type Provider Dept  06/21/20 Procedure visit Milinda Pointer, MD [facility]  Showing today's visits and meeting all other requirements Future Appointments Date Type Provider Dept  07/18/20 Appointment Milinda Pointer, MD [facility]  Showing future appointments within next 90 days and meeting all other requirements  Disposition: Discharge home  Discharge (Date  Time): 06/21/2020; 0935 hrs.   Primary Care Physician: Gladstone Lighter, MD Location: [facility] Note by: Gaspar Cola, MD Date: 06/21/2020; Time: 10:02 AM  Disclaimer:  Medicine is not an Chief Strategy Officer. The only guarantee in medicine is that nothing is guaranteed. It is important to note that the decision to proceed with this intervention was based on the information collected from the patient. The Data and conclusions were drawn from the patient's questionnaire, the interview, and the physical examination. Because the information was provided in large part by the patient, it cannot be guaranteed that it has not been purposely or unconsciously manipulated. Every effort has been made to obtain as much relevant data as possible for this evaluation. It is important to note that the conclusions that lead to this procedure are derived in large part from the available data. Always take into account that the treatment will also be dependent on availability of resources and existing treatment guidelines, considered by other Pain Management Practitioners as being common knowledge and practice, at the time of the intervention. For Medico-Legal  purposes, it is also important to point out that variation in procedural techniques and pharmacological choices are the acceptable norm. The indications, contraindications, technique, and results of the above procedure should only be interpreted and judged by a Board-Certified Interventional Pain Specialist with extensive familiarity and expertise in the same exact procedure and technique.

## 2020-06-21 NOTE — Patient Instructions (Addendum)
____________________________________________________________________________________________  Post-Procedure Discharge Instructions  Instructions:  Apply ice:   Purpose: This will minimize any swelling and discomfort after procedure.   When: Day of procedure, as soon as you get home.  How: Fill a plastic sandwich bag with crushed ice. Cover it with a small towel and apply to injection site.  How long: (15 min on, 15 min off) Apply for 15 minutes then remove x 15 minutes.  Repeat sequence on day of procedure, until you go to bed.  Apply heat:   Purpose: To treat any soreness and discomfort from the procedure.  When: Starting the next day after the procedure.  How: Apply heat to procedure site starting the day following the procedure.  How long: May continue to repeat daily, until discomfort goes away.  Food intake: Start with clear liquids (like water) and advance to regular food, as tolerated.   Physical activities: Keep activities to a minimum for the first 8 hours after the procedure. After that, then as tolerated.  Driving: If you have received any sedation, be responsible and do not drive. You are not allowed to drive for 24 hours after having sedation.  Blood thinner: (Applies only to those taking blood thinners) You may restart your blood thinner 6 hours after your procedure.  Insulin: (Applies only to Diabetic patients taking insulin) As soon as you can eat, you may resume your normal dosing schedule.  Infection prevention: Keep procedure site clean and dry. Shower daily and clean area with soap and water.  Post-procedure Pain Diary: Extremely important that this be done correctly and accurately. Recorded information will be used to determine the next step in treatment. For the purpose of accuracy, follow these rules:  Evaluate only the area treated. Do not report or include pain from an untreated area. For the purpose of this evaluation, ignore all other areas of pain,  except for the treated area.  After your procedure, avoid taking a long nap and attempting to complete the pain diary after you wake up. Instead, set your alarm clock to go off every hour, on the hour, for the initial 8 hours after the procedure. Document the duration of the numbing medicine, and the relief you are getting from it.  Do not go to sleep and attempt to complete it later. It will not be accurate. If you received sedation, it is likely that you were given a medication that may cause amnesia. Because of this, completing the diary at a later time may cause the information to be inaccurate. This information is needed to plan your care.  Follow-up appointment: Keep your post-procedure follow-up evaluation appointment after the procedure (usually 2 weeks for most procedures, 6 weeks for radiofrequencies). DO NOT FORGET to bring you pain diary with you.   Expect: (What should I expect to see with my procedure?)  From numbing medicine (AKA: Local Anesthetics): Numbness or decrease in pain. You may also experience some weakness, which if present, could last for the duration of the local anesthetic.  Onset: Full effect within 15 minutes of injected.  Duration: It will depend on the type of local anesthetic used. On the average, 1 to 8 hours.   From steroids (Applies only if steroids were used): Decrease in swelling or inflammation. Once inflammation is improved, relief of the pain will follow.  Onset of benefits: Depends on the amount of swelling present. The more swelling, the longer it will take for the benefits to be seen. In some cases, up to 10 days.    Duration: Steroids will stay in the system x 2 weeks. Duration of benefits will depend on multiple posibilities including persistent irritating factors.  Side-effects: If present, they may typically last 2 weeks (the duration of the steroids).  Frequent: Cramps (if they occur, drink Gatorade and take over-the-counter Magnesium 450-500 mg  once to twice a day); water retention with temporary weight gain; increases in blood sugar; decreased immune system response; increased appetite.  Occasional: Facial flushing (red, warm cheeks); mood swings; menstrual changes.  Uncommon: Long-term decrease or suppression of natural hormones; bone thinning. (These are more common with higher doses or more frequent use. This is why we prefer that our patients avoid having any injection therapies in other practices.)   Very Rare: Severe mood changes; psychosis; aseptic necrosis.  From procedure: Some discomfort is to be expected once the numbing medicine wears off. This should be minimal if ice and heat are applied as instructed.  Call if: (When should I call?)  You experience numbness and weakness that gets worse with time, as opposed to wearing off.  New onset bowel or bladder incontinence. (Applies only to procedures done in the spine)  Emergency Numbers:  Durning business hours (Monday - Thursday, 8:00 AM - 4:00 PM) (Friday, 9:00 AM - 12:00 Noon): [phone]  After hours: [phone]  NOTE: If you are having a problem and are unable connect with, or to talk to a provider, then go to your nearest urgent care or emergency department. If the problem is serious and urgent, please call 911. ____________________________________________________________________________________________   Pain Management Discharge Instructions  General Discharge Instructions :  If you need to reach your doctor call: Monday-Friday 8:00 am - 4:00 pm at [phone] or toll free [phone].  After clinic hours [phone] to have operator reach doctor.  Bring all of your medication bottles to all your appointments in the pain clinic.  To cancel or reschedule your appointment with Pain Management please remember to call 24 hours in advance to avoid a fee.  Refer to the educational materials which you have been given on: General Risks, I had my  Procedure. Discharge Instructions, Post Sedation.  Post Procedure Instructions:  The drugs you were given will stay in your system until tomorrow, so for the next 24 hours you should not drive, make any legal decisions or drink any alcoholic beverages.  You may eat anything you prefer, but it is better to start with liquids then soups and crackers, and gradually work up to solid foods.  Please notify your doctor immediately if you have any unusual bleeding, trouble breathing or pain that is not related to your normal pain.  Depending on the type of procedure that was done, some parts of your body may feel week and/or numb.  This usually clears up by tonight or the next day.  Walk with the use of an assistive device or accompanied by an adult for the 24 hours.  You may use ice on the affected area for the first 24 hours.  Put ice in a Ziploc bag and cover with a towel and place against area 15 minutes on 15 minutes off.  You may switch to heat after 24 hours.Epidural Steroid Injection Patient Information  Description: The epidural space surrounds the nerves as they exit the spinal cord.  In some patients, the nerves can be compressed and inflamed by a bulging disc or a tight spinal canal (spinal stenosis).  By injecting steroids into the epidural space, we can bring irritated nerves   into direct contact with a potentially helpful medication.  These steroids act directly on the irritated nerves and can reduce swelling and inflammation which often leads to decreased pain.  Epidural steroids may be injected anywhere along the spine and from the neck to the low back depending upon the location of your pain.   After numbing the skin with local anesthetic (like Novocaine), a small needle is passed into the epidural space slowly.  You may experience a sensation of pressure while this is being done.  The entire block usually last less than 10 minutes.  Conditions which may be treated by epidural  steroids:   Low back and leg pain  Neck and arm pain  Spinal stenosis  Post-laminectomy syndrome  Herpes zoster (shingles) pain  Pain from compression fractures  Preparation for the injection:  1. Do not eat any solid food or dairy products within 8 hours of your appointment.  2. You may drink clear liquids up to 3 hours before appointment.  Clear liquids include water, black coffee, juice or soda.  No milk or cream please. 3. You may take your regular medication, including pain medications, with a sip of water before your appointment  Diabetics should hold regular insulin (if taken separately) and take 1/2 normal NPH dos the morning of the procedure.  Carry some sugar containing items with you to your appointment. 4. A driver must accompany you and be prepared to drive you home after your procedure.  5. Bring all your current medications with your. 6. An IV may be inserted and sedation may be given at the discretion of the physician.   7. A blood pressure cuff, EKG and other monitors will often be applied during the procedure.  Some patients may need to have extra oxygen administered for a short period. 8. You will be asked to provide medical information, including your allergies, prior to the procedure.  We must know immediately if you are taking blood thinners (like Coumadin/Warfarin)  Or if you are allergic to IV iodine contrast (dye). We must know if you could possible be pregnant.  Possible side-effects:  Bleeding from needle site  Infection (rare, may require surgery)  Nerve injury (rare)  Numbness & tingling (temporary)  Difficulty urinating (rare, temporary)  Spinal headache ( a headache worse with upright posture)  Light -headedness (temporary)  Pain at injection site (several days)  Decreased blood pressure (temporary)  Weakness in arm/leg (temporary)  Pressure sensation in back/neck (temporary)  Call if you experience:  Fever/chills associated with  headache or increased back/neck pain.  Headache worsened by an upright position.  New onset weakness or numbness of an extremity below the injection site  Hives or difficulty breathing (go to the emergency room)  Inflammation or drainage at the infection site  Severe back/neck pain  Any new symptoms which are concerning to you  Please note:  Although the local anesthetic injected can often make your back or neck feel good for several hours after the injection, the pain will likely return.  It takes 3-7 days for steroids to work in the epidural space.  You may not notice any pain relief for at least that one week.  If effective, we will often do a series of three injections spaced 3-6 weeks apart to maximally decrease your pain.  After the initial series, we generally will wait several months before considering a repeat injection of the same type.  If you have any questions, please call [phone] [facility] 

## 2020-06-22 ENCOUNTER — Telehealth: Payer: Self-pay | Admitting: *Deleted

## 2020-06-22 NOTE — Telephone Encounter (Signed)
Attempted to call for post procedure follow-up. Message left. 

## 2020-07-17 NOTE — Progress Notes (Deleted)
No show

## 2020-07-18 ENCOUNTER — Ambulatory Visit: Payer: Federal, State, Local not specified - PPO | Admitting: Pain Medicine

## 2020-08-24 ENCOUNTER — Ambulatory Visit: Payer: Federal, State, Local not specified - PPO | Admitting: Pain Medicine

## 2020-09-11 NOTE — Progress Notes (Signed)
No show

## 2020-09-12 ENCOUNTER — Encounter: Payer: Federal, State, Local not specified - PPO | Admitting: Pain Medicine

## 2021-06-27 NOTE — Progress Notes (Deleted)
No show

## 2021-06-28 ENCOUNTER — Ambulatory Visit: Payer: Federal, State, Local not specified - PPO | Admitting: Pain Medicine

## 2021-06-28 ENCOUNTER — Other Ambulatory Visit: Payer: Self-pay | Admitting: Pain Medicine

## 2021-06-28 DIAGNOSIS — M5417 Radiculopathy, lumbosacral region: Secondary | ICD-10-CM

## 2021-06-28 DIAGNOSIS — M5441 Lumbago with sciatica, right side: Secondary | ICD-10-CM

## 2021-06-28 DIAGNOSIS — R937 Abnormal findings on diagnostic imaging of other parts of musculoskeletal system: Secondary | ICD-10-CM

## 2021-06-28 DIAGNOSIS — G8929 Other chronic pain: Secondary | ICD-10-CM

## 2021-06-28 DIAGNOSIS — M5136 Other intervertebral disc degeneration, lumbar region: Secondary | ICD-10-CM

## 2021-06-28 DIAGNOSIS — M79604 Pain in right leg: Secondary | ICD-10-CM

## 2021-06-28 DIAGNOSIS — M5137 Other intervertebral disc degeneration, lumbosacral region: Secondary | ICD-10-CM

## 2021-06-28 DIAGNOSIS — G894 Chronic pain syndrome: Secondary | ICD-10-CM

## 2021-06-28 DIAGNOSIS — M4697 Unspecified inflammatory spondylopathy, lumbosacral region: Secondary | ICD-10-CM

## 2021-06-28 NOTE — Progress Notes (Signed)
(  06/28/2021) -the patient was scheduled to come in today but did not keep her appointment and did not call to cancel.  The patient had a right-sided L4-5 LESI on 06/21/2020 but did not follow-up.  Post-Procedure Evaluation  Procedure (06/21/2020):  Type: Therapeutic Inter-Laminar Epidural Steroid Injection  #1  Region: Lumbar Level: L4-5 Level. Laterality: Right-Sided Paramedial  Pre-procedure pain level: 8/10 Post-procedure: 0/10 (100% relief)  The patient seems to be very unreliable in keeping appointments and therefore we will be cautious in giving her another.

## 2021-12-07 IMAGING — CR DG CHEST 2V
2 series · 2 of 2 positions shown · non-contrast
Comparison: 03/03/2019

CLINICAL DATA: Shortness of breath and chest pain

EXAM:
CHEST - 2 VIEW

[chest pa]
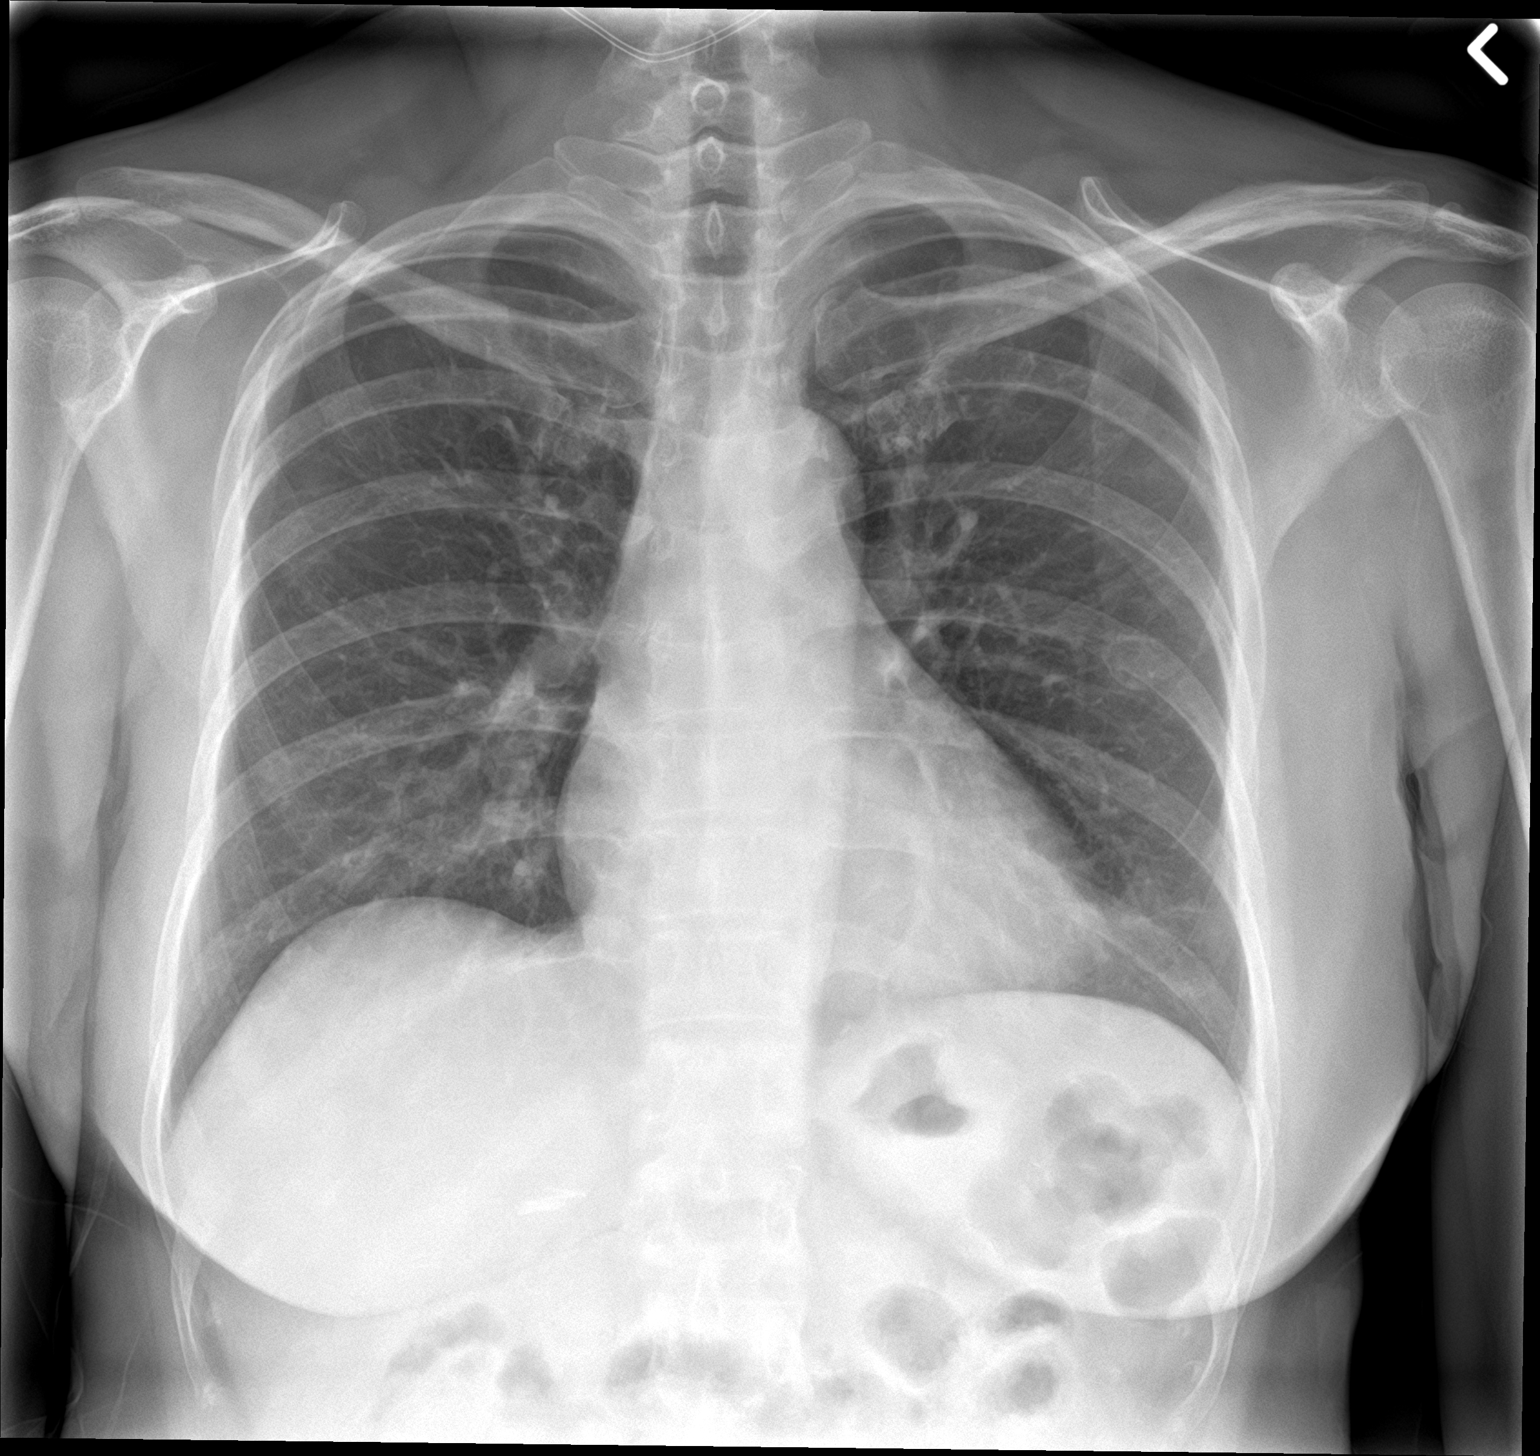

[chest lat]
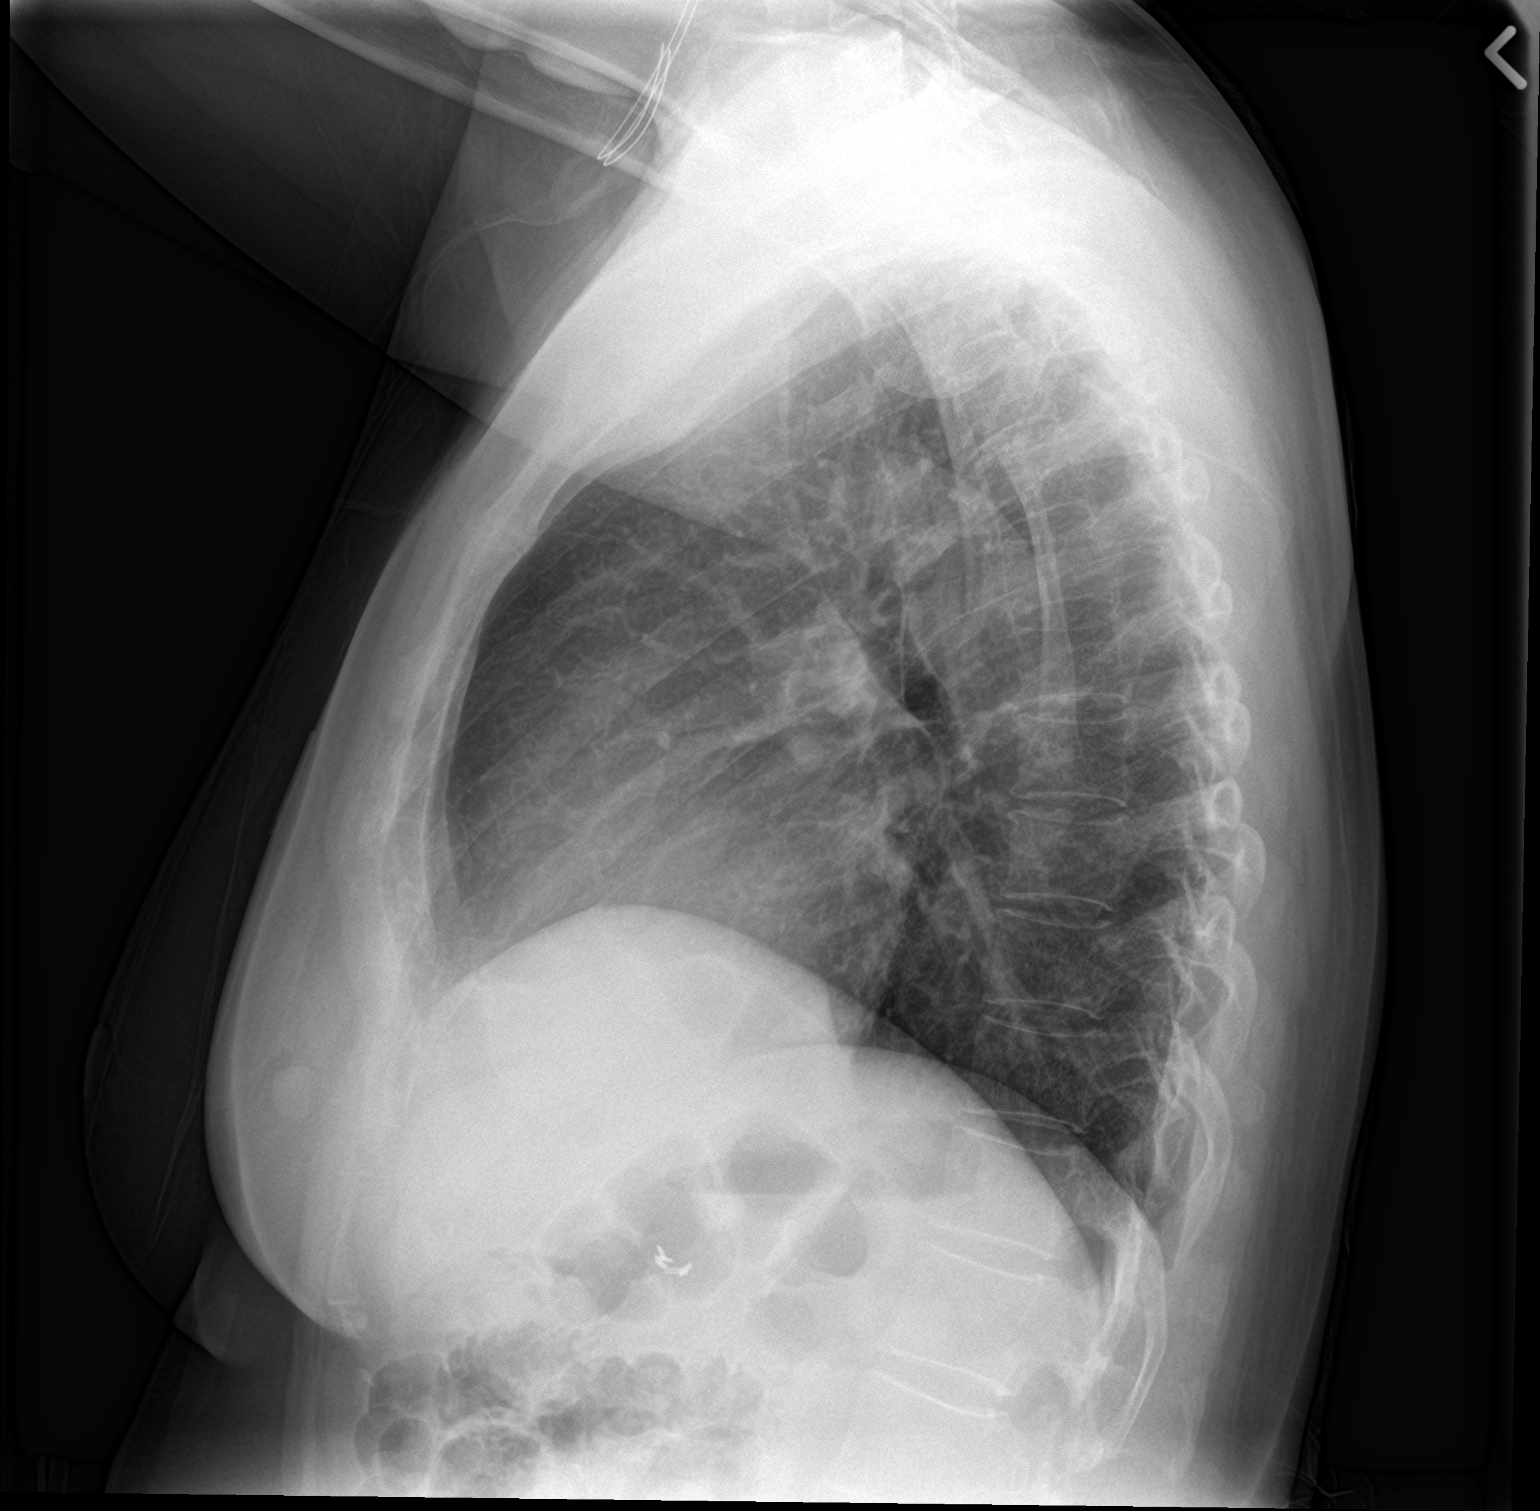

[2 of 2 positions shown; findings below may reference images not displayed]

FINDINGS: Cardiac shadows within normal limits. The lungs are well aerated
bilaterally. No focal infiltrate or sizable effusion is seen. No
bony abnormality is noted.
IMPRESSION: No acute abnormality seen.

## 2022-01-25 ENCOUNTER — Emergency Department: Payer: Federal, State, Local not specified - PPO

## 2022-01-25 ENCOUNTER — Emergency Department
Admission: EM | Admit: 2022-01-25 | Discharge: 2022-01-25 | Disposition: A | Discharge status: Home / Self Care | Payer: Federal, State, Local not specified - PPO | Attending: Emergency Medicine | Admitting: Emergency Medicine

## 2022-01-25 ENCOUNTER — Other Ambulatory Visit: Payer: Self-pay

## 2022-01-25 DIAGNOSIS — D72829 Elevated white blood cell count, unspecified: Secondary | ICD-10-CM | POA: Insufficient documentation

## 2022-01-25 DIAGNOSIS — K3189 Other diseases of stomach and duodenum: Secondary | ICD-10-CM | POA: Diagnosis not present

## 2022-01-25 DIAGNOSIS — R110 Nausea: Secondary | ICD-10-CM | POA: Diagnosis not present

## 2022-01-25 DIAGNOSIS — R1032 Left lower quadrant pain: Secondary | ICD-10-CM | POA: Insufficient documentation

## 2022-01-25 DIAGNOSIS — R1013 Epigastric pain: Secondary | ICD-10-CM | POA: Diagnosis present

## 2022-01-25 DIAGNOSIS — R519 Headache, unspecified: Secondary | ICD-10-CM | POA: Diagnosis not present

## 2022-01-25 DIAGNOSIS — R1031 Right lower quadrant pain: Secondary | ICD-10-CM | POA: Insufficient documentation

## 2022-01-25 LAB — COMPREHENSIVE METABOLIC PANEL WITH GFR
ALT: 30 U/L (ref 0–44)
AST: 31 U/L (ref 15–41)
Albumin: 4.1 g/dL (ref 3.5–5.0)
Alkaline Phosphatase: 95 U/L (ref 38–126)
Anion gap: 4 — ABNORMAL LOW (ref 5–15)
BUN: 21 mg/dL — ABNORMAL HIGH (ref 6–20)
CO2: 28 mmol/L (ref 22–32)
Calcium: 9.2 mg/dL (ref 8.9–10.3)
Chloride: 105 mmol/L (ref 98–111)
Creatinine, Ser: 0.71 mg/dL (ref 0.44–1.00)
GFR, Estimated: >60 mL/min
Glucose, Bld: 105 mg/dL — ABNORMAL HIGH (ref 70–99)
Potassium: 4.2 mmol/L (ref 3.5–5.1)
Sodium: 137 mmol/L (ref 135–145)
Total Bilirubin: 0.4 mg/dL (ref 0.3–1.2)
Total Protein: 7.8 g/dL (ref 6.5–8.1)

## 2022-01-25 LAB — CBC
HCT: 42 % (ref 36.0–46.0)
Hemoglobin: 13.3 g/dL (ref 12.0–15.0)
MCH: 27.7 pg (ref 26.0–34.0)
MCHC: 31.7 g/dL (ref 30.0–36.0)
MCV: 87.3 fL (ref 80.0–100.0)
Platelets: 254 10*3/uL (ref 150–400)
RBC: 4.81 MIL/uL (ref 3.87–5.11)
RDW: 13.2 % (ref 11.5–15.5)
WBC: 6 10*3/uL (ref 4.0–10.5)
nRBC: 0 % (ref 0.0–0.2)

## 2022-01-25 LAB — URINALYSIS, ROUTINE W REFLEX MICROSCOPIC
Bacteria, UA: NONE SEEN
Bilirubin Urine: NEGATIVE
Glucose, UA: NEGATIVE mg/dL
Hgb urine dipstick: NEGATIVE
Ketones, ur: NEGATIVE mg/dL
Nitrite: NEGATIVE
Protein, ur: NEGATIVE mg/dL
Specific Gravity, Urine: 1.016 (ref 1.005–1.030)
pH: 6 (ref 5.0–8.0)

## 2022-01-25 LAB — LIPASE, BLOOD: Lipase: 43 U/L (ref 11–51)

## 2022-01-25 MED ORDER — ACETAMINOPHEN 500 MG PO TABS
1000.0000 mg | ORAL_TABLET | Freq: Once | ORAL | Status: AC
  Administered 2022-01-25: 1000 mg via ORAL
  Filled 2022-01-25: qty 2

## 2022-01-25 MED ORDER — ONDANSETRON HCL 4 MG/2ML IJ SOLN
4.0000 mg | Freq: Once | INTRAMUSCULAR | Status: AC
  Administered 2022-01-25: 4 mg via INTRAVENOUS
  Filled 2022-01-25: qty 2

## 2022-01-25 MED ORDER — PANTOPRAZOLE SODIUM 40 MG PO TBEC: 40.0000 mg | DELAYED_RELEASE_TABLET | Freq: Every day | ORAL | 0 refills | Status: DC

## 2022-01-25 MED ORDER — SUCRALFATE 1 G PO TABS: 1.0000 g | ORAL_TABLET | Freq: Four times a day (QID) | ORAL | 11 refills | Status: AC

## 2022-01-25 MED ORDER — IOHEXOL 300 MG/ML  SOLN
75.0000 mL | Freq: Once | INTRAMUSCULAR | Status: AC | PRN
  Administered 2022-01-25: 75 mL via INTRAVENOUS
  Filled 2022-01-25: qty 75

## 2022-01-25 NOTE — ED Triage Notes (Signed)
Patient to ER from urgent care with complaints of epigastric pain x2 days. Pain radiates into back. Reports pain is worse after eating. Denies NVD. Denies urinary symptoms.   Reports history of the same, had cholecystectomy. Hx of kidney stones.

## 2022-01-25 NOTE — ED Provider Notes (Signed)
[facility] Provider Note    Event Date/Time   First MD Initiated Contact with Patient 01/25/22 1620     (approximate)   History   Chief Complaint Abdominal Pain   HPI Gail [name] is a 51 y.o. female, history of nephrolithiasis, AUB, anemia, pancreatitis, presents the emergency department for evaluation of abdominal pain.  Patient states that the pain has been going on for the past 2 days.  Reports pain in her epigastrium, as well as in her lower right and lower left quadrants.  Reports pain as 7/10, sharp.  Associated with mild nausea and headache.  Patient states the pain is worse after eating.  She said that she visited urgent care yesterday, but they did not do any further imaging or prescribe her any medications.  Denies chest pain, shortness of breath, vomiting, diarrhea, urinary symptoms, rashes/lesions, dysphagia, blood in stools, weakness/fatigue, or fever/chills  History Limitations: No limitations      Physical Exam  Triage Vital Signs: ED Triage Vitals [01/25/22 1613]  Enc Vitals Group     BP (!) 147/87     Pulse Rate 68     Resp 17     Temp 98.2 F (36.8 C)     Temp Source Oral     SpO2 99 %     Weight 117 lb (53.1 kg)     Height 5\' 2"  (1.575 m)     Head Circumference      Peak Flow      Pain Score 7     Pain Loc      Pain Edu?      Excl. in [place]?     Most recent vital signs: Vitals:   01/25/22 1613  BP: (!) 147/87  Pulse: 68  Resp: 17  Temp: 98.2 F (36.8 C)  SpO2: 99%    General: Awake, NAD.  CV: Good peripheral perfusion.  Resp: Normal effort.  Abd: Soft, no distention.  Mild tenderness when palpating the epigastrium, as well as the right lower quadrant and left lower quadrant. Neuro: At baseline. No gross neurological deficits. Other: No CVA tenderness  Physical Exam    ED Results / Procedures / Treatments  Labs (all labs ordered are listed, but only abnormal results are displayed) Labs Reviewed   COMPREHENSIVE METABOLIC PANEL - Abnormal; Notable for the following components:      Result Value   Glucose, Bld 105 (*)    BUN 21 (*)    Anion gap 4 (*)    All other components within normal limits  URINALYSIS, ROUTINE W REFLEX MICROSCOPIC - Abnormal; Notable for the following components:   Color, Urine YELLOW (*)    APPearance CLEAR (*)    Leukocytes,Ua SMALL (*)    All other components within normal limits  LIPASE, BLOOD  CBC     EKG Sinus rhythm, rate of 68, no significant ST segment changes, no axis deviations, no AV blocks.    RADIOLOGY  ED Provider Interpretation: I personally reviewed and interpreted the CT.  No acute findings noted  CT Abdomen Pelvis W Contrast  Result Date: 01/25/2022 CLINICAL DATA:  Abdominal pain, acute nonlocalized. Epigastric pain for 2 days. EXAM: CT ABDOMEN AND PELVIS WITH CONTRAST TECHNIQUE: Multidetector CT imaging of the abdomen and pelvis was performed using the standard protocol following bolus administration of intravenous contrast. RADIATION DOSE REDUCTION: This exam was performed according to the departmental dose-optimization program which includes automated exposure control, adjustment of the mA and/or kV  according to patient size and/or use of iterative reconstruction technique. CONTRAST:  37mL OMNIPAQUE IOHEXOL 300 MG/ML  SOLN COMPARISON:  MRI examination dated August 06, 2019 FINDINGS: Lower chest: No acute abnormality. Hepatobiliary: No focal liver abnormality is seen. Status post cholecystectomy. No biliary dilatation. Pancreas: Pancreatic atrophy. No pancreatic ductal dilatation or surrounding inflammatory changes. Spleen: Normal in size without focal abnormality. Adrenals/Urinary Tract: Adrenal glands are unremarkable. Kidneys are normal, without renal calculi, focal lesion, or hydronephrosis. Bladder is unremarkable. Stomach/Bowel: Stomach is not well distended with prominence of the walls, which may be secondary to under distention.  Bowel loops are normal in caliber. Appendix appears normal. No evidence of bowel wall thickening, distention, or inflammatory changes. Vascular/Lymphatic: Aortic atherosclerosis. No enlarged abdominal or pelvic lymph nodes. Reproductive: Status post hysterectomy. No adnexal masses. Other: No abdominal wall hernia or abnormality. No abdominopelvic ascites. Musculoskeletal: No acute or significant osseous findings. IMPRESSION: 1.  No CT evidence of acute abdominal/pelvic process. 2.  Status post cholecystectomy and hysterectomy. 3. Stomach is not well distended with mild wall thickening in the gastric antrum, which may be secondary to mild gastritis or underdistention. 4. Bowel loops are normal in caliber without evidence of colitis or diverticulitis. Electronically Signed   By: Keane Police D.O.   On: 01/25/2022 17:36    PROCEDURES:  Critical Care performed: None.  Procedures    MEDICATIONS ORDERED IN ED: Medications  acetaminophen (TYLENOL) tablet 1,000 mg (1,000 mg Oral Given 01/25/22 1701)  ondansetron (ZOFRAN) injection 4 mg (4 mg Intravenous Given 01/25/22 1655)  iohexol (OMNIPAQUE) 300 MG/ML solution 75 mL (75 mLs Intravenous Contrast Given 01/25/22 1711)     IMPRESSION / MDM / ASSESSMENT AND PLAN / ED COURSE  I reviewed the triage vital signs and the nursing notes.                              Gail [name] is a 51 y.o. female, history of nephrolithiasis, AUB, anemia, pancreatitis, presents the emergency department for evaluation of abdominal pain.  Patient states that the pain has been going on for the past 2 days.  Reports pain in her epigastrium, as well as in her lower right and lower left quadrants.  Reports pain as 7/10, sharp.  Associated with mild nausea and headache.  Patient states the pain is worse after eating.  She said that she visited urgent care yesterday, but they did not do any further imaging or prescribe her any medications.  Differential diagnosis includes, but  is not limited to, peptic ulcers, pancreatitis, appendicitis, diverticulitis, colitis, GERD.  ED Course Patient appears well.  Vital signs within normal limits.  NAD.  Currently afebrile.  CBC unremarkable for leukocytosis or anemia.  Lipase unremarkable at 43.  Unlikely pancreatitis.  CMP shows no evidence of significant electrolyte abnormalities, transaminitis, or evidence of kidney injury.  CT abdomen pelvis shows no evidence of acute abdominal/pelvic process.  Mild wall thickening in the gastric antrum, possibly suggestive of gastritis.   Urinalysis notable for small leukocytes, though in the absence of urinary symptoms, unlikely urinary tract infection.   Assessment/Plan Given the patient's history, physical exam, and work-up, I do not suspect any serious or life-threatening pathology.  Signs and symptoms are consistent with peptic ulcers versus gastritis.  We will go ahead and prescribe this patient pantoprazole and sucralfate.  Encouraged her to have a bland diet and follow-up with her primary care provider as needed if symptoms  do not resolve after 4 weeks.  We will plan to discharge this patient.  Patient was provided with anticipatory guidance, return precautions, and educational material. Encouraged the patient to return to the emergency department at any time if they begin to experience any new or worsening symptoms.       FINAL CLINICAL IMPRESSION(S) / ED DIAGNOSES   Final diagnoses:  Epigastric pain     Rx / DC Orders   ED Discharge Orders          Ordered    pantoprazole (PROTONIX) 40 MG tablet  Daily        01/25/22 1815    sucralfate (CARAFATE) 1 g tablet  4 times daily        01/25/22 1815             Note:  This document was prepared using Dragon voice recognition software and may include unintentional dictation errors.   Teodoro Spray, [place] 01/25/22 1946    Nena Polio, MD 01/25/22 [phone]

## 2022-01-25 NOTE — ED Notes (Signed)
Pt to ED for abdominal pain since 2 days, feels bloated, LBM this morning, pain level 6/10.

## 2022-01-25 NOTE — Discharge Instructions (Addendum)
-  Take pantoprazole and sucralfate as prescribed. -Follow-up with your primary care provider if symptoms do not resolve after 4 weeks. -Return to the emergency department at any time if you begin to experience any new or worsening symptoms.

## 2023-09-06 ENCOUNTER — Emergency Department
Admission: EM | Admit: 2023-09-06 | Discharge: 2023-09-06 | Disposition: A | Discharge status: Home / Self Care | Payer: Federal, State, Local not specified - PPO | Attending: Emergency Medicine | Admitting: Emergency Medicine

## 2023-09-06 ENCOUNTER — Emergency Department: Payer: Federal, State, Local not specified - PPO

## 2023-09-06 ENCOUNTER — Other Ambulatory Visit: Payer: Self-pay

## 2023-09-06 DIAGNOSIS — R1011 Right upper quadrant pain: Secondary | ICD-10-CM | POA: Diagnosis not present

## 2023-09-06 DIAGNOSIS — R1010 Upper abdominal pain, unspecified: Secondary | ICD-10-CM

## 2023-09-06 DIAGNOSIS — R1013 Epigastric pain: Secondary | ICD-10-CM | POA: Diagnosis present

## 2023-09-06 LAB — URINALYSIS, ROUTINE W REFLEX MICROSCOPIC
Bilirubin Urine: NEGATIVE
Glucose, UA: NEGATIVE mg/dL
Hgb urine dipstick: NEGATIVE
Ketones, ur: NEGATIVE mg/dL
Leukocytes,Ua: NEGATIVE
Nitrite: NEGATIVE
Protein, ur: NEGATIVE mg/dL
Specific Gravity, Urine: 1.006 (ref 1.005–1.030)
pH: 7 (ref 5.0–8.0)

## 2023-09-06 LAB — COMPREHENSIVE METABOLIC PANEL
ALT: 20 U/L (ref 0–44)
AST: 24 U/L (ref 15–41)
Albumin: 4.3 g/dL (ref 3.5–5.0)
Alkaline Phosphatase: 79 U/L (ref 38–126)
Anion gap: 9 (ref 5–15)
BUN: 20 mg/dL (ref 6–20)
CO2: 29 mmol/L (ref 22–32)
Calcium: 9.2 mg/dL (ref 8.9–10.3)
Chloride: 102 mmol/L (ref 98–111)
Creatinine, Ser: 0.81 mg/dL (ref 0.44–1.00)
GFR, Estimated: >60 mL/min (ref >60)
Glucose, Bld: 117 mg/dL — ABNORMAL HIGH (ref 70–99)
Potassium: 3.3 mmol/L — ABNORMAL LOW (ref 3.5–5.1)
Sodium: 140 mmol/L (ref 135–145)
Total Bilirubin: 0.5 mg/dL (ref 0.3–1.2)
Total Protein: 7.7 g/dL (ref 6.5–8.1)

## 2023-09-06 LAB — CBC
HCT: 39.5 % (ref 36.0–46.0)
Hemoglobin: 12.8 g/dL (ref 12.0–15.0)
MCH: 28.1 pg (ref 26.0–34.0)
MCHC: 32.4 g/dL (ref 30.0–36.0)
MCV: 86.8 fL (ref 80.0–100.0)
Platelets: 244 10*3/uL (ref 150–400)
RBC: 4.55 MIL/uL (ref 3.87–5.11)
RDW: 13 % (ref 11.5–15.5)
WBC: 5.3 10*3/uL (ref 4.0–10.5)
nRBC: 0 % (ref 0.0–0.2)

## 2023-09-06 LAB — LIPASE, BLOOD: Lipase: 40 U/L (ref 11–51)

## 2023-09-06 MED ORDER — SUCRALFATE 1 G PO TABS: 1.0000 g | ORAL_TABLET | Freq: Three times a day (TID) | ORAL | 0 refills | Status: AC

## 2023-09-06 MED ORDER — ALUM & MAG HYDROXIDE-SIMETH 200-200-20 MG/5ML PO SUSP
30.0000 mL | Freq: Once | ORAL | Status: AC
  Administered 2023-09-06: 30 mL via ORAL
  Filled 2023-09-06: qty 30

## 2023-09-06 MED ORDER — OMEPRAZOLE MAGNESIUM 20 MG PO TBEC: 20.0000 mg | DELAYED_RELEASE_TABLET | Freq: Every day | ORAL | 0 refills | Status: DC

## 2023-09-06 NOTE — ED Triage Notes (Signed)
Pt c/o acid reflux, epigastric bloating and pain x 2 weeks. Pt states she has a history of peptic ulcers. Pt is AOX4, NAD. Pt denies SHOB, CP, dizziness, N/V. Not diabetic, skin warm and dry.

## 2023-09-06 NOTE — ED Provider Notes (Signed)
[facility] Provider Note    Event Date/Time   First MD Initiated Contact with Patient 09/06/23 2102     (approximate)   History   Abdominal Pain   HPI  Gail [name] is a 52 year old female presenting to the emergency department for evaluation of epigastric pain.  Patient reports that for the past 2 weeks she has experienced acid reflux with epigastric discomfort worse when she is eating.  Does report a history of peptic ulcers.  No chest pain, shortness of breath, lower abdominal pain.  No vomiting or diarrhea.  Multiple prior abdominal surgeries.    Physical Exam   Triage Vital Signs: ED Triage Vitals  Encounter Vitals Group     BP 09/06/23 2041 (!) 193/76     Systolic BP Percentile --      Diastolic BP Percentile --      Pulse Rate 09/06/23 2041 64     Resp 09/06/23 2041 18     Temp 09/06/23 2041 98.4 F (36.9 C)     Temp src --      SpO2 09/06/23 2041 98 %     Weight --      Height --      Head Circumference --      Peak Flow --      Pain Score 09/06/23 2043 7     Pain Loc --      Pain Education --      Exclude from Growth Chart --     Most recent vital signs: Vitals:   09/06/23 2230 09/06/23 2300  BP: (!) 162/103 (!) 168/64  Pulse: 63 60  Resp:  17  Temp:    SpO2: 98% 100%     General: Awake, interactive  CV:  Regular rate, good peripheral perfusion.  Resp:  Lungs clear, unlabored respirations.  Abd:  Soft, nondistended, tenderness palpation in the epigastric area extending somewhat into the right upper quadrant without rebound or guarding Neuro:  Symmetric facial movement, fluid speech   ED Results / Procedures / Treatments   Labs (all labs ordered are listed, but only abnormal results are displayed) Labs Reviewed  COMPREHENSIVE METABOLIC PANEL - Abnormal; Notable for the following components:      Result Value   Potassium 3.3 (*)    Glucose, Bld 117 (*)    All other components within normal limits   URINALYSIS, ROUTINE W REFLEX MICROSCOPIC - Abnormal; Notable for the following components:   Color, Urine STRAW (*)    APPearance CLEAR (*)    All other components within normal limits  LIPASE, BLOOD  CBC     EKG EKG independently reviewed interpreted by myself (ER attending) demonstrates:  EKG demonstrates normal sinus rhythm rate of 70, PR 154, QRS 82, QTc 434, no acute ST changes  RADIOLOGY Imaging independently reviewed and interpreted by myself demonstrates:  RUQ US demonstrates cholecystectomy without other acute findings  PROCEDURES:  Critical Care performed: No  Procedures   MEDICATIONS ORDERED IN ED: Medications  alum & mag hydroxide-simeth (MAALOX/MYLANTA) 200-200-20 MG/5ML suspension 30 mL (30 mLs Oral Given 09/06/23 2119)     IMPRESSION / MDM / ASSESSMENT AND PLAN / ED COURSE  I reviewed the triage vital signs and the nursing notes.  Differential diagnosis includes, but is not limited to, gastritis, peptic ulcer disease, biliary pathology, pancreatitis, lower suspicion for other acute intra-abdominal process given localized pain in the epigastric area  Patient's presentation is most consistent with acute presentation with potential  threat to life or bodily function.  52 year old female presenting to the emergency department for evaluation of epigastric pain.  Labs overall reassuring.  Right upper quadrant ultrasound reassuring.  Patient reevaluated.  Reports a mild ongoing discomfort, but is comfortable with discharge home and outpatient follow-up.  Reports that she is currently taking Zantac, did recommend that she transition to a PPI to see if this improves her pain and will also DC with a prescription for sucralfate.  Strict return precautions provided.  Patient discharged stable condition.     FINAL CLINICAL IMPRESSION(S) / ED DIAGNOSES   Final diagnoses:  Upper abdominal pain     Rx / DC Orders   ED Discharge Orders          Ordered    omeprazole  (PRILOSEC OTC) 20 MG tablet  Daily        09/06/23 2322    sucralfate (CARAFATE) 1 g tablet  3 times daily with meals & bedtime        09/06/23 2322             Note:  This document was prepared using Dragon voice recognition software and may include unintentional dictation errors.   Trinna Post, MD 09/06/23 [phone]

## 2023-09-06 NOTE — Discharge Instructions (Signed)
You were seen in the Emergency Department today for evaluation of your abdominal pain. Fortunately, your exam and labs were reassuring against an emergency cause for your pain. Please follow-up with your primary care doctor within the next few days for reevaluation.  I sent a prescription for a PPI called omeprazole as well as sucralfate to your pharmacy.  Please do not take your Zantac while you are taking the PPI.  Return to the ER for any new or worsening symptoms including worsening pain, inability to tolerate food or liquids, or any other new or concerning symptoms.

## 2023-10-08 ENCOUNTER — Encounter: Payer: Self-pay | Admitting: Emergency Medicine

## 2023-10-08 ENCOUNTER — Other Ambulatory Visit: Payer: Self-pay

## 2023-10-08 ENCOUNTER — Emergency Department
Admission: EM | Admit: 2023-10-08 | Discharge: 2023-10-08 | Disposition: A | Discharge status: Home / Self Care | Payer: Federal, State, Local not specified - PPO | Attending: Emergency Medicine | Admitting: Emergency Medicine

## 2023-10-08 ENCOUNTER — Emergency Department: Payer: Federal, State, Local not specified - PPO

## 2023-10-08 DIAGNOSIS — R109 Unspecified abdominal pain: Secondary | ICD-10-CM

## 2023-10-08 DIAGNOSIS — R42 Dizziness and giddiness: Secondary | ICD-10-CM

## 2023-10-08 DIAGNOSIS — Z20822 Contact with and (suspected) exposure to covid-19: Secondary | ICD-10-CM | POA: Diagnosis not present

## 2023-10-08 DIAGNOSIS — R1031 Right lower quadrant pain: Secondary | ICD-10-CM | POA: Insufficient documentation

## 2023-10-08 DIAGNOSIS — R112 Nausea with vomiting, unspecified: Secondary | ICD-10-CM

## 2023-10-08 DIAGNOSIS — R1013 Epigastric pain: Secondary | ICD-10-CM | POA: Insufficient documentation

## 2023-10-08 LAB — URINALYSIS, ROUTINE W REFLEX MICROSCOPIC
Bilirubin Urine: NEGATIVE
Glucose, UA: NEGATIVE mg/dL
Hgb urine dipstick: NEGATIVE
Ketones, ur: 5 mg/dL — AB
Leukocytes,Ua: NEGATIVE
Nitrite: NEGATIVE
Protein, ur: NEGATIVE mg/dL
Specific Gravity, Urine: >1.046 — ABNORMAL HIGH (ref 1.005–1.030)
pH: 6 (ref 5.0–8.0)

## 2023-10-08 LAB — CBC
HCT: 41.5 % (ref 36.0–46.0)
Hemoglobin: 13.7 g/dL (ref 12.0–15.0)
MCH: 28.5 pg (ref 26.0–34.0)
MCHC: 33 g/dL (ref 30.0–36.0)
MCV: 86.5 fL (ref 80.0–100.0)
Platelets: 262 10*3/uL (ref 150–400)
RBC: 4.8 MIL/uL (ref 3.87–5.11)
RDW: 12.6 % (ref 11.5–15.5)
WBC: 7.7 10*3/uL (ref 4.0–10.5)
nRBC: 0 % (ref 0.0–0.2)

## 2023-10-08 LAB — COMPREHENSIVE METABOLIC PANEL
ALT: 24 U/L (ref 0–44)
AST: 28 U/L (ref 15–41)
Albumin: 4.4 g/dL (ref 3.5–5.0)
Alkaline Phosphatase: 91 U/L (ref 38–126)
Anion gap: 11 (ref 5–15)
BUN: 21 mg/dL — ABNORMAL HIGH (ref 6–20)
CO2: 24 mmol/L (ref 22–32)
Calcium: 9.5 mg/dL (ref 8.9–10.3)
Chloride: 104 mmol/L (ref 98–111)
Creatinine, Ser: 0.65 mg/dL (ref 0.44–1.00)
GFR, Estimated: >60 mL/min (ref >60)
Glucose, Bld: 112 mg/dL — ABNORMAL HIGH (ref 70–99)
Potassium: 3.7 mmol/L (ref 3.5–5.1)
Sodium: 139 mmol/L (ref 135–145)
Total Bilirubin: 0.6 mg/dL (ref <1.2)
Total Protein: 7.7 g/dL (ref 6.5–8.1)

## 2023-10-08 LAB — LIPASE, BLOOD: Lipase: 33 U/L (ref 11–51)

## 2023-10-08 LAB — SARS CORONAVIRUS 2 BY RT PCR: SARS Coronavirus 2 by RT PCR: NEGATIVE

## 2023-10-08 MED ORDER — PANTOPRAZOLE SODIUM 40 MG IV SOLR
40.0000 mg | Freq: Once | INTRAVENOUS | Status: AC
  Administered 2023-10-08: 40 mg via INTRAVENOUS
  Filled 2023-10-08: qty 10

## 2023-10-08 MED ORDER — ONDANSETRON HCL 4 MG/2ML IJ SOLN
4.0000 mg | Freq: Once | INTRAMUSCULAR | Status: AC
  Administered 2023-10-08: 4 mg via INTRAVENOUS
  Filled 2023-10-08: qty 2

## 2023-10-08 MED ORDER — MORPHINE SULFATE (PF) 4 MG/ML IV SOLN
4.0000 mg | Freq: Once | INTRAVENOUS | Status: AC
  Administered 2023-10-08: 4 mg via INTRAVENOUS
  Filled 2023-10-08: qty 1

## 2023-10-08 MED ORDER — SODIUM CHLORIDE 0.9 % IV BOLUS
500.0000 mL | Freq: Once | INTRAVENOUS | Status: AC
  Administered 2023-10-08: 500 mL via INTRAVENOUS

## 2023-10-08 MED ORDER — PANTOPRAZOLE SODIUM 40 MG PO TBEC: 40.0000 mg | DELAYED_RELEASE_TABLET | Freq: Every day | ORAL | 1 refills | Status: AC

## 2023-10-08 MED ORDER — IOHEXOL 300 MG/ML  SOLN
100.0000 mL | Freq: Once | INTRAMUSCULAR | Status: AC | PRN
  Administered 2023-10-08: 100 mL via INTRAVENOUS

## 2023-10-08 NOTE — ED Provider Notes (Signed)
[facility] Provider Note    Event Date/Time   First MD Initiated Contact with Patient 10/08/23 1658     (approximate)   History   Emesis   HPI  Corie LEESA LEIFHEIT is a 52 y.o. female with history of kidney stones, syncope, vertigo, anemia presents emergency department complaint of dizziness abdominal pain and vomiting that started today while she was at work.  Multiple episodes of vomiting.  States does have a history of vertigo and feels like dizziness may be similar.  However the abdominal pain is different.  Patient has had a cholecystectomy.  But does still have her appendix.      Physical Exam   Triage Vital Signs: ED Triage Vitals  Encounter Vitals Group     BP 10/08/23 1550 123/81     Systolic BP Percentile --      Diastolic BP Percentile --      Pulse Rate 10/08/23 1550 88     Resp 10/08/23 1550 18     Temp 10/08/23 1550 98.3 F (36.8 C)     Temp Source 10/08/23 1550 Oral     SpO2 10/08/23 1550 97 %     Weight 10/08/23 1551 118 lb (53.5 kg)     Height 10/08/23 1551 5\' 2"  (1.575 m)     Head Circumference --      Peak Flow --      Pain Score 10/08/23 1550 6     Pain Loc --      Pain Education --      Exclude from Growth Chart --     Most recent vital signs: Vitals:   10/08/23 1550  BP: 123/81  Pulse: 88  Resp: 18  Temp: 98.3 F (36.8 C)  SpO2: 97%     General: Awake, no distress.   CV:  Good peripheral perfusion. regular rate and  rhythm Resp:  Normal effort. Lungs cta Abd:  No distention.  Tender in the epigastric, tender in the right lower quadrant, bowel sounds normal all 4 quads Other:      ED Results / Procedures / Treatments   Labs (all labs ordered are listed, but only abnormal results are displayed) Labs Reviewed  COMPREHENSIVE METABOLIC PANEL - Abnormal; Notable for the following components:      Result Value   Glucose, Bld 112 (*)    BUN 21 (*)    All other components within normal limits  SARS  CORONAVIRUS 2 BY RT PCR  LIPASE, BLOOD  CBC  URINALYSIS, ROUTINE W REFLEX MICROSCOPIC     EKG     RADIOLOGY CT abdomen pelvis IV contrast    PROCEDURES:   Procedures   MEDICATIONS ORDERED IN ED: Medications  sodium chloride 0.9 % bolus 500 mL (500 mLs Intravenous New Bag/Given 10/08/23 1824)  ondansetron (ZOFRAN) injection 4 mg (4 mg Intravenous Given 10/08/23 1824)  morphine (PF) 4 MG/ML injection 4 mg (4 mg Intravenous Given 10/08/23 1825)  pantoprazole (PROTONIX) injection 40 mg (40 mg Intravenous Given 10/08/23 1824)  iohexol (OMNIPAQUE) 300 MG/ML solution 100 mL (100 mLs Intravenous Contrast Given 10/08/23 1806)     IMPRESSION / MDM / ASSESSMENT AND PLAN / ED COURSE  I reviewed the triage vital signs and the nursing notes.                              Differential diagnosis includes, but is not limited to, vertigo, SBO, PUD,  acute appendicitis, colitis, UTI, pyelonephritis, kidney stone, COVID  Patient's presentation is most consistent with acute illness / injury with system symptoms.   Due to the dizziness along with vomiting we will give the patient normal saline 500 mL IV, Zofran 4 mg IV, morphine 4 mg IV, and Protonix IV.  CT abdomen pelvis IV contrast to rule out appendicitis and/or bowel obstruction   Respiratory panel for COVID due to vomiting which has been present at multiple patient's with abdominal pain and vomiting.  CBC metabolic panel and lipase are all reassuring, awaiting COVID and UA  EKG shows normal sinus rhythm, no change from previous EKG, see physician read  Care transferred to Dr. Larinda Buttery at shift change.  Plan is to assess CT.  On recheck of the patient she does appear very comfortable.  Fluids are still running.  Awaiting UA and COVID swab  FINAL CLINICAL IMPRESSION(S) / ED DIAGNOSES   Final diagnoses:  Abdominal pain, unspecified abdominal location  Nausea and vomiting, unspecified vomiting type  Vertigo     Rx / DC Orders   ED  Discharge Orders     None        Note:  This document was prepared using Dragon voice recognition software and may include unintentional dictation errors.    Faythe Ghee, PA-C 10/08/23 D[place]ell Level    Chesley Noon, MD 10/08/23 2329

## 2023-10-08 NOTE — ED Triage Notes (Signed)
Patient to ED via POV for vomiting and dizziness. Started today while at work around 1300. Mid abd pain- started after she vomited.

## 2023-10-08 NOTE — ED Provider Notes (Signed)
-----------------------------------------   7:07 PM on 10/08/2023 -----------------------------------------  Blood pressure 123/81, pulse 88, temperature 98.3 F (36.8 C), temperature source Oral, resp. rate 18, height 5\' 2"  (1.575 m), weight 53.5 kg, last menstrual period 04/01/2015, SpO2 97%.  Assuming care from PA Fisher.  In short, Gail [name] is a 52 y.o. female with a chief complaint of Emesis .  Refer to the original H&P for additional details.  The current plan of care is to follow-up CT abdomen/pelvis.  ----------------------------------------- 9:15 PM on 10/08/2023 ----------------------------------------- CT imaging shows possible gastritis but is otherwise unremarkable.  Patient feeling better on reassessment and she is appropriate for discharge home with PCP follow-up.  She was counseled to return to the ED for new or worsening symptoms, patient agrees with plan.    Chesley Noon, MD 10/08/23 2115
